# Patient Record
Sex: Female | Born: 1935 | Race: Black or African American | Hispanic: No | Smoking: Current some day smoker
Health system: Southern US, Community
[De-identification: ages and names within clinical notes are randomized; demographics above are authoritative.]

## PROBLEM LIST (undated history)

## (undated) DIAGNOSIS — F32A Depression, unspecified: Secondary | ICD-10-CM

## (undated) DIAGNOSIS — R600 Localized edema: Secondary | ICD-10-CM

## (undated) DIAGNOSIS — M1990 Unspecified osteoarthritis, unspecified site: Secondary | ICD-10-CM

## (undated) DIAGNOSIS — F329 Major depressive disorder, single episode, unspecified: Secondary | ICD-10-CM

## (undated) DIAGNOSIS — M51379 Other intervertebral disc degeneration, lumbosacral region without mention of lumbar back pain or lower extremity pain: Secondary | ICD-10-CM

## (undated) DIAGNOSIS — E039 Hypothyroidism, unspecified: Secondary | ICD-10-CM

## (undated) DIAGNOSIS — J449 Chronic obstructive pulmonary disease, unspecified: Secondary | ICD-10-CM

## (undated) DIAGNOSIS — I2510 Atherosclerotic heart disease of native coronary artery without angina pectoris: Secondary | ICD-10-CM

## (undated) DIAGNOSIS — F419 Anxiety disorder, unspecified: Secondary | ICD-10-CM

## (undated) DIAGNOSIS — F410 Panic disorder [episodic paroxysmal anxiety] without agoraphobia: Secondary | ICD-10-CM

## (undated) DIAGNOSIS — R911 Solitary pulmonary nodule: Secondary | ICD-10-CM

## (undated) DIAGNOSIS — I219 Acute myocardial infarction, unspecified: Secondary | ICD-10-CM

## (undated) DIAGNOSIS — I729 Aneurysm of unspecified site: Secondary | ICD-10-CM

## (undated) DIAGNOSIS — M5137 Other intervertebral disc degeneration, lumbosacral region: Secondary | ICD-10-CM

## (undated) DIAGNOSIS — E669 Obesity, unspecified: Secondary | ICD-10-CM

## (undated) DIAGNOSIS — L89109 Pressure ulcer of unspecified part of back, unspecified stage: Secondary | ICD-10-CM

## (undated) DIAGNOSIS — Z720 Tobacco use: Secondary | ICD-10-CM

## (undated) DIAGNOSIS — I10 Essential (primary) hypertension: Secondary | ICD-10-CM

## (undated) DIAGNOSIS — R569 Unspecified convulsions: Secondary | ICD-10-CM

## (undated) DIAGNOSIS — M6740 Ganglion, unspecified site: Secondary | ICD-10-CM

## (undated) DIAGNOSIS — G40909 Epilepsy, unspecified, not intractable, without status epilepticus: Secondary | ICD-10-CM

## (undated) HISTORY — DX: Major depressive disorder, single episode, unspecified: F32.9

## (undated) HISTORY — DX: Other intervertebral disc degeneration, lumbosacral region: M51.37

## (undated) HISTORY — DX: Obesity, unspecified: E66.9

## (undated) HISTORY — DX: Ganglion, unspecified site: M67.40

## (undated) HISTORY — DX: Localized edema: R60.0

## (undated) HISTORY — DX: Panic disorder (episodic paroxysmal anxiety): F41.0

## (undated) HISTORY — DX: Essential (primary) hypertension: I10

## (undated) HISTORY — DX: Depression, unspecified: F32.A

## (undated) HISTORY — PX: CHOLECYSTECTOMY: SHX55

## (undated) HISTORY — DX: Chronic obstructive pulmonary disease, unspecified: J44.9

## (undated) HISTORY — DX: Atherosclerotic heart disease of native coronary artery without angina pectoris: I25.10

## (undated) HISTORY — DX: Epilepsy, unspecified, not intractable, without status epilepticus: G40.909

## (undated) HISTORY — DX: Acute myocardial infarction, unspecified: I21.9

## (undated) HISTORY — DX: Other intervertebral disc degeneration, lumbosacral region without mention of lumbar back pain or lower extremity pain: M51.379

## (undated) HISTORY — DX: Unspecified osteoarthritis, unspecified site: M19.90

## (undated) HISTORY — PX: APPENDECTOMY: SHX54

## (undated) HISTORY — PX: ANGIOPLASTY: SHX39

## (undated) HISTORY — DX: Anxiety disorder, unspecified: F41.9

## (undated) HISTORY — DX: Aneurysm of unspecified site: I72.9

## (undated) HISTORY — DX: Solitary pulmonary nodule: R91.1

## (undated) HISTORY — DX: Pressure ulcer of unspecified part of back, unspecified stage: L89.109

## (undated) HISTORY — DX: Tobacco use: Z72.0

## (undated) HISTORY — DX: Hypothyroidism, unspecified: E03.9

---

## 2000-11-12 HISTORY — PX: REPLACEMENT TOTAL KNEE: SUR1224

## 2001-02-11 ENCOUNTER — Ambulatory Visit (HOSPITAL_COMMUNITY): Admission: RE | Admit: 2001-02-11 | Discharge: 2001-02-11 | Payer: Self-pay | Admitting: Family Medicine

## 2001-04-30 ENCOUNTER — Inpatient Hospital Stay (HOSPITAL_COMMUNITY): Admission: EM | Admit: 2001-04-30 | Discharge: 2001-05-06 | Payer: Self-pay | Admitting: Internal Medicine

## 2001-04-30 ENCOUNTER — Encounter: Payer: Self-pay | Admitting: Internal Medicine

## 2001-06-10 ENCOUNTER — Other Ambulatory Visit: Admission: RE | Admit: 2001-06-10 | Discharge: 2001-06-10 | Payer: Self-pay | Admitting: Internal Medicine

## 2001-10-28 ENCOUNTER — Emergency Department (HOSPITAL_COMMUNITY): Admission: EM | Admit: 2001-10-28 | Discharge: 2001-10-28 | Payer: Self-pay | Admitting: Emergency Medicine

## 2001-12-05 ENCOUNTER — Encounter: Payer: Self-pay | Admitting: Orthopedic Surgery

## 2001-12-10 ENCOUNTER — Inpatient Hospital Stay (HOSPITAL_COMMUNITY): Admission: RE | Admit: 2001-12-10 | Discharge: 2001-12-16 | Payer: Self-pay | Admitting: Orthopedic Surgery

## 2001-12-16 ENCOUNTER — Inpatient Hospital Stay (HOSPITAL_COMMUNITY)
Admission: AD | Admit: 2001-12-16 | Discharge: 2001-12-21 | Payer: Self-pay | Admitting: Physical Medicine & Rehabilitation

## 2001-12-31 ENCOUNTER — Encounter: Payer: Self-pay | Admitting: Emergency Medicine

## 2001-12-31 ENCOUNTER — Emergency Department (HOSPITAL_COMMUNITY): Admission: EM | Admit: 2001-12-31 | Discharge: 2001-12-31 | Payer: Self-pay | Admitting: Emergency Medicine

## 2002-10-20 ENCOUNTER — Ambulatory Visit (HOSPITAL_COMMUNITY): Admission: RE | Admit: 2002-10-20 | Discharge: 2002-10-20 | Payer: Self-pay | Admitting: Family Medicine

## 2002-12-19 ENCOUNTER — Ambulatory Visit (HOSPITAL_COMMUNITY): Admission: RE | Admit: 2002-12-19 | Discharge: 2002-12-19 | Payer: Self-pay | Admitting: General Surgery

## 2003-04-08 ENCOUNTER — Other Ambulatory Visit: Admission: RE | Admit: 2003-04-08 | Discharge: 2003-04-08 | Payer: Self-pay | Admitting: Gynecologic Oncology

## 2003-04-08 ENCOUNTER — Encounter (INDEPENDENT_AMBULATORY_CARE_PROVIDER_SITE_OTHER): Payer: Self-pay | Admitting: Specialist

## 2003-04-08 ENCOUNTER — Ambulatory Visit: Admission: RE | Admit: 2003-04-08 | Discharge: 2003-04-08 | Payer: Self-pay | Admitting: Gynecologic Oncology

## 2003-04-10 ENCOUNTER — Encounter: Payer: Self-pay | Admitting: Family Medicine

## 2003-04-10 ENCOUNTER — Ambulatory Visit (HOSPITAL_COMMUNITY): Admission: RE | Admit: 2003-04-10 | Discharge: 2003-04-10 | Payer: Self-pay | Admitting: Family Medicine

## 2003-04-13 ENCOUNTER — Encounter: Payer: Self-pay | Admitting: Gynecology

## 2003-04-13 ENCOUNTER — Ambulatory Visit (HOSPITAL_COMMUNITY): Admission: RE | Admit: 2003-04-13 | Discharge: 2003-04-13 | Payer: Self-pay | Admitting: Gynecology

## 2003-04-22 DIAGNOSIS — J984 Other disorders of lung: Secondary | ICD-10-CM | POA: Insufficient documentation

## 2003-05-04 ENCOUNTER — Encounter: Admission: RE | Admit: 2003-05-04 | Discharge: 2003-05-04 | Payer: Self-pay | Admitting: Gynecology

## 2003-05-04 ENCOUNTER — Encounter: Payer: Self-pay | Admitting: Gynecology

## 2003-05-05 ENCOUNTER — Ambulatory Visit (HOSPITAL_BASED_OUTPATIENT_CLINIC_OR_DEPARTMENT_OTHER): Admission: RE | Admit: 2003-05-05 | Discharge: 2003-05-05 | Payer: Self-pay | Admitting: Gynecology

## 2003-05-05 ENCOUNTER — Ambulatory Visit (HOSPITAL_COMMUNITY): Admission: RE | Admit: 2003-05-05 | Discharge: 2003-05-05 | Payer: Self-pay | Admitting: Gynecology

## 2003-05-05 ENCOUNTER — Encounter (INDEPENDENT_AMBULATORY_CARE_PROVIDER_SITE_OTHER): Payer: Self-pay

## 2003-06-01 ENCOUNTER — Inpatient Hospital Stay (HOSPITAL_COMMUNITY): Admission: EM | Admit: 2003-06-01 | Discharge: 2003-06-05 | Payer: Self-pay | Admitting: *Deleted

## 2003-06-01 ENCOUNTER — Encounter: Payer: Self-pay | Admitting: *Deleted

## 2003-06-02 ENCOUNTER — Encounter: Payer: Self-pay | Admitting: Internal Medicine

## 2003-06-03 ENCOUNTER — Encounter: Payer: Self-pay | Admitting: Internal Medicine

## 2003-06-10 ENCOUNTER — Ambulatory Visit (HOSPITAL_COMMUNITY): Admission: RE | Admit: 2003-06-10 | Discharge: 2003-06-10 | Payer: Self-pay | Admitting: Internal Medicine

## 2003-07-16 ENCOUNTER — Ambulatory Visit (HOSPITAL_COMMUNITY): Admission: RE | Admit: 2003-07-16 | Discharge: 2003-07-16 | Payer: Self-pay | Admitting: Unknown Physician Specialty

## 2003-08-25 ENCOUNTER — Inpatient Hospital Stay (HOSPITAL_COMMUNITY): Admission: EM | Admit: 2003-08-25 | Discharge: 2003-08-28 | Payer: Self-pay | Admitting: Emergency Medicine

## 2004-06-20 ENCOUNTER — Ambulatory Visit: Payer: Self-pay | Admitting: Family Medicine

## 2004-07-13 ENCOUNTER — Ambulatory Visit: Payer: Self-pay | Admitting: Family Medicine

## 2004-08-18 ENCOUNTER — Ambulatory Visit: Payer: Self-pay | Admitting: Family Medicine

## 2004-09-06 ENCOUNTER — Ambulatory Visit: Payer: Self-pay | Admitting: Family Medicine

## 2004-10-12 ENCOUNTER — Ambulatory Visit: Payer: Self-pay | Admitting: Family Medicine

## 2004-10-14 ENCOUNTER — Ambulatory Visit (HOSPITAL_COMMUNITY): Admission: RE | Admit: 2004-10-14 | Discharge: 2004-10-14 | Payer: Self-pay | Admitting: Family Medicine

## 2004-11-17 ENCOUNTER — Ambulatory Visit (HOSPITAL_COMMUNITY): Admission: RE | Admit: 2004-11-17 | Discharge: 2004-11-17 | Payer: Self-pay | Admitting: General Surgery

## 2004-11-23 ENCOUNTER — Ambulatory Visit: Payer: Self-pay | Admitting: Family Medicine

## 2004-12-12 ENCOUNTER — Emergency Department (HOSPITAL_COMMUNITY): Admission: EM | Admit: 2004-12-12 | Discharge: 2004-12-13 | Payer: Self-pay | Admitting: *Deleted

## 2005-01-19 ENCOUNTER — Ambulatory Visit: Payer: Self-pay | Admitting: Family Medicine

## 2005-02-17 ENCOUNTER — Ambulatory Visit (HOSPITAL_COMMUNITY): Admission: RE | Admit: 2005-02-17 | Discharge: 2005-02-17 | Payer: Self-pay | Admitting: General Surgery

## 2005-02-17 ENCOUNTER — Encounter (INDEPENDENT_AMBULATORY_CARE_PROVIDER_SITE_OTHER): Payer: Self-pay | Admitting: General Surgery

## 2005-04-18 ENCOUNTER — Ambulatory Visit: Payer: Self-pay | Admitting: Family Medicine

## 2005-04-26 ENCOUNTER — Ambulatory Visit (HOSPITAL_COMMUNITY): Admission: RE | Admit: 2005-04-26 | Discharge: 2005-04-26 | Payer: Self-pay | Admitting: Family Medicine

## 2005-05-23 ENCOUNTER — Ambulatory Visit: Payer: Self-pay | Admitting: Family Medicine

## 2005-06-12 ENCOUNTER — Ambulatory Visit: Payer: Self-pay | Admitting: Family Medicine

## 2005-07-03 ENCOUNTER — Ambulatory Visit: Payer: Self-pay | Admitting: Family Medicine

## 2005-07-25 ENCOUNTER — Ambulatory Visit: Payer: Self-pay | Admitting: Family Medicine

## 2005-08-22 ENCOUNTER — Ambulatory Visit: Payer: Self-pay | Admitting: Family Medicine

## 2005-08-29 ENCOUNTER — Ambulatory Visit (HOSPITAL_COMMUNITY): Admission: RE | Admit: 2005-08-29 | Discharge: 2005-08-29 | Payer: Self-pay | Admitting: Family Medicine

## 2005-09-06 ENCOUNTER — Ambulatory Visit: Payer: Self-pay | Admitting: Orthopedic Surgery

## 2005-10-18 ENCOUNTER — Ambulatory Visit: Payer: Self-pay | Admitting: Family Medicine

## 2005-11-15 ENCOUNTER — Ambulatory Visit: Admission: RE | Admit: 2005-11-15 | Discharge: 2005-11-15 | Payer: Self-pay | Admitting: Family Medicine

## 2006-01-01 ENCOUNTER — Ambulatory Visit: Payer: Self-pay | Admitting: Family Medicine

## 2006-02-26 ENCOUNTER — Ambulatory Visit: Payer: Self-pay | Admitting: Family Medicine

## 2006-03-20 ENCOUNTER — Ambulatory Visit: Payer: Self-pay | Admitting: Family Medicine

## 2006-04-12 ENCOUNTER — Encounter: Payer: Self-pay | Admitting: Emergency Medicine

## 2006-04-12 ENCOUNTER — Ambulatory Visit: Payer: Self-pay | Admitting: Cardiology

## 2006-04-13 ENCOUNTER — Inpatient Hospital Stay (HOSPITAL_COMMUNITY): Admission: AD | Admit: 2006-04-13 | Discharge: 2006-04-17 | Payer: Self-pay | Admitting: Cardiology

## 2006-04-13 ENCOUNTER — Ambulatory Visit: Payer: Self-pay | Admitting: *Deleted

## 2006-05-02 ENCOUNTER — Ambulatory Visit: Payer: Self-pay | Admitting: Cardiology

## 2006-05-16 ENCOUNTER — Ambulatory Visit: Payer: Self-pay | Admitting: Family Medicine

## 2006-06-26 ENCOUNTER — Ambulatory Visit (HOSPITAL_COMMUNITY): Admission: RE | Admit: 2006-06-26 | Discharge: 2006-06-26 | Payer: Self-pay | Admitting: Family Medicine

## 2006-07-25 ENCOUNTER — Ambulatory Visit: Payer: Self-pay | Admitting: Family Medicine

## 2006-09-05 ENCOUNTER — Encounter: Payer: Self-pay | Admitting: Family Medicine

## 2006-09-05 LAB — CONVERTED CEMR LAB
AST: 14 units/L (ref 0–37)
Albumin: 4.7 g/dL (ref 3.5–5.2)
HDL: 74 mg/dL (ref >39)
LDL Cholesterol: 83 mg/dL (ref 0–99)
TSH: 13.758 microintl units/mL — ABNORMAL HIGH (ref 0.350–5.50)
Total Bilirubin: 0.4 mg/dL (ref 0.3–1.2)
Total CHOL/HDL Ratio: 2.3
VLDL: 13 mg/dL (ref 0–40)

## 2006-09-06 ENCOUNTER — Ambulatory Visit: Payer: Self-pay | Admitting: Family Medicine

## 2006-10-23 ENCOUNTER — Ambulatory Visit: Payer: Self-pay | Admitting: Family Medicine

## 2006-10-23 ENCOUNTER — Encounter: Payer: Self-pay | Admitting: Family Medicine

## 2006-10-23 ENCOUNTER — Encounter (INDEPENDENT_AMBULATORY_CARE_PROVIDER_SITE_OTHER): Payer: Self-pay | Admitting: Specialist

## 2006-10-23 ENCOUNTER — Other Ambulatory Visit: Admission: RE | Admit: 2006-10-23 | Discharge: 2006-10-23 | Payer: Self-pay | Admitting: Family Medicine

## 2006-10-29 ENCOUNTER — Ambulatory Visit: Payer: Self-pay | Admitting: Family Medicine

## 2006-10-29 LAB — CONVERTED CEMR LAB
ALT: <8 units/L (ref 0–35)
AST: 12 units/L (ref 0–37)
Alkaline Phosphatase: 128 units/L — ABNORMAL HIGH (ref 39–117)
Basophils Absolute: 0.1 10*3/uL (ref 0.0–0.1)
CO2: 25 meq/L (ref 19–32)
Chloride: 101 meq/L (ref 96–112)
Creatinine, Ser: 0.72 mg/dL (ref 0.40–1.20)
Hemoglobin: 13 g/dL (ref 12.0–15.0)
Indirect Bilirubin: 0.2 mg/dL (ref 0.0–0.9)
Lymphocytes Relative: 17 % (ref 12–46)
Monocytes Absolute: 1.2 10*3/uL — ABNORMAL HIGH (ref 0.2–0.7)
Neutro Abs: 6.1 10*3/uL (ref 1.7–7.7)
RBC: 4.23 M/uL (ref 3.87–5.11)
RDW: 12.7 % (ref 11.5–14.0)
Total Protein: 7.6 g/dL (ref 6.0–8.3)

## 2006-10-30 ENCOUNTER — Encounter: Payer: Self-pay | Admitting: Family Medicine

## 2006-10-30 LAB — CONVERTED CEMR LAB
Cholesterol: 184 mg/dL (ref 0–200)
LDL Cholesterol: 90 mg/dL (ref 0–99)
VLDL: 22 mg/dL (ref 0–40)

## 2006-11-14 ENCOUNTER — Ambulatory Visit: Payer: Self-pay | Admitting: Family Medicine

## 2006-12-18 ENCOUNTER — Encounter: Payer: Self-pay | Admitting: Family Medicine

## 2006-12-18 LAB — CONVERTED CEMR LAB: TSH: 21.187 microintl units/mL — ABNORMAL HIGH (ref 0.350–5.50)

## 2007-01-03 ENCOUNTER — Ambulatory Visit (HOSPITAL_COMMUNITY): Admission: RE | Admit: 2007-01-03 | Discharge: 2007-01-03 | Payer: Self-pay | Admitting: Ophthalmology

## 2007-01-22 ENCOUNTER — Ambulatory Visit: Payer: Self-pay | Admitting: Family Medicine

## 2007-02-13 ENCOUNTER — Ambulatory Visit (HOSPITAL_COMMUNITY): Admission: RE | Admit: 2007-02-13 | Discharge: 2007-02-13 | Payer: Self-pay | Admitting: Ophthalmology

## 2007-02-26 ENCOUNTER — Ambulatory Visit: Payer: Self-pay | Admitting: Family Medicine

## 2007-02-26 LAB — CONVERTED CEMR LAB
Phenytoin Lvl: 7.2 ug/mL — ABNORMAL LOW (ref 10.0–20.0)
TSH: 1.173 microintl units/mL (ref 0.350–5.50)

## 2007-03-14 ENCOUNTER — Ambulatory Visit (HOSPITAL_COMMUNITY): Admission: RE | Admit: 2007-03-14 | Discharge: 2007-03-14 | Payer: Self-pay | Admitting: Ophthalmology

## 2007-04-29 ENCOUNTER — Ambulatory Visit: Payer: Self-pay | Admitting: Family Medicine

## 2007-05-15 ENCOUNTER — Emergency Department (HOSPITAL_COMMUNITY): Admission: EM | Admit: 2007-05-15 | Discharge: 2007-05-16 | Payer: Self-pay | Admitting: Emergency Medicine

## 2007-06-14 ENCOUNTER — Encounter: Payer: Self-pay | Admitting: Family Medicine

## 2007-06-14 LAB — CONVERTED CEMR LAB
AST: 14 units/L
CO2: 24 meq/L
Chloride: 105 meq/L
Cholesterol: 181 mg/dL
Creatinine, Ser: 0.67 mg/dL
Glucose, Bld: 70 mg/dL
LDL Cholesterol: 90 mg/dL
Phenytoin Lvl: 6 ug/mL
Sodium: 146 meq/L
Total Bilirubin: 0.3 mg/dL
Triglycerides: 109 mg/dL
VLDL: 22 mg/dL

## 2007-06-17 ENCOUNTER — Ambulatory Visit: Payer: Self-pay | Admitting: Family Medicine

## 2007-06-17 LAB — CONVERTED CEMR LAB
ALT: 9 units/L (ref 0–35)
AST: 14 units/L (ref 0–37)
BUN: 10 mg/dL (ref 6–23)
Bilirubin, Direct: 0.1 mg/dL (ref 0.0–0.3)
Calcium: 9.5 mg/dL (ref 8.4–10.5)
Cholesterol: 181 mg/dL (ref 0–200)
Creatinine, Ser: 0.67 mg/dL (ref 0.40–1.20)
Glucose, Bld: 70 mg/dL (ref 70–99)
Indirect Bilirubin: 0.2 mg/dL (ref 0.0–0.9)
Total Bilirubin: 0.3 mg/dL (ref 0.3–1.2)

## 2007-08-15 ENCOUNTER — Encounter: Payer: Self-pay | Admitting: Family Medicine

## 2007-08-23 ENCOUNTER — Encounter: Payer: Self-pay | Admitting: Family Medicine

## 2007-08-23 DIAGNOSIS — R042 Hemoptysis: Secondary | ICD-10-CM | POA: Insufficient documentation

## 2007-08-23 DIAGNOSIS — J4489 Other specified chronic obstructive pulmonary disease: Secondary | ICD-10-CM | POA: Insufficient documentation

## 2007-08-23 DIAGNOSIS — F17200 Nicotine dependence, unspecified, uncomplicated: Secondary | ICD-10-CM

## 2007-08-23 DIAGNOSIS — R32 Unspecified urinary incontinence: Secondary | ICD-10-CM

## 2007-08-23 DIAGNOSIS — E669 Obesity, unspecified: Secondary | ICD-10-CM

## 2007-08-23 DIAGNOSIS — M1710 Unilateral primary osteoarthritis, unspecified knee: Secondary | ICD-10-CM

## 2007-08-23 DIAGNOSIS — F410 Panic disorder [episodic paroxysmal anxiety] without agoraphobia: Secondary | ICD-10-CM | POA: Insufficient documentation

## 2007-08-23 DIAGNOSIS — F411 Generalized anxiety disorder: Secondary | ICD-10-CM | POA: Insufficient documentation

## 2007-08-23 DIAGNOSIS — I10 Essential (primary) hypertension: Secondary | ICD-10-CM | POA: Insufficient documentation

## 2007-08-23 DIAGNOSIS — R569 Unspecified convulsions: Secondary | ICD-10-CM | POA: Insufficient documentation

## 2007-08-23 DIAGNOSIS — IMO0002 Reserved for concepts with insufficient information to code with codable children: Secondary | ICD-10-CM | POA: Insufficient documentation

## 2007-08-23 DIAGNOSIS — E039 Hypothyroidism, unspecified: Secondary | ICD-10-CM | POA: Insufficient documentation

## 2007-08-23 DIAGNOSIS — J449 Chronic obstructive pulmonary disease, unspecified: Secondary | ICD-10-CM

## 2007-08-23 DIAGNOSIS — I2510 Atherosclerotic heart disease of native coronary artery without angina pectoris: Secondary | ICD-10-CM | POA: Insufficient documentation

## 2007-11-06 ENCOUNTER — Ambulatory Visit: Payer: Self-pay | Admitting: Cardiology

## 2007-11-18 ENCOUNTER — Ambulatory Visit: Payer: Self-pay

## 2007-11-18 ENCOUNTER — Ambulatory Visit: Payer: Self-pay | Admitting: Cardiology

## 2007-12-18 ENCOUNTER — Inpatient Hospital Stay (HOSPITAL_COMMUNITY): Admission: RE | Admit: 2007-12-18 | Discharge: 2007-12-21 | Payer: Self-pay | Admitting: Orthopedic Surgery

## 2007-12-21 ENCOUNTER — Inpatient Hospital Stay: Admission: AD | Admit: 2007-12-21 | Discharge: 2008-01-20 | Payer: Self-pay | Admitting: Internal Medicine

## 2008-01-03 ENCOUNTER — Ambulatory Visit: Payer: Self-pay

## 2008-02-28 ENCOUNTER — Emergency Department (HOSPITAL_COMMUNITY): Admission: EM | Admit: 2008-02-28 | Discharge: 2008-02-28 | Payer: Self-pay | Admitting: Emergency Medicine

## 2008-02-29 ENCOUNTER — Ambulatory Visit (HOSPITAL_COMMUNITY): Admission: RE | Admit: 2008-02-29 | Discharge: 2008-02-29 | Payer: Self-pay | Admitting: Emergency Medicine

## 2008-04-17 ENCOUNTER — Encounter: Payer: Self-pay | Admitting: Family Medicine

## 2008-07-16 ENCOUNTER — Encounter: Payer: Self-pay | Admitting: Family Medicine

## 2009-01-20 ENCOUNTER — Ambulatory Visit: Payer: Self-pay | Admitting: Cardiology

## 2009-01-21 ENCOUNTER — Telehealth: Payer: Self-pay | Admitting: Cardiology

## 2009-01-27 ENCOUNTER — Encounter: Payer: Self-pay | Admitting: Cardiology

## 2009-01-28 LAB — CONVERTED CEMR LAB
CO2: 25 meq/L (ref 19–32)
Calcium: 10 mg/dL (ref 8.4–10.5)
Creatinine, Ser: 0.74 mg/dL (ref 0.40–1.20)
Glucose, Bld: 93 mg/dL (ref 70–99)

## 2009-05-12 ENCOUNTER — Emergency Department (HOSPITAL_COMMUNITY): Admission: EM | Admit: 2009-05-12 | Discharge: 2009-05-12 | Payer: Self-pay | Admitting: Emergency Medicine

## 2009-05-23 ENCOUNTER — Emergency Department (HOSPITAL_COMMUNITY): Admission: EM | Admit: 2009-05-23 | Discharge: 2009-05-23 | Payer: Self-pay | Admitting: Emergency Medicine

## 2009-08-11 ENCOUNTER — Inpatient Hospital Stay (HOSPITAL_COMMUNITY): Admission: EM | Admit: 2009-08-11 | Discharge: 2009-08-13 | Payer: Self-pay | Admitting: Emergency Medicine

## 2010-02-12 ENCOUNTER — Emergency Department (HOSPITAL_COMMUNITY): Admission: EM | Admit: 2010-02-12 | Discharge: 2010-02-12 | Payer: Self-pay | Admitting: Emergency Medicine

## 2010-02-16 ENCOUNTER — Ambulatory Visit: Payer: Self-pay | Admitting: Cardiology

## 2010-02-16 ENCOUNTER — Inpatient Hospital Stay (HOSPITAL_COMMUNITY): Admission: EM | Admit: 2010-02-16 | Discharge: 2010-02-20 | Payer: Self-pay | Admitting: Emergency Medicine

## 2010-03-22 ENCOUNTER — Encounter: Payer: Self-pay | Admitting: Family Medicine

## 2010-05-18 ENCOUNTER — Encounter (INDEPENDENT_AMBULATORY_CARE_PROVIDER_SITE_OTHER): Payer: Self-pay | Admitting: *Deleted

## 2010-06-17 ENCOUNTER — Inpatient Hospital Stay (HOSPITAL_COMMUNITY): Admission: EM | Admit: 2010-06-17 | Discharge: 2010-06-21 | Payer: Self-pay | Admitting: Emergency Medicine

## 2010-06-17 ENCOUNTER — Ambulatory Visit: Payer: Self-pay | Admitting: Cardiology

## 2010-06-20 ENCOUNTER — Encounter (INDEPENDENT_AMBULATORY_CARE_PROVIDER_SITE_OTHER): Payer: Self-pay | Admitting: Internal Medicine

## 2010-07-11 ENCOUNTER — Encounter (INDEPENDENT_AMBULATORY_CARE_PROVIDER_SITE_OTHER): Payer: Self-pay | Admitting: *Deleted

## 2010-08-25 ENCOUNTER — Inpatient Hospital Stay (HOSPITAL_COMMUNITY): Admission: EM | Admit: 2010-08-25 | Discharge: 2010-08-28 | Payer: Self-pay | Source: Home / Self Care

## 2010-08-29 LAB — BASIC METABOLIC PANEL
BUN: 9 mg/dL (ref 6–23)
BUN: 9 mg/dL (ref 6–23)
CO2: 26 mEq/L (ref 19–32)
CO2: 27 mEq/L (ref 19–32)
Calcium: 9.2 mg/dL (ref 8.4–10.5)
Calcium: 9 mg/dL (ref 8.4–10.5)
Chloride: 104 mEq/L (ref 96–112)
Chloride: 103 mEq/L (ref 96–112)
Creatinine, Ser: 0.67 mg/dL (ref 0.4–1.2)
Creatinine, Ser: 0.73 mg/dL (ref 0.4–1.2)
GFR calc Af Amer: >60 mL/min (ref >60)
GFR calc Af Amer: >60 mL/min (ref >60)
GFR calc non Af Amer: >60 mL/min (ref >60)
GFR calc non Af Amer: >60 mL/min (ref >60)
Glucose, Bld: 86 mg/dL (ref 70–99)
Glucose, Bld: 93 mg/dL (ref 70–99)
Potassium: 3.9 mEq/L (ref 3.5–5.1)
Potassium: 3.9 mEq/L (ref 3.5–5.1)
Sodium: 137 mEq/L (ref 135–145)
Sodium: 138 mEq/L (ref 135–145)

## 2010-08-29 LAB — CBC
HCT: 34.6 % — ABNORMAL LOW (ref 36.0–46.0)
HCT: 33.1 % — ABNORMAL LOW (ref 36.0–46.0)
HCT: 33.2 % — ABNORMAL LOW (ref 36.0–46.0)
HCT: 31.5 % — ABNORMAL LOW (ref 36.0–46.0)
Hemoglobin: 11.7 g/dL — ABNORMAL LOW (ref 12.0–15.0)
Hemoglobin: 11.2 g/dL — ABNORMAL LOW (ref 12.0–15.0)
Hemoglobin: 11.2 g/dL — ABNORMAL LOW (ref 12.0–15.0)
Hemoglobin: 10.7 g/dL — ABNORMAL LOW (ref 12.0–15.0)
MCH: 30.5 pg (ref 26.0–34.0)
MCH: 30.9 pg (ref 26.0–34.0)
MCH: 30.7 pg (ref 26.0–34.0)
MCH: 30.9 pg (ref 26.0–34.0)
MCHC: 33.8 g/dL (ref 30.0–36.0)
MCHC: 33.8 g/dL (ref 30.0–36.0)
MCHC: 33.7 g/dL (ref 30.0–36.0)
MCHC: 34 g/dL (ref 30.0–36.0)
MCV: 90.3 fL (ref 78.0–100.0)
MCV: 91.4 fL (ref 78.0–100.0)
MCV: 91 fL (ref 78.0–100.0)
MCV: 91 fL (ref 78.0–100.0)
Platelets: 245 10*3/uL (ref 150–400)
Platelets: 235 10*3/uL (ref 150–400)
Platelets: 287 10*3/uL (ref 150–400)
Platelets: 278 10*3/uL (ref 150–400)
RBC: 3.83 MIL/uL — ABNORMAL LOW (ref 3.87–5.11)
RBC: 3.62 MIL/uL — ABNORMAL LOW (ref 3.87–5.11)
RBC: 3.65 MIL/uL — ABNORMAL LOW (ref 3.87–5.11)
RBC: 3.46 MIL/uL — ABNORMAL LOW (ref 3.87–5.11)
RDW: 13.2 % (ref 11.5–15.5)
RDW: 13.1 % (ref 11.5–15.5)
RDW: 13.1 % (ref 11.5–15.5)
WBC: 12.9 10*3/uL — ABNORMAL HIGH (ref 4.0–10.5)
WBC: 8.6 10*3/uL (ref 4.0–10.5)
WBC: 6.2 10*3/uL (ref 4.0–10.5)
WBC: 5.4 10*3/uL (ref 4.0–10.5)

## 2010-08-29 LAB — URINE CULTURE
Colony Count: NO GROWTH
Culture  Setup Time: 201201130112
Culture: NO GROWTH

## 2010-08-29 LAB — COMPREHENSIVE METABOLIC PANEL
ALT: 18 U/L (ref 0–35)
ALT: 14 U/L (ref 0–35)
AST: 29 U/L (ref 0–37)
AST: 17 U/L (ref 0–37)
Albumin: 3.8 g/dL (ref 3.5–5.2)
Albumin: 3.4 g/dL — ABNORMAL LOW (ref 3.5–5.2)
Alkaline Phosphatase: 132 U/L — ABNORMAL HIGH (ref 39–117)
Alkaline Phosphatase: 111 U/L (ref 39–117)
BUN: 11 mg/dL (ref 6–23)
BUN: 9 mg/dL (ref 6–23)
CO2: 27 mEq/L (ref 19–32)
CO2: 26 mEq/L (ref 19–32)
Calcium: 9.1 mg/dL (ref 8.4–10.5)
Calcium: 9 mg/dL (ref 8.4–10.5)
Chloride: 102 mEq/L (ref 96–112)
Chloride: 105 mEq/L (ref 96–112)
Creatinine, Ser: 0.7 mg/dL (ref 0.4–1.2)
Creatinine, Ser: 0.66 mg/dL (ref 0.4–1.2)
GFR calc Af Amer: >60 mL/min (ref >60)
GFR calc Af Amer: >60 mL/min (ref >60)
GFR calc non Af Amer: >60 mL/min (ref >60)
GFR calc non Af Amer: >60 mL/min (ref >60)
Glucose, Bld: 96 mg/dL (ref 70–99)
Glucose, Bld: 76 mg/dL (ref 70–99)
Potassium: 4.1 mEq/L (ref 3.5–5.1)
Potassium: 3.8 mEq/L (ref 3.5–5.1)
Sodium: 136 mEq/L (ref 135–145)
Sodium: 139 mEq/L (ref 135–145)
Total Bilirubin: 0.9 mg/dL (ref 0.3–1.2)
Total Bilirubin: 0.8 mg/dL (ref 0.3–1.2)
Total Protein: 7.2 g/dL (ref 6.0–8.3)
Total Protein: 6.4 g/dL (ref 6.0–8.3)

## 2010-08-29 LAB — DIFFERENTIAL
Basophils Absolute: 0 10*3/uL (ref 0.0–0.1)
Basophils Absolute: 0 10*3/uL (ref 0.0–0.1)
Basophils Absolute: 0 10*3/uL (ref 0.0–0.1)
Basophils Absolute: 0 10*3/uL (ref 0.0–0.1)
Basophils Relative: 0 % (ref 0–1)
Basophils Relative: 0 % (ref 0–1)
Basophils Relative: 0 % (ref 0–1)
Basophils Relative: 0 % (ref 0–1)
Eosinophils Absolute: 0.3 10*3/uL (ref 0.0–0.7)
Eosinophils Absolute: 0.5 10*3/uL (ref 0.0–0.7)
Eosinophils Absolute: 0.5 10*3/uL (ref 0.0–0.7)
Eosinophils Absolute: 0.6 10*3/uL (ref 0.0–0.7)
Eosinophils Relative: 3 % (ref 0–5)
Eosinophils Relative: 6 % — ABNORMAL HIGH (ref 0–5)
Eosinophils Relative: 9 % — ABNORMAL HIGH (ref 0–5)
Eosinophils Relative: 10 % — ABNORMAL HIGH (ref 0–5)
Lymphocytes Relative: 24 % (ref 12–46)
Lymphocytes Relative: 24 % (ref 12–46)
Lymphocytes Relative: 40 % (ref 12–46)
Lymphocytes Relative: 45 % (ref 12–46)
Lymphs Abs: 3.1 10*3/uL (ref 0.7–4.0)
Lymphs Abs: 2.1 10*3/uL (ref 0.7–4.0)
Lymphs Abs: 2.5 10*3/uL (ref 0.7–4.0)
Lymphs Abs: 2.4 10*3/uL (ref 0.7–4.0)
Monocytes Absolute: 2.2 10*3/uL — ABNORMAL HIGH (ref 0.1–1.0)
Monocytes Absolute: 1.5 10*3/uL — ABNORMAL HIGH (ref 0.1–1.0)
Monocytes Absolute: 1 10*3/uL (ref 0.1–1.0)
Monocytes Absolute: 0.9 10*3/uL (ref 0.1–1.0)
Monocytes Relative: 17 % — ABNORMAL HIGH (ref 3–12)
Monocytes Relative: 17 % — ABNORMAL HIGH (ref 3–12)
Monocytes Relative: 16 % — ABNORMAL HIGH (ref 3–12)
Monocytes Relative: 16 % — ABNORMAL HIGH (ref 3–12)
Neutro Abs: 7.2 10*3/uL (ref 1.7–7.7)
Neutro Abs: 4.5 10*3/uL (ref 1.7–7.7)
Neutro Abs: 2.2 10*3/uL (ref 1.7–7.7)
Neutro Abs: 1.6 10*3/uL — ABNORMAL LOW (ref 1.7–7.7)
Neutrophils Relative %: 56 % (ref 43–77)
Neutrophils Relative %: 52 % (ref 43–77)
Neutrophils Relative %: 35 % — ABNORMAL LOW (ref 43–77)
Neutrophils Relative %: 29 % — ABNORMAL LOW (ref 43–77)

## 2010-08-29 LAB — PROTIME-INR
INR: 1.57 — ABNORMAL HIGH (ref 0.00–1.49)
INR: 1.78 — ABNORMAL HIGH (ref 0.00–1.49)
INR: 1.97 — ABNORMAL HIGH (ref 0.00–1.49)
INR: 2.03 — ABNORMAL HIGH (ref 0.00–1.49)
Prothrombin Time: 19 seconds — ABNORMAL HIGH (ref 11.6–15.2)
Prothrombin Time: 20.9 seconds — ABNORMAL HIGH (ref 11.6–15.2)
Prothrombin Time: 22.6 seconds — ABNORMAL HIGH (ref 11.6–15.2)
Prothrombin Time: 23.1 seconds — ABNORMAL HIGH (ref 11.6–15.2)

## 2010-08-29 LAB — VITAMIN B12: Vitamin B-12: 299 pg/mL (ref 211–911)

## 2010-08-29 LAB — CARDIAC PANEL(CRET KIN+CKTOT+MB+TROPI)
CK, MB: 1.4 ng/mL (ref 0.3–4.0)
CK, MB: 1.8 ng/mL (ref 0.3–4.0)
CK, MB: 1.9 ng/mL (ref 0.3–4.0)
CK, MB: 1.8 ng/mL (ref 0.3–4.0)
Relative Index: INVALID (ref 0.0–2.5)
Relative Index: INVALID (ref 0.0–2.5)
Relative Index: INVALID (ref 0.0–2.5)
Relative Index: INVALID (ref 0.0–2.5)
Total CK: 40 U/L (ref 7–177)
Total CK: 40 U/L (ref 7–177)
Total CK: 43 U/L (ref 7–177)
Total CK: 50 U/L (ref 7–177)
Troponin I: 0.02 ng/mL (ref 0.00–0.06)
Troponin I: 0.03 ng/mL (ref 0.00–0.06)
Troponin I: 0.07 ng/mL — ABNORMAL HIGH (ref 0.00–0.06)
Troponin I: 0.05 ng/mL (ref 0.00–0.06)

## 2010-08-29 LAB — BRAIN NATRIURETIC PEPTIDE
Pro B Natriuretic peptide (BNP): 151 pg/mL — ABNORMAL HIGH (ref 0.0–100.0)
Pro B Natriuretic peptide (BNP): 136 pg/mL — ABNORMAL HIGH (ref 0.0–100.0)

## 2010-08-29 LAB — URINALYSIS, ROUTINE W REFLEX MICROSCOPIC
Bilirubin Urine: NEGATIVE
Ketones, ur: NEGATIVE mg/dL
Nitrite: NEGATIVE
Protein, ur: 30 mg/dL — AB
Specific Gravity, Urine: 1.01 (ref 1.005–1.030)
Urine Glucose, Fasting: NEGATIVE mg/dL
Urobilinogen, UA: 0.2 mg/dL (ref 0.0–1.0)
pH: 6 (ref 5.0–8.0)

## 2010-08-29 LAB — URINE MICROSCOPIC-ADD ON

## 2010-08-29 LAB — PHENYTOIN LEVEL, TOTAL: Phenytoin Lvl: 10.6 ug/mL (ref 10.0–20.0)

## 2010-08-29 LAB — POCT CARDIAC MARKERS
CKMB, poc: <1 ng/mL — ABNORMAL LOW (ref 1.0–8.0)
Myoglobin, poc: 61.9 ng/mL (ref 12–200)
Troponin i, poc: <0.05 ng/mL (ref 0.00–0.09)

## 2010-08-29 LAB — IRON AND TIBC
Iron: 43 ug/dL (ref 42–135)
Saturation Ratios: 18 % — ABNORMAL LOW (ref 20–55)
TIBC: 244 ug/dL — ABNORMAL LOW (ref 250–470)
UIBC: 201 ug/dL

## 2010-08-29 LAB — TSH: TSH: 3.368 u[IU]/mL (ref 0.350–4.500)

## 2010-08-29 LAB — FOLATE: Folate: 16 ng/mL

## 2010-08-29 LAB — HEPARIN LEVEL (UNFRACTIONATED)
Heparin Unfractionated: 0.19 IU/mL — ABNORMAL LOW (ref 0.30–0.70)
Heparin Unfractionated: 0.5 IU/mL (ref 0.30–0.70)

## 2010-08-29 LAB — FERRITIN: Ferritin: 96 ng/mL (ref 10–291)

## 2010-08-29 LAB — GLUCOSE, CAPILLARY: Glucose-Capillary: 76 mg/dL (ref 70–99)

## 2010-08-29 LAB — APTT: aPTT: 46 seconds — ABNORMAL HIGH (ref 24–37)

## 2010-09-03 ENCOUNTER — Encounter: Payer: Self-pay | Admitting: Family Medicine

## 2010-09-04 ENCOUNTER — Encounter: Payer: Self-pay | Admitting: Family Medicine

## 2010-09-15 NOTE — Letter (Signed)
Summary: Appointment - Reminder 2  [facility]. [address] [place] [address]   Phone: [phone]  Fax: [phone]     July 11, 2010 MRN: [id number]   [name] [address] [place], [place]  [address]   Dear Ms. Szczesniak,  Our records indicate that it is time to schedule a follow-up appointment.  Dr. Daleen Squibb         recommended that you follow up with [place] in   01/2010         . It is very important that we reach you to schedule this appointment. We look forward to participating in your health care needs. Please contact us at the number listed above at your earliest convenience to schedule your appointment.  If you are unable to make an appointment at this time, give [place] a call so we can update our records.     Sincerely,   Glass blower/designer

## 2010-09-15 NOTE — Letter (Signed)
Summary: [facility]   Imported By: Lind Guest 03/23/2010 10:50:37  _____________________________________________________________________  External Attachment:    Type:   Image     Comment:   External Document

## 2010-09-15 NOTE — Letter (Signed)
Summary: rpc chart  rpc chart   Imported By: Curtis Sites 05/04/2010 15:57:14  _____________________________________________________________________  External Attachment:    Type:   Image     Comment:   External Document

## 2010-09-15 NOTE — Letter (Signed)
Summary: Appointment - Reminder 2  [facility]. [address] [place] [address]   Phone: [phone]  Fax: [phone]     May 18, 2010 MRN: [id number]   [name] [address] [place], [place]  [address]   Dear [name],  Our records indicate that it is time to schedule a follow-up appointment.  Dr.   Daleen Squibb       recommended that you follow up with [place] in      01/2010      . It is very important that we reach you to schedule this appointment. We look forward to participating in your health care needs. Please contact us at the number listed above at your earliest convenience to schedule your appointment.  If you are unable to make an appointment at this time, give [place] a call so we can update our records.     Sincerely,   Glass blower/designer

## 2010-10-25 LAB — URINE CULTURE: Colony Count: 75000

## 2010-10-25 LAB — TSH: TSH: 4.18 u[IU]/mL (ref 0.350–4.500)

## 2010-10-25 LAB — CBC
HCT: 33 % — ABNORMAL LOW (ref 36.0–46.0)
HCT: 35.3 % — ABNORMAL LOW (ref 36.0–46.0)
Hemoglobin: 10.9 g/dL — ABNORMAL LOW (ref 12.0–15.0)
Hemoglobin: 11 g/dL — ABNORMAL LOW (ref 12.0–15.0)
MCHC: 33.3 g/dL (ref 30.0–36.0)
MCHC: 33.7 g/dL (ref 30.0–36.0)
MCV: 94.5 fL (ref 78.0–100.0)
Platelets: 259 10*3/uL (ref 150–400)
Platelets: 279 10*3/uL (ref 150–400)
RBC: 3.47 MIL/uL — ABNORMAL LOW (ref 3.87–5.11)
RDW: 13.9 % (ref 11.5–15.5)
RDW: 14 % (ref 11.5–15.5)
WBC: 11.5 10*3/uL — ABNORMAL HIGH (ref 4.0–10.5)
WBC: 12.5 10*3/uL — ABNORMAL HIGH (ref 4.0–10.5)
WBC: 6.3 10*3/uL (ref 4.0–10.5)
WBC: 7.3 10*3/uL (ref 4.0–10.5)

## 2010-10-25 LAB — DIFFERENTIAL
Basophils Absolute: 0.1 10*3/uL (ref 0.0–0.1)
Basophils Absolute: 0.1 10*3/uL (ref 0.0–0.1)
Basophils Relative: 1 % (ref 0–1)
Basophils Relative: 1 % (ref 0–1)
Eosinophils Absolute: 0.1 10*3/uL (ref 0.0–0.7)
Eosinophils Relative: 4 % (ref 0–5)
Lymphocytes Relative: 25 % (ref 12–46)
Lymphocytes Relative: 32 % (ref 12–46)
Lymphocytes Relative: 34 % (ref 12–46)
Lymphocytes Relative: 43 % (ref 12–46)
Lymphs Abs: 2.7 10*3/uL (ref 0.7–4.0)
Lymphs Abs: 3.1 10*3/uL (ref 0.7–4.0)
Monocytes Relative: 16 % — ABNORMAL HIGH (ref 3–12)
Monocytes Relative: 21 % — ABNORMAL HIGH (ref 3–12)
Neutro Abs: 2.2 10*3/uL (ref 1.7–7.7)
Neutro Abs: 3.1 10*3/uL (ref 1.7–7.7)
Neutro Abs: 6.4 10*3/uL (ref 1.7–7.7)
Neutro Abs: 6.6 10*3/uL (ref 1.7–7.7)
Neutrophils Relative %: 35 % — ABNORMAL LOW (ref 43–77)

## 2010-10-25 LAB — URINALYSIS, ROUTINE W REFLEX MICROSCOPIC
Bilirubin Urine: NEGATIVE
Glucose, UA: NEGATIVE mg/dL
Ketones, ur: NEGATIVE mg/dL
Protein, ur: 30 mg/dL — AB
Urobilinogen, UA: 0.2 mg/dL (ref 0.0–1.0)

## 2010-10-25 LAB — D-DIMER, QUANTITATIVE: D-Dimer, Quant: 1.29 ug/mL-FEU — ABNORMAL HIGH (ref 0.00–0.48)

## 2010-10-25 LAB — PROTIME-INR
INR: 1.12 (ref 0.00–1.49)
INR: 1.3 (ref 0.00–1.49)
INR: 1.78 — ABNORMAL HIGH (ref 0.00–1.49)
INR: 1.96 — ABNORMAL HIGH (ref 0.00–1.49)
Prothrombin Time: 20.9 seconds — ABNORMAL HIGH (ref 11.6–15.2)

## 2010-10-25 LAB — PROTEIN S ACTIVITY: Protein S Activity: 85 % (ref 69–129)

## 2010-10-25 LAB — LUPUS ANTICOAGULANT PANEL
DRVVT: 46.5 secs — ABNORMAL HIGH (ref 36.2–44.3)
Lupus Anticoagulant: NOT DETECTED
PTTLA 4:1 Mix: 43.4 secs (ref 30.0–45.6)
dRVVT Incubated 1:1 Mix: 43 secs (ref 36.2–44.3)

## 2010-10-25 LAB — CARDIOLIPIN ANTIBODIES, IGG, IGM, IGA
Anticardiolipin IgA: 1 APL U/mL — ABNORMAL LOW (ref <22)
Anticardiolipin IgG: 4 GPL U/mL — ABNORMAL LOW (ref <23)
Anticardiolipin IgM: 0 MPL U/mL — ABNORMAL LOW (ref <11)

## 2010-10-25 LAB — BASIC METABOLIC PANEL
BUN: 9 mg/dL (ref 6–23)
Creatinine, Ser: 0.87 mg/dL (ref 0.4–1.2)
GFR calc non Af Amer: >60 mL/min (ref >60)

## 2010-10-25 LAB — URINE MICROSCOPIC-ADD ON

## 2010-10-25 LAB — POCT CARDIAC MARKERS: Myoglobin, poc: 54.6 ng/mL (ref 12–200)

## 2010-10-25 LAB — CARDIAC PANEL(CRET KIN+CKTOT+MB+TROPI)
CK, MB: 1.2 ng/mL (ref 0.3–4.0)
Relative Index: INVALID (ref 0.0–2.5)
Troponin I: 0.05 ng/mL (ref 0.00–0.06)

## 2010-10-25 LAB — FACTOR 5 LEIDEN

## 2010-10-25 LAB — BETA-2-GLYCOPROTEIN I ABS, IGG/M/A: Beta-2 Glyco I IgG: 4 G Units (ref <20)

## 2010-10-25 LAB — PHOSPHORUS: Phosphorus: 4.3 mg/dL (ref 2.3–4.6)

## 2010-10-25 LAB — RPR: RPR Ser Ql: NONREACTIVE

## 2010-10-25 LAB — ANTITHROMBIN III: AntiThromb III Func: 129 % — ABNORMAL HIGH (ref 76–126)

## 2010-10-30 LAB — CBC
HCT: 35.6 % — ABNORMAL LOW (ref 36.0–46.0)
HCT: 35.8 % — ABNORMAL LOW (ref 36.0–46.0)
Hemoglobin: 12 g/dL (ref 12.0–15.0)
MCH: 31.2 pg (ref 26.0–34.0)
MCHC: 33.6 g/dL (ref 30.0–36.0)
MCHC: 33.6 g/dL (ref 30.0–36.0)
MCV: 92.9 fL (ref 78.0–100.0)
Platelets: 221 10*3/uL (ref 150–400)
RBC: 3.82 MIL/uL — ABNORMAL LOW (ref 3.87–5.11)
RDW: 13.3 % (ref 11.5–15.5)
WBC: 6.8 10*3/uL (ref 4.0–10.5)

## 2010-10-30 LAB — URINALYSIS, ROUTINE W REFLEX MICROSCOPIC
Glucose, UA: NEGATIVE mg/dL
Hgb urine dipstick: NEGATIVE
Leukocytes, UA: NEGATIVE
Nitrite: NEGATIVE
Protein, ur: NEGATIVE mg/dL
Specific Gravity, Urine: 1.02 (ref 1.005–1.030)
Specific Gravity, Urine: 1.025 (ref 1.005–1.030)
pH: 5 (ref 5.0–8.0)
pH: 6.5 (ref 5.0–8.0)

## 2010-10-30 LAB — LIPID PANEL
Cholesterol: 170 mg/dL (ref 0–200)
HDL: 79 mg/dL (ref >39)
LDL Cholesterol: 83 mg/dL (ref 0–99)
Triglycerides: 40 mg/dL (ref <150)

## 2010-10-30 LAB — CK TOTAL AND CKMB (NOT AT ARMC)
CK, MB: 2.2 ng/mL (ref 0.3–4.0)
Total CK: 51 U/L (ref 7–177)

## 2010-10-30 LAB — DIFFERENTIAL
Basophils Relative: 1 % (ref 0–1)
Basophils Relative: 1 % (ref 0–1)
Eosinophils Absolute: 0.1 10*3/uL (ref 0.0–0.7)
Eosinophils Relative: 1 % (ref 0–5)
Lymphocytes Relative: 38 % (ref 12–46)
Lymphs Abs: 1.9 10*3/uL (ref 0.7–4.0)
Lymphs Abs: 2 10*3/uL (ref 0.7–4.0)
Monocytes Absolute: 0.7 10*3/uL (ref 0.1–1.0)
Monocytes Absolute: 0.9 10*3/uL (ref 0.1–1.0)
Monocytes Relative: 10 % (ref 3–12)
Monocytes Relative: 13 % — ABNORMAL HIGH (ref 3–12)
Neutro Abs: 3 10*3/uL (ref 1.7–7.7)
Neutro Abs: 4.4 10*3/uL (ref 1.7–7.7)
Neutrophils Relative %: 54 % (ref 43–77)

## 2010-10-30 LAB — BASIC METABOLIC PANEL
BUN: 10 mg/dL (ref 6–23)
BUN: 9 mg/dL (ref 6–23)
CO2: 27 mEq/L (ref 19–32)
CO2: 28 mEq/L (ref 19–32)
Calcium: 8.8 mg/dL (ref 8.4–10.5)
Calcium: 9 mg/dL (ref 8.4–10.5)
Calcium: 9 mg/dL (ref 8.4–10.5)
Chloride: 104 mEq/L (ref 96–112)
Creatinine, Ser: 0.56 mg/dL (ref 0.4–1.2)
Creatinine, Ser: 0.61 mg/dL (ref 0.4–1.2)
Creatinine, Ser: 0.64 mg/dL (ref 0.4–1.2)
GFR calc Af Amer: >60 mL/min (ref >60)
GFR calc non Af Amer: >60 mL/min (ref >60)
GFR calc non Af Amer: >60 mL/min (ref >60)
Glucose, Bld: 159 mg/dL — ABNORMAL HIGH (ref 70–99)
Glucose, Bld: 86 mg/dL (ref 70–99)
Potassium: 3.2 mEq/L — ABNORMAL LOW (ref 3.5–5.1)
Potassium: 3.8 mEq/L (ref 3.5–5.1)
Sodium: 136 mEq/L (ref 135–145)

## 2010-10-30 LAB — COMPREHENSIVE METABOLIC PANEL
Albumin: 4.2 g/dL (ref 3.5–5.2)
Alkaline Phosphatase: 162 U/L — ABNORMAL HIGH (ref 39–117)
BUN: 13 mg/dL (ref 6–23)
Creatinine, Ser: 0.8 mg/dL (ref 0.4–1.2)
Potassium: 3.8 mEq/L (ref 3.5–5.1)
Total Protein: 7.3 g/dL (ref 6.0–8.3)

## 2010-10-30 LAB — HEMOGLOBIN A1C
Hgb A1c MFr Bld: 5.7 % — ABNORMAL HIGH (ref <5.7)
Mean Plasma Glucose: 117 mg/dL — ABNORMAL HIGH (ref <117)

## 2010-10-30 LAB — TSH
TSH: 2.074 u[IU]/mL (ref 0.350–4.500)
TSH: 2.098 u[IU]/mL (ref 0.350–4.500)

## 2010-10-30 LAB — T4, FREE: Free T4: 1.03 ng/dL (ref 0.80–1.80)

## 2010-10-30 LAB — URINE MICROSCOPIC-ADD ON

## 2010-10-30 LAB — MAGNESIUM: Magnesium: 1.5 mg/dL (ref 1.5–2.5)

## 2010-10-30 LAB — URINE CULTURE: Culture: NO GROWTH

## 2010-11-11 ENCOUNTER — Ambulatory Visit (INDEPENDENT_AMBULATORY_CARE_PROVIDER_SITE_OTHER): Payer: Medicare Other | Admitting: Urology

## 2010-11-11 DIAGNOSIS — R3129 Other microscopic hematuria: Secondary | ICD-10-CM

## 2010-11-11 DIAGNOSIS — R82998 Other abnormal findings in urine: Secondary | ICD-10-CM

## 2010-11-11 DIAGNOSIS — N952 Postmenopausal atrophic vaginitis: Secondary | ICD-10-CM

## 2010-11-11 DIAGNOSIS — R109 Unspecified abdominal pain: Secondary | ICD-10-CM

## 2010-11-14 LAB — BASIC METABOLIC PANEL
BUN: 8 mg/dL (ref 6–23)
BUN: 9 mg/dL (ref 6–23)
CO2: 26 mEq/L (ref 19–32)
Calcium: 8.9 mg/dL (ref 8.4–10.5)
Chloride: 105 mEq/L (ref 96–112)
Creatinine, Ser: 0.55 mg/dL (ref 0.4–1.2)
Creatinine, Ser: 0.61 mg/dL (ref 0.4–1.2)
GFR calc Af Amer: >60 mL/min (ref >60)
GFR calc non Af Amer: >60 mL/min (ref >60)
GFR calc non Af Amer: >60 mL/min (ref >60)
GFR calc non Af Amer: >60 mL/min (ref >60)
Glucose, Bld: 75 mg/dL (ref 70–99)
Potassium: 4 mEq/L (ref 3.5–5.1)
Sodium: 137 mEq/L (ref 135–145)
Sodium: 140 mEq/L (ref 135–145)

## 2010-11-14 LAB — HEPATIC FUNCTION PANEL
ALT: 15 U/L (ref 0–35)
Albumin: 3.3 g/dL — ABNORMAL LOW (ref 3.5–5.2)
Alkaline Phosphatase: 123 U/L — ABNORMAL HIGH (ref 39–117)
Total Bilirubin: 0.5 mg/dL (ref 0.3–1.2)
Total Protein: 6 g/dL (ref 6.0–8.3)

## 2010-11-14 LAB — CULTURE, BLOOD (ROUTINE X 2)
Culture: NO GROWTH
Culture: NO GROWTH
Report Status: 1032011

## 2010-11-14 LAB — CBC
HCT: 33.7 % — ABNORMAL LOW (ref 36.0–46.0)
Hemoglobin: 11.1 g/dL — ABNORMAL LOW (ref 12.0–15.0)
Hemoglobin: 11.3 g/dL — ABNORMAL LOW (ref 12.0–15.0)
MCV: 93.2 fL (ref 78.0–100.0)
MCV: 93.9 fL (ref 78.0–100.0)
Platelets: 205 10*3/uL (ref 150–400)
Platelets: 241 10*3/uL (ref 150–400)
RBC: 3.55 MIL/uL — ABNORMAL LOW (ref 3.87–5.11)
RDW: 13.4 % (ref 11.5–15.5)
RDW: 13.6 % (ref 11.5–15.5)
WBC: 5.7 10*3/uL (ref 4.0–10.5)
WBC: 5.9 10*3/uL (ref 4.0–10.5)
WBC: 8 10*3/uL (ref 4.0–10.5)

## 2010-11-14 LAB — DIFFERENTIAL
Basophils Absolute: 0 10*3/uL (ref 0.0–0.1)
Basophils Relative: 0 % (ref 0–1)
Eosinophils Absolute: 0.1 10*3/uL (ref 0.0–0.7)
Eosinophils Absolute: 0.2 10*3/uL (ref 0.0–0.7)
Eosinophils Relative: 3 % (ref 0–5)
Lymphocytes Relative: 12 % (ref 12–46)
Lymphocytes Relative: 20 % (ref 12–46)
Lymphocytes Relative: 25 % (ref 12–46)
Lymphs Abs: 0.9 10*3/uL (ref 0.7–4.0)
Lymphs Abs: 1.2 10*3/uL (ref 0.7–4.0)
Lymphs Abs: 1.4 10*3/uL (ref 0.7–4.0)
Monocytes Absolute: 1.2 10*3/uL — ABNORMAL HIGH (ref 0.1–1.0)
Monocytes Absolute: 1.3 10*3/uL — ABNORMAL HIGH (ref 0.1–1.0)
Monocytes Relative: 20 % — ABNORMAL HIGH (ref 3–12)
Neutro Abs: 3.4 10*3/uL (ref 1.7–7.7)
Neutrophils Relative %: 58 % (ref 43–77)
Neutrophils Relative %: 85 % — ABNORMAL HIGH (ref 43–77)

## 2010-11-14 LAB — URINALYSIS, ROUTINE W REFLEX MICROSCOPIC
Ketones, ur: NEGATIVE mg/dL
Nitrite: NEGATIVE
Urobilinogen, UA: 0.2 mg/dL (ref 0.0–1.0)
pH: 5.5 (ref 5.0–8.0)

## 2010-11-14 LAB — PHENYTOIN LEVEL, TOTAL
Phenytoin Lvl: 13.5 ug/mL (ref 10.0–20.0)
Phenytoin Lvl: 14.1 ug/mL (ref 10.0–20.0)

## 2010-11-14 LAB — BRAIN NATRIURETIC PEPTIDE: Pro B Natriuretic peptide (BNP): 243 pg/mL — ABNORMAL HIGH (ref 0.0–100.0)

## 2010-11-17 LAB — BASIC METABOLIC PANEL
BUN: 7 mg/dL (ref 6–23)
CO2: 31 mEq/L (ref 19–32)
Chloride: 105 mEq/L (ref 96–112)
GFR calc non Af Amer: >60 mL/min (ref >60)
Glucose, Bld: 102 mg/dL — ABNORMAL HIGH (ref 70–99)
Potassium: 3.9 mEq/L (ref 3.5–5.1)
Sodium: 139 mEq/L (ref 135–145)

## 2010-11-17 LAB — URINALYSIS, ROUTINE W REFLEX MICROSCOPIC
Bilirubin Urine: NEGATIVE
Glucose, UA: NEGATIVE mg/dL
Hgb urine dipstick: NEGATIVE
Ketones, ur: NEGATIVE mg/dL
Specific Gravity, Urine: 1.01 (ref 1.005–1.030)
pH: 6.5 (ref 5.0–8.0)

## 2010-11-17 LAB — CBC
HCT: 38.2 % (ref 36.0–46.0)
Hemoglobin: 12.9 g/dL (ref 12.0–15.0)
MCHC: 33.8 g/dL (ref 30.0–36.0)
MCV: 94.9 fL (ref 78.0–100.0)
RDW: 13.6 % (ref 11.5–15.5)

## 2010-11-17 LAB — DIFFERENTIAL
Basophils Absolute: 0 10*3/uL (ref 0.0–0.1)
Eosinophils Absolute: 0.2 10*3/uL (ref 0.0–0.7)
Eosinophils Relative: 2 % (ref 0–5)
Lymphocytes Relative: 30 % (ref 12–46)
Monocytes Absolute: 1 10*3/uL (ref 0.1–1.0)

## 2010-11-20 ENCOUNTER — Inpatient Hospital Stay (HOSPITAL_COMMUNITY)
Admission: AD | Admit: 2010-11-20 | Discharge: 2010-11-22 | DRG: 101 | Disposition: A | Discharge status: Home Health | Payer: Medicare Other | Source: Ambulatory Visit | Attending: Internal Medicine | Admitting: Internal Medicine

## 2010-11-20 ENCOUNTER — Emergency Department (HOSPITAL_COMMUNITY): Payer: Medicare Other

## 2010-11-20 DIAGNOSIS — I2510 Atherosclerotic heart disease of native coronary artery without angina pectoris: Secondary | ICD-10-CM | POA: Diagnosis present

## 2010-11-20 DIAGNOSIS — F17200 Nicotine dependence, unspecified, uncomplicated: Secondary | ICD-10-CM | POA: Diagnosis present

## 2010-11-20 DIAGNOSIS — N390 Urinary tract infection, site not specified: Secondary | ICD-10-CM | POA: Diagnosis present

## 2010-11-20 DIAGNOSIS — R031 Nonspecific low blood-pressure reading: Secondary | ICD-10-CM | POA: Diagnosis present

## 2010-11-20 DIAGNOSIS — Z86718 Personal history of other venous thrombosis and embolism: Secondary | ICD-10-CM

## 2010-11-20 DIAGNOSIS — J449 Chronic obstructive pulmonary disease, unspecified: Secondary | ICD-10-CM | POA: Diagnosis present

## 2010-11-20 DIAGNOSIS — J4489 Other specified chronic obstructive pulmonary disease: Secondary | ICD-10-CM | POA: Diagnosis present

## 2010-11-20 DIAGNOSIS — G40909 Epilepsy, unspecified, not intractable, without status epilepticus: Principal | ICD-10-CM | POA: Diagnosis present

## 2010-11-20 DIAGNOSIS — E785 Hyperlipidemia, unspecified: Secondary | ICD-10-CM | POA: Diagnosis present

## 2010-11-20 DIAGNOSIS — Z9181 History of falling: Secondary | ICD-10-CM

## 2010-11-20 DIAGNOSIS — E039 Hypothyroidism, unspecified: Secondary | ICD-10-CM | POA: Diagnosis present

## 2010-11-20 DIAGNOSIS — I10 Essential (primary) hypertension: Secondary | ICD-10-CM | POA: Diagnosis present

## 2010-11-20 LAB — URINE MICROSCOPIC-ADD ON

## 2010-11-20 LAB — DIFFERENTIAL
Eosinophils Relative: 5 % (ref 0–5)
Lymphocytes Relative: 40 % (ref 12–46)
Lymphs Abs: 2.8 10*3/uL (ref 0.7–4.0)
Monocytes Absolute: 1.2 10*3/uL — ABNORMAL HIGH (ref 0.1–1.0)
Monocytes Relative: 17 % — ABNORMAL HIGH (ref 3–12)

## 2010-11-20 LAB — URINALYSIS, ROUTINE W REFLEX MICROSCOPIC
Bilirubin Urine: NEGATIVE
Glucose, UA: 100 mg/dL — AB
Protein, ur: 30 mg/dL — AB
Urobilinogen, UA: 1 mg/dL (ref 0.0–1.0)

## 2010-11-20 LAB — BASIC METABOLIC PANEL
BUN: 23 mg/dL (ref 6–23)
Calcium: 8.3 mg/dL — ABNORMAL LOW (ref 8.4–10.5)
GFR calc non Af Amer: 52 mL/min — ABNORMAL LOW (ref >60)
Glucose, Bld: 89 mg/dL (ref 70–99)

## 2010-11-20 LAB — CBC
HCT: 35.4 % — ABNORMAL LOW (ref 36.0–46.0)
MCH: 29.9 pg (ref 26.0–34.0)
MCHC: 32.5 g/dL (ref 30.0–36.0)
MCV: 91.9 fL (ref 78.0–100.0)
RDW: 13.6 % (ref 11.5–15.5)

## 2010-11-21 LAB — PROTIME-INR
INR: 4.66 — ABNORMAL HIGH (ref 0.00–1.49)
Prothrombin Time: 43.8 seconds — ABNORMAL HIGH (ref 11.6–15.2)

## 2010-11-22 LAB — URINE CULTURE: Culture: NO GROWTH

## 2010-11-22 LAB — TSH: TSH: 0.916 u[IU]/mL (ref 0.350–4.500)

## 2010-12-02 ENCOUNTER — Ambulatory Visit (INDEPENDENT_AMBULATORY_CARE_PROVIDER_SITE_OTHER): Payer: Medicare Other | Admitting: Urology

## 2010-12-02 DIAGNOSIS — N3020 Other chronic cystitis without hematuria: Secondary | ICD-10-CM

## 2010-12-02 DIAGNOSIS — R109 Unspecified abdominal pain: Secondary | ICD-10-CM

## 2010-12-02 DIAGNOSIS — R3129 Other microscopic hematuria: Secondary | ICD-10-CM

## 2010-12-27 NOTE — Op Note (Signed)
NAMEAYRIS, CARANO               ACCOUNT NO.:  [id number]   MEDICAL RECORD NO.:  [id number]          PATIENT TYPE:  INP   LOCATION:  5022                         FACILITY:  [facility]   PHYSICIAN:  Loreta Ave, M.D. DATE OF BIRTH:  1918/08/27   DATE OF PROCEDURE:  12/19/2007  DATE OF DISCHARGE:                               OPERATIVE REPORT   PREOPERATIVE DIAGNOSES:  End-stage degenerative arthritis left knee  underlying osteoporosis.  Flexion contracture, varus alignment.   POSTOPERATIVE DIAGNOSES:  End-stage degenerative arthritis left knee  underlying osteoporosis.  Flexion contracture, varus alignment.   PROCEDURE:  Left total knee replacement Stryker triathlon prosthesis.  Soft tissue balancing medial capsule release.  Cemented pegged posterior  stabilized #5 femoral component.  A cemented #5 tibial component with a  12 mm x 100 mm centralizing stem because of her osteoporosis.  A 9-mm  polyethylene insert.  Resurfacing of the patella with a 32-mm cemented  pegged medial offset patellar component.   SURGEON:  Loreta Ave, MD   ASSISTANT:  Lestine Mount, PA present throughout the entire case necessary  for timely completion of procedure.   ANESTHESIA:  General.   BLOOD LOSS:  Minimal.   TOURNIQUET TIME:  1 hour 30 minutes.   SPECIMENS:  None.   CULTURES:  None.   COMPLICATIONS:  None.   DRESSINGS:  Soft compressor knee immobilizer.   DRAIN:  Hemovac x1.   PROCEDURE:  The patient brought to the operating room and after adequate  anesthesia had been obtained, left knee examined.  Almost 10 degrees of  flexion contracture locked in about 5-7 of Veress, flexion barely 80  degrees.  Stable ligaments.  Tourniquet applied, prepped and draped in  usual sterile fashion.  Exsanguinated with elevation and Esmarch,  tourniquet inflated to 350 mmHg.  Straight incision above the patella  down to tibial tubercle.  Medial arthrotomy going up into a modified  minimally  invasive vastus-splitting incision preserving quad tendon.  Knee exposed.  Remnants of menisci, loose body, periarticular spurs,  cruciate ligaments removed.  Distal femur exposed.  Very osteopenic  throughout.  Intramedullary guide.  Distal cut set at 6 degrees of  valgus measuring anatomy.  A 12-mm resection because of her degree of  flex contracture.  Using epicondylar access, sized, cut, and fitted for  a posterior stabilize, #5 component was found to have good coverage and  fitting.  Proximal tibia exposed.  Extramedullary guide.  Sufficient  resection for 9-mm insert with 0-degree cut.  Size #5 component.  Patella was exposed, posterior 10 mm removed.  Drilled, sized, and  fitted for 32-mm component.  Trials put in place throughout.  I elected  to stay on the tibia to try to offset low proximally because of her  osteoporosis.  All trials assembled after cuts were made.  With the 9-mm  insert, soft tissue balancing of the medial capsular release and  appropriate cuts had full extension, full flexion, good alignment, good  stability with the midportion of the knee and excellent patellofemoral  tracking.  Tibia was marked for appropriate rotation using trials.  All  trials removed.  I then reamed the tibia for the proximal tibial  component as well as for the 12-mm distal stem.  All trials removed.  Copious irrigation with pulse irrigating device.  Cement prepared,  placed on all components, which were firmly seated.  Polyethylene  attached to tibia, the knee reduced.  Once cement hardened, was  reexamined.  I was very pleased with alignment, stability, and  patellofemoral tracking.  Wound irrigated again.  Hemovac placed,  brought out through a separate stab wound.  Arthrotomy closed #1 Vicryl.  Skin and subcutaneous tissue with Vicryl and staples.  Knee injected  with Marcaine.  Hemovac clamped.  Sterile compressive dressing applied.  Tourniquet fully removed.  Knee immobilizer  applied.  Anesthesia  reversed.  Brought to recovery room.  Tolerated surgery well.  No  complications..  Anesthesia reversed.  Brought to recovery room.  Tolerated surgery well.  No complications.      Loreta Ave, M.D.  Electronically Signed     DFM/MEDQ  D:  12/19/2007  T:  12/20/2007  Job:  [id number]

## 2010-12-27 NOTE — Assessment & Plan Note (Signed)
[facility]                            CARDIOLOGY OFFICE NOTE   IOLA, TURRI                      MRN:          [id number]  DATE:11/18/2007                            DOB:          1918/11/17    HISTORY OF PRESENT ILLNESS:  I asked Mrs. Cline to come back today to  discuss the findings of her stress nuclear study.  She had an adenosine  rest stress Myoview today which shows no ischemia.  Her EF is calculated  at 65%.   She has a history of nonobstructive, coronary disease.   On her last visit, she said that she had a cerebral aneurysm.  However,  we have done an extensive research both at [facility]  and also [facility], which is where she says she had this evaluated.  We  can find no records documenting such.  We can not even find a CT of the  head or MRI.   At this point, I think she is low risk for orthopedic surgery.  Of note,  she has had two ophthalmological procedures that require her to be put  to sleep in the last year or so.  She had no problems with these.   DISCHARGE VITAL SIGNS:  Her blood pressure is 120/71, pulse 69 and  regular.   PLAN:  1. Clear for surgery with Dr. Eulah Pont  2. Continue current medications.  3. We will be happy see in the perioperative period if necessary.     Thomas C. Daleen Squibb, MD, [facility]  Electronically Signed    TCW/MedQ  DD: 11/18/2007  DT: 11/19/2007  Job #: [id number]   cc:   Loreta Ave, M.D.  Catalina Pizza, M.D.

## 2010-12-27 NOTE — H&P (Signed)
[name], [name]               ACCOUNT NO.:  [id number]   MEDICAL RECORD NO.:  [id number]          PATIENT TYPE:  ORB   LOCATION:  S115                          FACILITY:  [facility]   PHYSICIAN:  Catalina Pizza, M.D.        DATE OF BIRTH:  Oct 21, 1918   DATE OF ADMISSION:  12/26/2007  DATE OF DISCHARGE:  LH                              HISTORY & PHYSICAL   REASON FOR ADMISSION:  Nursing facility.  The patient just recently had  left knee replacement surgery by Dr. Eulah Pont in [place], and was  admitted to [facility] for further rehab and pain management of  her left knee.  I only briefly saw Ms. [name] 2 times prior to having  the surgery and still collecting all of her information, some of them  will be outlined below.   PAST MEDICAL HISTORY:  1. Coronary artery disease, status post heart attack approximately      1997.  2. Hypertension.  3. Hypothyroidism.  4. Seizure disorder.  5. Anxiety disorder.  6. History of cerebral aneurysm.  7. History of seizure disorder.  8. Mild COPD.  9. History of tobacco abuse.   CURRENT MEDICATIONS:  1. Dilantin 300 mg p.o. nightly.  2. Aspirin 81 mg p.o. daily.  3. Xanax 0.25 mg p.o. nightly p.r.n. for anxiety.  4. Effexor 37.5 mg p.o. q.a.m.  5. Vytorin 10/80 1 tablet q.a.m.  6. Amlodipine 10 mg p.o. daily.  7. Colace 100 mg p.o. b.i.d.  8. Synthroid 175 mcg p.o. daily.  9. Iron sulfate 325 mg p.o. b.i.d.  10.Robaxin 500 mg p.o. q.6 h. p.r.n. for muscle spasms.  11.Norco 7.5/325 1-2 tablets p.o. q.4-6 hours p.r.n. for pain.   REVIEW OF SYSTEMS:  The patient still states she is having pain in her  left knee, but improving.  Denies any chest pain.  No problems with her  breathing; eating and drinking well.  No problems with constipation and  diarrhea.  No significant swelling.  Did have some bruising following  surgery in left leg.   PHYSICAL EXAMINATION:  VITAL SIGNS:  Temperature is 98.2, blood pressure  is 128/74, pulse is  69, respirations 18, no O2 saturation obtained.  GENERAL:  This is an elderly African American female sitting in chair in  no acute distress.  HEENT:  Unremarkable.  No JVD.  No thyromegaly.  LUNGS:  Clear to auscultation bilaterally.  No murmurs appreciated.  ABDOMEN:  Soft, nontender, and nondistended.  Positive bowel sounds.  EXTREMITIES:  Trace lower extremity edema, left greater than right.  Left knee is still bandaged and has staples still in place.  SKIN:  Ecchymoses below left knee area following surgery.  NEUROLOGIC:  The patient is alert and oriented x3.  No focal deficits  noted.   IMPRESSION:  This is an 77 year old African American female who was  admitted to [facility] for further rehab and pain management of left  knee replacement.   ASSESSMENT/PLAN:  1. Left knee replacement.  She is improving slowly with this and      getting better rehab  at this time.  She is proving every day pain      is improving as well.  Goal is to continue with this and pump      therapy if not sooner.  She is to follow up and see Dr. Eulah Pont on      Jan 03, 2008, for further followup from postoperative exam.  2. Hypertension.  We will continue on her current medications.  3. Anxiety and depression, appears to be doing well.  Continue all      meds as well.  4. Hypothyroidism.  Continue on her current dose of levothyroxine.      This was checked in the office and we will review those notes.  5. Coronary artery disease, was seen recently by Dr. Daleen Squibb and cleared      for surgery and no acute change in image.  6. History of seizure.  We will continue on the phenytoin as      previously prescribed, has not had any seizure activity in a long      time.   DISPOSITION:  As mentioned above, we will continue to monitor once the  patient felt to reach maximal rehab potential inpatient, will be okay to  go home and likely need to continue some rehab in home if not as an  outpatient for this knee  replacement, but the patient is making slow  improvement and is only approximately 8 days out from surgery given her  age, it adds to slow process, but appears to be doing well.      Catalina Pizza, M.D.  Electronically Signed     ZH/MEDQ  D:  12/26/2007  T:  12/27/2007  Job:  [id number]

## 2010-12-27 NOTE — Discharge Summary (Signed)
[name], [name]               ACCOUNT NO.:  [id number]   MEDICAL RECORD NO.:  [id number]          PATIENT TYPE:  INP   LOCATION:  5022                         FACILITY:  [facility]   PHYSICIAN:  Loreta Ave, M.D. DATE OF BIRTH:  [birth year]   DATE OF ADMISSION:  12/18/2007  DATE OF DISCHARGE:  12/21/2007                               DISCHARGE SUMMARY   FINAL DIAGNOSES:  1. Status post left total knee replacement for end-stage degenerative      joint disease.  2. Postoperative blood loss anemia.  3. History of cerebral aneurysm.  4. Seizure disorder.  5. Hypertension.  6. Hypothyroidism.  7. Hyperlipidemia.  8. Coronary artery disease with heart catheterization 2007.  9. Chronic obstructive pulmonary disease.  10.Nicotine abuse.   HISTORY OF PRESENT ILLNESS:  An 77 year old black female with history of  end-stage DJD left knee and chronic pain presented to our office for a  preoperative evaluation for total knee replacement.  She had  progressively worsening pain with failed response with conservative  treatment.  Significant decrease in her daily activities due to the  ongoing complaint.  The patient had history of coronary artery disease  and was cleared by Dr. Valera Castle, cardiologist.  She was also given  preoperative clearance by Dr. Barnett Abu for cerebral aneurysm.  Primary care physician is Dr. Catalina Pizza in [place].   PREOPERATIVE LABORATORY:  November 15, 2007:  WBC 6.0, hemoglobin 13.6,  hematocrit 39.9, platelets 272.  PT 13.8, INR 1.0, PTT 28.  Sodium 139,  potassium 3.7, chloride 103, CO2 29, glucose 81, BUN 8, creatinine 0.62,  alk phos 135, AST 17, ALT 13, total protein 6.9, calcium 9.6.  UA  normal.   HOSPITAL COURSE:  On Dec 18, 2007, the patient was taken to the [facility] OR and a left total knee replacement procedure performed.  Surgeon  Loreta Ave, M.D. and assistant Genene Churn. Barry Dienes, P.A.-C.  Anesthesia  general with femoral nerve block.  No  specimens.  EBL minimal.  Tourniquet time 1 hour 38 minutes.  One Hemovac drain placed.  There  were no surgical or anesthesia complications and the patient was  transferred to recovery in stable condition.  Dec 19, 2007, the patient  doing well with good pain control.  WBC 8.9, hematocrit 30.5, hemoglobin  10.4, platelets 200.  Sodium 141, potassium 3.7, chloride 102, CO2 30,  BUN 9, creatinine 0.71, glucose 136.  There was some bleeding through  the dressing.  Calf nontender, neurovascular intact.  Care management  arranging skilled facility placement.  Discontinued Dilaudid PCA.  The  patient was started on Lovenox 40 mg one subcu injection daily x3 days  for postoperative DVT prophylaxis and then will change over to aspirin  81 mg p.o. daily.  Dr. Barnett Abu, neurosurgeon, recommended using low-  dose anticoagulant for postoperative DVT prophylaxis due to her history  of cerebral aneurysm.  Dec 20, 2007, the patient doing well with pain  controlled.  No complaints.  Bed available at [facility] skilled center.  Temperature 98, pulse 100, respirations 18, blood pressure  120/63.  Hematocrit 26.0, hemoglobin 8.9.  Sodium 134, potassium 4.1, chloride  98, CO2 29, BUN 8, creatinine 0.67, glucose 120.  Wound looks good,  staples intact.  No drainage or signs of infection.  Hemovac drain  discontinued.  Calf nontender, neurovascular intact.  Saline-locked IV.  Discontinued Foley.  Spoke with both of the patient's daughters and  advised that she is doing well and ready for transfer Saturday morning  since bed is available.  Started iron sulfate 325 mg p.o. b.i.d. with  meals.  Dec 21, 2007, the patient doing well and pain controlled.  She is  up out of bed with therapy.  Looks much better.  States that she is  ready for transfer.  Temperature 99.1, pulse 98, respirations 18, blood  pressure 130/69.  WBC 10.1, hematocrit 25.4, hemoglobin 8.9, platelets  183.  Sodium 135, potassium 3.8, chloride  98, CO2 28, BUN 8, creatinine  0.68, glucose 100.  Wound looks good, staples intact.  No drainage or  signs of infection.  Calf nontender, neurovascular intact.   DISCHARGE MEDICATIONS:  1. Dilantin 300 mg p.o. q.h.s.  2. Norco 7.5/325 one to two tablets p.o. q.4-6h. p.r.n. for pain.  3. Robaxin 500 mg one tablet p.o. q.6h. p.r.n. for spasms.  4. Aspirin 81 mg p.o. daily x4 weeks postoperatively for DVT      prophylaxis.  5. Xanax 0.25 mg p.o. q.h.s. p.r.n. for anxiety.  6. Effexor 37.5 mg one tablet p.o. q.a.m.  7. Vytorin 10/80 one tablet p.o. q.a.m.  8. Amlodipine 10 mg p.o. daily.  9. Colace 100 mg p.o. b.i.d.  10.Synthroid 175 mcg p.o. daily.  11.Iron sulfate 325 mg p.o. b.i.d. with meals.   CONDITION:  Good and stable.   DISPOSITION:  Transfer to [facility] skilled facility today.   INSTRUCTIONS:  The patient will continue to work with physical therapy  to improve ambulation and knee range of motion and strengthening.  She  is weightbearing as tolerated with walker.  Must use knee immobilizer  when up and ambulating.  She will use aspirin 81 mg p.o. daily x4 weeks  postoperatively for DVT prophylaxis.  She is okay to shower but no tub  soaking.  Daily dressing changes with 4x4, gauze and tape.  Do not apply  any creams or ointments to the wound.  Her primary care physician is Dr.  Catalina Pizza in [place].  If there are any medical issues, please notify  him immediately.  Any questions or concerns regarding her knee, notify  Dr. Eulah Pont at [phone].  Schedule a return office visit in our office 2  weeks postoperatively.  Call for appointment.      Genene Churn. Denton Meek.      Loreta Ave, M.D.  Electronically Signed    JMO/MEDQ  D:  12/21/2007  T:  12/21/2007  Job:  [id number]

## 2010-12-27 NOTE — Assessment & Plan Note (Signed)
[name]                            CARDIOLOGY OFFICE NOTE   [name]                      MRN:          [id number]  DATE:11/06/2007                            DOB:          1919-08-07    I was asked by Dr. Richardson [name] to provide surgical clearance for a total  left knee replacement on [name].   HISTORY OF PRESENT ILLNESS:  [name] is an 77 year old, widowed,  African American female, mother of 59 who comes today with her daughter.  The last time I saw her was in 2007, after a hospitalization for chest  discomfort.  Cardiac catheterization at that time showed nonobstructive  disease with normal left ventricular function.   She is still having some chest tightness and says she gets around very  little.  She also has dyspnea on exertion.   She was in the emergency room at [name] last fall for discomfort.  Her blood work was unremarkable including cardiac enzymes.  Chest x-ray  showed some cardiomegaly.  Chest CT she was negative for pulmonary  embolus.  She has also had carotid Dopplers within the past year that  also showed 60% stenosis.   She reports to me that she has had an aneurysm diagnosed in her head.  She thought this was at [name], but we cannot find a scan.  Apparently, it must be from [name].  We will try to track this down.   She denies any headaches or any neurological symptoms.  She has had no  orthopnea or PND, but does have peripheral edema.  She is very limited  in mobility with her knee.   PAST MEDICAL HISTORY:  1. Seizure disorder which is poorly characterized.  2. Anxiety and depression.  3. Hypothyroidism.  4. Hyperlipidemia.  5. Hypertension.   CURRENT MEDICATIONS:  1. Phenytoin 1 in the morning and 3 at night.  2. Effexor XR 37.5 mg daily.  3. Levothyroxine 75 mcg a day.  4. Alprazolam 0.25 nightly.  5. Vytorin 10/40 daily.  6. Amlodipine 10 mg a day.   ALLERGIES:  She has no known  drug allergies.   FAMILY HISTORY:  Noncontributory.   PAST SURGICAL HISTORY:  1. Appendectomy in the remote past.  2. Tubes were tied in 1955.   SOCIAL HISTORY:  She is widowed.  She lives in [name].  She has 9  children.  Her daughter is with her today.  Unfortunately, she continues  to smoke about a pack of cigarettes a day.  She has COPD.  She carries  seasonal allergies.   REVIEW OF SYSTEMS:  Negative other than HPI.  All systems reviewed.   PHYSICAL EXAMINATION:  VITAL SIGNS:  Blood pressure 132/80.  Her pulse  69 and regular.  Weight 212.  HEENT:  Normocephalic, atraumatic.  Sclerae slightly muddy.  PERLA.  Facial symmetry is normal.  GENERAL:  Alert and oriented.  NECK:  Carotids were equal bilaterally without obvious bruits.  Thyroid  is not enlarged.  Trachea is midline.  LUNGS:  Clear with decreased breath sounds throughout.  There  is no  rhonchi or wheezes.  CARDIAC:  PMI could not be appreciated.  She has large breasts.  There  was no obvious right-sided lift.  Normal S1 and S2.  No gallop, rub or  murmur.  ABDOMEN:  She has a tight corset on and this was not removed.  She has  good bowel sounds.  No tenderness.  No obvious midline bruit.  EXTREMITIES:  2+ edema at the ankles.  Pulses were 1+/4+ to both  dorsalis pedis and posterior tibial.  There was no sign of DVT.  Her  knee has some significant chronic arthritic changes, particularly the  left greater than the right.  SKIN:  Unremarkable.   EKG shows normal sinus rhythm with RSR prime in V1 and V2.  She does  meet criteria for right bundle branch block which appears to be new.   ASSESSMENT:  [name] history is concerning for chest tightness  and dyspnea on exertion, although she is fairly immobile.  She has a  history of nonobstructive disease that has not been objectively assess  in the last couple of years.  She unforeseen continues to smoke quite  heavily and much of this might be just chronic  obstructive pulmonary  disease, but we worry about the risk of progressive coronary disease as  well.   She is also heavily overweight and has hyperlipidemia and has  hypertension.   I am also concerned about this questionable aneurysm found on the head  scan.  We will have to track this down.   PLAN:  1. Adenosine rest stress Myoview to rule out obstructive coronary      disease.  2. Decrease smoking as much as possible and certainly quit before      surgery.  3. Reconsider starting aspirin after her surgery at 81 mg a day.  She      says she just stopped this on her own.  4. Retrieved MRI or CT of the head from [name] before we clear her for      surgery.  I need to review this.     Sara C. Daleen Squibb, MD, [name]  Electronically Signed    TCW/MedQ  DD: 11/06/2007  DT: 11/07/2007  Job #: [id number]   cc:   [name], M.D.

## 2010-12-28 ENCOUNTER — Ambulatory Visit: Payer: Self-pay | Admitting: Cardiology

## 2010-12-28 ENCOUNTER — Encounter: Payer: Self-pay | Admitting: Cardiology

## 2010-12-30 NOTE — Discharge Summary (Signed)
[place]  Patient:    [name], [name] Visit Number: [id number] MRN: [id number]          Service Type: EMS Location: ED Attending Physician:  Annamarie Dawley Dictated by:   Kennieth Rad, M.D. Admit Date:  12/31/2001 Discharge Date: 12/31/2001                             Discharge Summary  ADMITTING DIAGNOSES: 1. Degenerative joint disease, right knee. 2. Chronic obstructive pulmonary disease. 3. High blood pressure. 4. Hypercholesterolemia.  DISCHARGE DIAGNOSES: 1. Degenerative joint disease, right knee. 2. Chronic obstructive pulmonary disease. 3. High blood pressure. 4. Hypercholesterolemia.  COMPLICATIONS:  None.  INFECTIONS:  None.  OPERATION:  Right total knee arthroplasty.  PERTINENT HISTORY:  This is an 77 year old female who has been followed in the office for severe degenerative joint disease involving the right knee with progressive worsening of pain and immobility and giving-way of the right knee.  PHYSICAL EXAMINATION:  Pertinent physical was that of the right knee with valgus deformity, +2 effusion, crepitus medially and laterally, range of motion -- lacks full flexion as well as extension, negative Homans test.  The patient had undergone preop medical evaluation and cardiac evaluation prior to surgery and was approved to undergo surgery.  LABORATORY AND ACCESSORY DATA:  The patient had preop laboratory done and these were also stable.  HOSPITAL COURSE:  The patient underwent right total knee arthroplasty and tolerated procedure quite well.  Postop course:  Moved a tad bit slow due to lack of pain control.  The patient underwent CPM, physical therapy and occupational therapy, Coumadin and IV antibiotics.  The patient progressed well enough with pain control to be transferred to rehab unit and was transferred in stable and satisfactory condition.  The patient is partial weightbearing on the right side and is  continuing with her Coumadin.  FOLLOWUP:  The patient will be seen back in the office on week after discharge from rehab unit. Dictated by:   Kennieth Rad, M.D. Attending Physician:  Annamarie Dawley DD:  01/01/02 TD:  01/03/02 Job: [phone] [id number]

## 2010-12-30 NOTE — Consult Note (Signed)
[name], [name]              ACCOUNT NO.:  [id number]   MEDICAL RECORD NO.:  [id number]          PATIENT TYPE:  OBV   LOCATION:  A217                          FACILITY:  [facility]   PHYSICIAN:  Farris Has. Dorethea Clan, MD  DATE OF BIRTH:  [birth year]   DATE OF CONSULTATION:  04/13/2006  DATE OF DISCHARGE:  04/13/2006                                   CONSULTATION   PRIMARY:  Milus Mallick. Lodema Hong, M.D.   HISTORY OF PRESENT ILLNESS:  Mrs. Gotcher is a woman who was previously  followed by my colleague Dr. Juanito Doom many years ago who has not had routine  cardiovascular followup.  She does have a history of coronary artery  disease, although it is difficult to assess.  We saw her last in the  hospital back in 2005.  Dr. Dietrich Pates evaluated her at that time.  She  presents today to the emergency department at [facility] [place] after an episode of  discomfort in her chest that started about 2 o'clock in the afternoon on  April 12, 2006.  At that time she was sitting watching TV, had the sudden  onset of chest heaviness, profound shortness of breath, diaphoresis with  discomfort radiating into her neck.  She states that she was quite  frightened by it.  She activated her emergency network which is her  daughter, who came to see her and gave her an aspirin.  She is not receive  any nitroglycerin.  They activated 9-1-1 and by the paramedics came after  about 15 minutes, her discomfort had resolved spontaneously.  She says that  she gets chest discomfort on a relatively regular basis and but that the  discomfort had never been this severe.  She does not really think too much  about her discomfort when she has it because she was evaluated a number of  years ago in the hospital and she was told that she did not have coronary  artery disease.  She does see her primary care Nanami Whitelaw on a regular basis  and has not had a cardiovascular evaluation in some time.  She is currently  without chest discomfort.   Denies any PND and orthopnea.  She is relatively  sedentary.  She does not have exertional component to her discomfort.  She  does however have multiple cardiac risk factors including hypertension,  hyperlipidemia, ongoing active tobacco abuse.  Her past medical history also  includes history of a syncopal episode.  She was evaluated at [facility]  back in 2004.  This was felt to be secondary to a seizure disorder.  She has  history of previous evaluation for coronary artery disease with heart  catheterizations in the late 80s.  I do not have the results of those, but  it is documented in previous consults that she had nonobstructive coronary  disease.  She had an echocardiogram done in 2005 which showed normal LV  systolic function.  She has hypothyroidism, hypertension, seizure disorder  and anxiety disorder.  Also has hypercholesterolemia.   MEDICATIONS AT HOME:  1. Xanax 0.5 mg twice a day.  2.  Norvasc 10 mg once a day.  3. Phenytoin; she takes 100 in the morning and 300 at bedtime.  4. Levothyroxine 175 mcg a day.  5. Claritin 180 mg once a day.  6. Tarka 4/240 once a day.  7. Vytorin 10/40 once a day.   SHE HAS NO KNOWN DRUG ALLERGIES.   She has had a knee replacement and a cholecystectomy in the past, both under  general endotracheal anesthetic without any difficulties.   SOCIAL HISTORY:  She lives in [place] by herself.  Her daughter lives very  close and is actively involved in her care.  She has about an 85-pack-year  smoking history.  Denies any alcohol or drugs and still smokes.   FAMILY HISTORY:  Noncontributory.   REVIEW OF SYSTEMS:  Noncontributory.   PHYSICAL EXAMINATION:  She is a very bright and articulate black female in  no apparent distress, alert and oriented x4.  Her pulse is 90, respirations  are 16.  Her blood pressure is 128/73.  She is saturating 96% on 2 liters  nasal cannula.  EXAMINATION OF THE HEAD, EARS, EYES, NOSE AND THROAT:  Unremarkable.  NECK:   Supple.  There is no jugular venous distension or carotid bruits  noted.  Her thyroid is normal size in the midline.  CHEST:  Decreased breath sounds at the bases.  No rales or rhonchi are  noted.  She does have an increased expiratory phase.  I did note some mild  wheezing at the right base.  CARDIOVASCULAR EXAM:  Regular.  I do not hear a murmur.  First and second  heart sounds are normal.  ABDOMEN:  Obese but soft, nontender, normoactive bowel sounds.  LOWER EXTREMITIES:  Without clubbing, cyanosis or edema.  Pulses are  somewhat diminished in her feet.  She does not have bruits in her groin.  NEUROLOGIC EXAM:  Grossly nonfocal.   LABORATORIES:  White blood cell count 6.3, H&H of 12 and 35, platelet count  of 278.  INR is 1, PTT is 35.  D-dimer was 0.67.  Sodium 137, potassium 3.9,  chloride 103, bicarb 27, BUN 12, creatinine 0.8, blood sugar 86.  LFTs were  normal except for a slightly elevated alkaline phosphatase at 128.  Cardiac  enzymes x2 were negative.  Point of cares x2 were negative.  Dilantin level  6.2.  She had a CT angiogram which was a poor study but was interpreted as  no obvious pulmonary emboli.  Her chest x-ray shows cardiomegaly but no  congestive heart failure.   Her electrocardiograms, however, are quite interesting.  She has an EKG this  morning which shows sinus rhythm at a rate of 60 with a normal PR interval.  Her QRS duration is prolonged at 132 milliseconds in a right bundle branch  block configuration.  She has no ischemic ST-T wave changes and her axis is  normal.  However, her presenting EKG to the emergency department shows sinus  rhythm at a rate of 88, in sinus rhythm, with a right bundle branch block  and ST-segment elevation in leads I and aVL with ST-segment depression in  V4, V5 and V6.  When this is reviewed with previous EKGs which were obtained from September 2004, the right bundle branch block is new as are the ST-T  wave changes.  There are  also Q waves in the anterolateral leads as well  indicating a significant change in her electrocardiogram.   ASSESSMENT:  1. Chest pain very similar to  her history of previous myocardial      infarction.  She has had previous evaluations which showed      nonobstructive coronary disease.  This particular episode was quite      profound and required her to have hospitalization.  She has this      history of kind of ongoing chest discomfort.  It is difficult to know,      but she does have multiple cardiac risk factors.  2. History of coronary disease.  It appears to be nonobstructive.  She has      had two perfusion studies, one in 2002 and one in 2005 which have been      not revealing.  Her LV systolic function was normal, but now she has a      new EKG change and even with her cardiac enzymes being negative, this      is of some significance.  3. New right bundle branch block.  Her D-dimer initially was elevated.      She did get a CT scan.  Although not a great quality study, it did not      show significant obstructive pulmonary emboli.  While this diagnosis is      still not completely excluded, I think it is much lower.  I would      anticoagulate her, however.  4. Chronic obstructive pulmonary disease with ongoing tobacco abuse.  5. Hypothyroidism.  She is pending a TSH evaluation.  6. Hypertension which appears to be reasonably well controlled.  7. Hyperlipidemia.  She needs fasting lipids.  8. Seizure disorder.  Her Dilantin level is slightly low and it may be      worthwhile increasing her dose.   RECOMMENDATIONS:  1. Continue to cycle her enzymes.  I would add full-dose Lovenox to her      aspirin therapy.  2. She needs to go to [facility] for a heart catheterization.  I spent      quite a bit of time discussing this with her.  With her advanced age,      it is worthwhile discussing this from the concept of she has a      significantly increased risk of morbidity and  mortality from a surgical      bypass so this would be strictly a diagnostic procedure with a      possibility of potentially some percutaneous revascularization if that      were indicated.  She appears to understand that.  She really does not      want heart surgery done but would be very willing to do a heart      catheterization and angiography.  Her creatinine actually looks pretty      good.  She is probably not at extreme risk for complication associated      with at least a diagnostic angiogram, and as such I think that is      probably a reasonable thing to pursue.  It might be reasonable given      her elevated D-dimer to potentially do a right heart catheterization      just to ensure that right-sided pressures are not particularly      elevated.  Will transport her down to [facility].  I have      discussed this with her primary doctors who agree.     Farris Has. Dorethea Clan, MD  Electronically Signed     JMH/MEDQ  D:  04/13/2006  T:  04/13/2006  Job:  [id number]

## 2010-12-30 NOTE — Consult Note (Signed)
NAMEARVIE, VILLARRUEL                        ACCOUNT NO.:  [id number]   MEDICAL RECORD NO.:  [id number]                   PATIENT TYPE:  INP   LOCATION:  A210                                 FACILITY:  [facility]   PHYSICIAN:  Lyford Bing, M.D.               DATE OF BIRTH:  05-31-1919   DATE OF CONSULTATION:  08/26/2003  DATE OF DISCHARGE:                                   CONSULTATION   PRIMARY CARDIOLOGIST:  Previously Dr. Daleen Squibb.   HISTORY OF PRESENT ILLNESS:  An 77 year old woman with prior evaluations for  chest pain returns with recurrent chest discomfort. Her description is  somewhat vague. She describes moderately severe aching over the entire chest  without associated dyspnea or diaphoresis. There was no radiation. The  patient took Xanax with some improvement. The original onset was upon  awakening at night and the duration was many hours. The discomfort faded  after initial emergency department evaluation. She now reports no  discomfort.   She does have multiple cardiovascular risk factors including hypertension,  hyperlipidemia, obesity, and tobacco use; however, previous coronary  angiography in 1987 and again in 1988 was notable for no significant  obstructive lesions and normal left ventricular systolic function. She  underwent a pharmacologic stress Cardiolite study in January 2002 that was  negative for ischemia.   PAST MEDICAL HISTORY:  Notable for hypothyroidism, COPD, DJD, history of  seizure disorder. She has had multiple surgeries including cholecystectomy,  appendectomy, bilateral tubal ligation, right total knee replacement, and a  D&C.   MEDICATIONS PRIOR TO ADMISSION:  1. Torsemide 20 mg daily.  2. Tarka 2/240 mg daily.  3. Levothyroxine 0.125 mg daily.  4. Atorvastatin 10 mg daily.  5. Mylanta.  6. Dilantin 400 mg q.h.s.   SOCIAL HISTORY:  The patient lives alone in [place]; he is assisted by an aid  for most of the day. He has eight children who are  supportive. A 60-pack  year history of cigarette smoking. Denies excessive alcohol.   FAMILY HISTORY:  Mother died at an age in excess of 100 years.  No prominent  family history of coronary artery disease.   REVIEW OF SYSTEMS:  Chronic nasal discharge; intermittent cough;  intermittent numbness of her hands. All other systems are reviewed and are  negative.   PHYSICAL EXAMINATION:  GENERAL: On exam, an overweight pleasant woman in no  acute distress.  VITAL SIGNS: Temperature 98.8, heart rate 55 and regular, respirations 18,  blood pressure 120/70, weight 215 pounds.  HEENT: Bilateral arcus. No scleral icterus.  NECK: No jugular venous distention; no carotid bruits.  ENDOCRINE: No thyromegaly.  HEMATOPOIETIC: No adenopathy.  SKIN: No significant lesions.  LUNGS: Kyphosis present; clear lung fields.  CARDIAC: Mild basilar systolic ejection murmur; prominent fourth heart  sound.  ABDOMEN: Soft and nontender; normal bowel sounds; no organomegaly.  EXTREMITIES: Trace edema; distal pulses intact.  NEUROMUSCULAR: Symmetric strength and tone.  EKG: Sinus rhythm; nondiagnostic lateral Q-waves; otherwise normal.   IMPRESSION:  Ms. Siglin experiences occasional episodes of chest  discomfort that result in hospital admission and have eventuated in two  prior negative cardiac catheterizations. Her cardiac markers are negative.  Her symptoms have resolved. We will proceed with a stress Cardiolite study,  probably pharmacologic stress, for further assessment. If negative, as  expected, Ms. Brocious can be discharged to permit further assessment from  possible recurrent symptoms. We will continue to follow this nice lady with  Dr. Lodema Hong in the hospital and subsequently in our office.      ___________________________________________                                            Del Muerto Bing, M.D.   RR/MEDQ  D:  08/26/2003  T:  08/26/2003  Job:  [id number]

## 2010-12-30 NOTE — Op Note (Signed)
NAMELEITHA, [name]                        ACCOUNT NO.:  [id number]   MEDICAL RECORD NO.:  [id number]                   PATIENT TYPE:  AMB   LOCATION:  NESC                                 FACILITY:  [facility]   PHYSICIAN:  De Blanch, M.D.         DATE OF BIRTH:  01-04-1919   DATE OF PROCEDURE:  DATE OF DISCHARGE:                                 OPERATIVE REPORT   PREOPERATIVE DIAGNOSIS:  Endometrial mass and cervical stenosis.   POSTOPERATIVE DIAGNOSIS:  Endometrial mass and cervical stenosis.   OPERATION/PROCEDURE:  Hysteroscopy and dilatation and curettage.   SURGEON:  De Blanch, M.D.   ANESTHESIA:  General orotracheal tube.   ESTIMATED BLOOD LOSS:  10 mL.   SURGICAL FINDINGS:  Examination under anesthesia revealed a stenotic upper  vagina.  The cervical os could not be identified initially.  After incision  of the upper vagina, an os was identified.  Uterus sounded approximately 10  cm anteriorly and contained the mucoid material.  Once the mucoid material  was evacuated, hysteroscopy revealed two large endometrial polyps were  pedunculated fibroids.  These had yellow material associated with it.   DESCRIPTION OF PROCEDURE:  The patient was brought to the operating room and  was positioned awake on the operating table in [place] stirrups and it was  comfortable for her given her arthritis and total knee replacement.  General  anesthesia was then induced. The perineum and vagina were prepped with  Betadine and the patient was draped.  A weighted speculum was placed in the  posterior vagina and a Deaver elevated the bladder.  The anterior vagina  near the cervix was grasped with single-tooth tenaculum.  Cervix could not  be identified.  A horizontal incision was made with the #15 blade in the  area where I suspected the cervical os should be located.  Carefully  probing, we were able to identify the endocervical canal and the uterus was  sounded 10 cm  anteriorly.  The cervix was dilated with a 21 Pratt dilator.  Hysteroscope was inserted. The mucoid material was evacuated and then  hysteroscopy completed revealing polyps.  Scope was removed and using a  Randall-Stone forceps, the polyps were removed in multiple pieces.  Sharp  curettage was then performed.  The patient was rehysteroscoped and a small  amount of polypoid material was noted in the lower uterine segment.  The  scope was removed and additional grasping with the Randall-Stone forceps  removed the remainder of the polyp.  The weighted speculum, tenaculum  and Deaver were removed.  The patient was awakened from anesthesia and taken  to the recovery room in satisfactory condition.  Accounting for the  hysteroscopic fluid was a net of 0.  Sponge, needle and instrument counts  were correct x2.  De Blanch, M.D.    DC/MEDQ  D:  05/05/2003  T:  05/05/2003  Job:  [id number]   cc:   Telford Nab, R.N.  [address]  [place], [place] [address]   Sigmund I. Patsi Sears, M.D.  [address]  [place]  [place] [address]  Fax: [phone]

## 2010-12-30 NOTE — H&P (Signed)
   [name], [name]                        ACCOUNT NO.:  [id number]   MEDICAL RECORD NO.:  [id number]                   PATIENT TYPE:  AMB   LOCATION:  DAY                                  FACILITY:  [facility]   PHYSICIAN:  Jerolyn Shin C. Katrinka Blazing, M.D.                DATE OF BIRTH:  September 29, 1918   DATE OF ADMISSION:  DATE OF DISCHARGE:                                HISTORY & PHYSICAL   HISTORY OF PRESENT ILLNESS:  [age]-old female with a history of a  subcutaneous nodule of her left forearm.  This has been present for two  years.  She has not noticed any recent increase in size.  There is some skin  fixation.  The patient is scheduled to have the mass removed in the OR.   PAST HISTORY:  Past history is positive for hypertension, osteoarthritis,  hypothyroidism, seizure disorder, hyperlipidemia.   MEDICATIONS:  1. Paxil CR 12.5 mg daily.  2. Aspirin 81 mg daily.  3. Dilantin 300 mg q.h.s.  4. Lipitor 10 mg q.h.s.  5. Tarka 2/240 mg daily.  6. Xanax 0.5 mg b.i.d.  7. Synthroid 112 mcg daily.   PAST SURGICAL HISTORY:  Surgery includes right total knee replacement.   SOCIAL HISTORY:  Social history reveals that she continues to smoke one pack  of cigarettes per day.  She does not drink.   PHYSICAL EXAMINATION:  VITAL SIGNS:  On examination, blood pressure is  160/70, pulse 80, respirations 20, weight 224 pounds.  HEENT:  Unremarkable.  NECK:  Neck supple without JVD or bruit.  CHEST:  Chest is clear to auscultation.  No rales, rubs, rhonchi or wheezes.  HEART:  Heart regular rate and rhythm, without murmur, gallop or rub.  ABDOMEN:  Abdomen soft, nontender, no masses.  EXTREMITIES:  Extremities reveal a 5-cm fixed nodule of the left forearm  with overlying hyperkeratotic skin, no cellulitis though.  Lower extremities  reveal mild stasis changes of the ankles with 2+ pedal edema.  NEUROLOGIC:  No focal motor, sensory or cerebellar deficit.   IMPRESSION:  1. Cutaneous  nodule, left forearm.  2. Osteoarthritis.  3. Hypertension.  4.     Seizure disorder.  5. Hyperlipidemia.   PLAN:  Excision of mass under MAC.                                               [name]. Katrinka Blazing, M.D.    LCS/MEDQ  D:  12/19/2002  T:  12/19/2002  Job:  [id number]

## 2010-12-30 NOTE — H&P (Signed)
Sara, [name]                        ACCOUNT NO.:  [id number]   MEDICAL RECORD NO.:  [id number]                   PATIENT TYPE:  INP   LOCATION:  A339                                 FACILITY:  [facility]   PHYSICIAN:  Hanley Hays. Dechurch, M.D.           DATE OF BIRTH:  1919/03/31   DATE OF ADMISSION:  06/01/2003  DATE OF DISCHARGE:                                HISTORY & PHYSICAL   The patient is an 77 year old African-American female followed by Dr.  Lodema Hong with a past medical history remarkable for hypertension, morbid  obesity, history of coronary artery disease with anterior wall MI on nuclear  medicine scanning though I do not see evidence of further evaluation,  hyperlipidemia, hypothyroidism, a seizure disorder though she has been  asymptomatic times many years, degenerative joint disease status post knee  replacement.  She presents to the hospital complaining of a 48-hour history  of progressive right lower quadrant abdominal pain that began initially as a  soreness to the point where she was unable to walk and was in significant  distress and brought to the emergency room by her family this morning.  Workup in the emergency room including plain films and CT scan revealed  mildly dilated jejunum and proximal small-bowel ileus, but no bowel wall  changes, fluid collections, air fluid levels or other findings consistent  with those of obstruction.  Incidental finding of the right middle lobe  infiltrate was noted on the CT scan as well.  In the emergency room she was  noted to be febrile in significant pain.  She had no vomiting.  She  tolerated the CT scan without difficulty.  Her laboratory data is pertinent  for a white count of 10 with a left shift, but otherwise normal hepatic  function.  BMP and blood cultures are pending.  Urinalysis was unremarkable.  The patient is being admitted to the hospital for further evaluation.   REVIEW OF SYSTEMS:  Pertinent for nausea  over the past 2 or 3 days but with  no vomiting.  She states that nausea is not uncommon, but this was slightly  worse than usual.  No change in her bowel habits.  She stooled yesterday  with normal stool.  No history of peptic ulcer disease.  She has had an  endoscopy, at some point she thinks, but she has never had a colonoscopy.  She has had no weight changes.  No previous bowel symptoms to this degree.  She has had no chest pain.  She has had an occasional dry cough over the  last 2-3 weeks, but nonproductiveness, shortness of breath, no orthopnea.  No fever or chills until today.  She usually walks with a walker.  She has a  very supportive family, but is fairly independent.  She has had no change in  mental status.  No joint complaints, no skin rashes or changes.  She has a  history  of chronic candidiasis of her hands, but there is no active rash at  this point.   On a recent workup for urinary hesitancy, apparently she had a cystoscopy  and possible urethral dilatation.  She was noted to have an increased  vaginal stripe and has undergone GYN evaluation with D&C.  I do not have  pathology on that yet.   MEDICATIONS:  1. Tarka 2/240 daily.  2. Demodex 20 daily.  3. Lasix p.r.n.  4. Cozaar 50.  5. Dilantin 300 q.h.s.  6. Synthroid 112.  7. Lipitor 10 at h.s.  8. She uses Xanax p.r.n.  9. She denies any over-the-counter products or other medications.  10.      She denies aspirin secondary to GI intolerance.   PAST MEDICAL HISTORY:  Seizure disorder, hypertension, hyperlipidemia,  coronary artery disease, hypothyroidism, chronic cutaneous candidiasis  versus chronic eczema, status post appendectomy, status post  cholecystectomy, status post recent D&C.   PHYSICAL EXAMINATION:  GENERAL:  Exam reveals a well-developed, female who  is alert and appropriate.  She has just received some pain medication, but  is still able to give adequate history.  VITAL SIGNS:  Blood pressure is  138/62, temperature is 101, pulse is 80 and  regular, respirations are unlabored.  HEENT:  Oropharynx is moist.  No thrush.  NECK:  Neck is supple. There is no JVD.  LUNGS:  The lungs are clear anteriorly and posteriorly, though diminished,  secondary to habitus.  ABDOMEN:  The abdomen is morbidly obese.  It is fairly quiet.  She is  moderately tender and slightly distended in the right midabdomen.  No masses  noted.  RECTAL:  Rectal is negative.  EXTREMITIES:  Without clubbing, cyanosis, or edema. She has some  degenerative changes.  No skin, rash, lesions or breakdown is noted.  NEUROLOGIC:  Gait is not tested though she is clearly alert and oriented,  nonfocal exam.   ASSESSMENT:  1. Abdominal pain with ileus without evidence of obstruction with fever and     chills.  Stools are heme negative.  Will treat with antibiotics, bowel     rest, and monitor closely. She does not appear to be in any extremis at     this point.  Concern for bowel wall ischemia, but no findings consistent     with that as of this time. Will treat aggressively and monitor.  2. Question right middle lobe infiltrate on CT scan though clinically no     pneumonia. She has had a dry hacking cough over the last 2-3 weeks. She     is being covered for atypicals with Levaquin and is on Flagyl as well.     Therefore, will not make any changes and monitor.  3. Hypertension.  Will resume her usual medications once her blood pressure     is stable.  4. Recent hysteroscopy/bladder dilatation. We will see if we can find the     pathology.  5. Obesity needs DVT prophylaxis.   PLAN:  The plan was discussed with the patient and the family present in the  room.  They seem to have good understanding.     ___________________________________________                                         Hanley Hays. Josefine Class, M.D.   FED/MEDQ  D:  06/01/2003  T:  06/01/2003  Job:  [id number]

## 2010-12-30 NOTE — H&P (Signed)
[place]. [facility]  Patient:    [name], [name] Visit Number: [id number] MRN: [id number]          Service Type: EMS Location: ED Attending Physician:  Annamarie Dawley Dictated by:   Kennieth Rad, M.D. Admit Date:  12/31/2001 Discharge Date: 12/31/2001                           History and Physical  CHIEF COMPLAINT:  Severe painful deformed right knee.  HISTORY OF PRESENT ILLNESS:  This is an 77 year old female who had been followed over the past several months with severe degenerative joint disease involving the right knee with increased deformity of the knee and increased immobility.  The patient has been treated with anti-inflammatories and use of cane for ambulation as well as pain tablets.  The patients knee has been giving out more and pain persisting, both on weightbearing as well as bedrest.  PAST MEDICAL HISTORY:  Cholecystectomy, appendectomy, hemorrhoidectomy, history of hypercholesterolemia, high blood pressure, thyroid disorder, asthma.  ALLERGIES:  None known.  MEDICATIONS: 1. Lipitor 10 mg. 2. Demadex 20 mg. 3. Tarka 2/240 mg daily. 4. Cozaar 50 mg. 5. Synthroid. 6. Baby aspirin daily. 7. Inhaler -- Atrovent. 8. Torasemide 20 mg. 9. Phenytek 300 mg daily.  HABITS:  None.  FAMILY HISTORY:  Noncontributory.  REVIEW OF SYSTEMS:  Some shortness of breath from COPD.  No urinary or bowel symptoms.  No cardiac symptoms.  PHYSICAL EXAMINATION:  VITAL SIGNS:  Temperature 97.4, pulse 76, respirations 16, blood pressure 130/70.  Height 5 feet 8-1/2 inches.  Weight 234.  HEENT:  Normocephalic.  Eyes:  Conjunctivae and sclerae clear.  NECK:  Supple.  CHEST:  Clear.  CARDIAC:  S1 and S2 regular.  EXTREMITIES:  Right knee:  Effusion +2, medial and lateral; tender medial and lateral compartments; range of motion limited, lacks full flexion as well as extension; marked valgus deformity; negative Homans test; pulses  intact.  IMPRESSION: 1. Degenerative joint disease, right knee. 2. Chronic obstructive pulmonary disease. 3. High blood pressure. 4. Hypercholesterolemia. 5. Thyroid disorder. Dictated by:   Kennieth Rad, M.D. Attending Physician:  Annamarie Dawley DD:  01/01/02 TD:  01/03/02 Job: [id number]

## 2010-12-30 NOTE — Discharge Summary (Signed)
NAMEISRAEL, WUNDER              ACCOUNT NO.:  [id number]   MEDICAL RECORD NO.:  [id number]          PATIENT TYPE:  INP   LOCATION:  4711                         FACILITY:  [facility]   PHYSICIAN:  Everardo Beals. Juanda Chance, MD, FACCDATE OF BIRTH:  [birth year]   DATE OF ADMISSION:  04/13/2006  DATE OF DISCHARGE:  04/17/2006                                 DISCHARGE SUMMARY   PRIMARY CARDIOLOGIST:  Previously Dr. Valera Castle.   PRIMARY CARE PHYSICIAN:  Dr. Syliva Overman in [place], [place].   PRINCIPAL DIAGNOSIS:  Chest pain.   SECONDARY DIAGNOSES:  1. Nonobstructive coronary artery disease.  2. Hypertension.  3. Hypothyroidism.  4. Hyperlipidemia.  5. Seizure disorder.  6. Seasonal allergies.  7. Ongoing tobacco abuse.  8. Chronic obstructive pulmonary disease.   ALLERGIES:  NO KNOWN DRUG ALLERGIES.   PROCEDURE:  Left heart cardiac catheterization.   HISTORY OF PRESENT ILLNESS:  An 77 year old African American female with  prior history of nonobstructive coronary artery disease who presented to  [facility] on April 12, 2006, with complaints of chest pain that  occurred while watching television associated with shortness of breath,  diaphoresis and radiation of the pain to her neck.  She was treated with  aspirin by the EMS with spontaneous resolution of chest discomfort.  She was  admitted to [facility] and ruled out for MI, but was noted to have  a new right bundle branch block on EKG with questionable ST elevation in  inferolateral leads, which subsequently resolved.  Decision was made, given  her chronic risk factors including hypertension, hyperlipidemia, age and  tobacco abuse to transfer to [facility] for cardiac  catheterization.   HOSPITAL COURSE:  She underwent left heart cardiac catheterization this  morning, April 17, 2006, which revealed 30% stenosis in the proximal and  mid RCA and otherwise minor irregularities  throughout the left coronary  artery tree and overall nonobstructive coronary artery disease.  EF was 60%.  We have initiated PPI therapy and otherwise, she will be medically managed.  We have noted that she is taking both Norvasc and Tarka, which contains  verapamil, another calcium channel blocker, and therefore we discontinued  the Tarka and have instead changed her trandolapril to 4 mg b.i.d.  She is  being discharged home this afternoon in satisfactory condition.   The patient's TSH was also noted to be low.  On further questioning, she  apparently has two separate bottles of Synthroid at home, both 150 mcg and  175 mcg.  We advised her to take the 175 mcg dose and she will need follow-  up of TFTs in about 4-6 weeks.   DISCHARGE LABS:  Hemoglobin 11.3, hematocrit 33.2, WBC 6.4, platelets 253.  Sodium 139, potassium 3.9, chloride 108, CO2 28, BUN 12, creatinine 0.7,  glucose 88, calcium 9.4.  Cardiac markers were negative x3.  Total  cholesterol 146, triglycerides 115, HDL 54, LDL 69.  TSH 0.190, free T4  1.43.  Dilantin level 6.2.   DISPOSITION:  The patient is being discharged home today in good condition.  FOLLOW UP PLANS AND APPOINTMENTS:  She is asked to follow up with primary  care physician in approximately 3-4 weeks.  We will arrange follow-up for  her in [place], [place], to occur in about 2 weeks.   DISCHARGE MEDICATIONS:  1. Aspirin 81 mg daily.  2. Vytorin 10/40 mg nightly.  3. Norvasc 10 mg daily.  4. Synthroid 175 mcg daily.  5. Protonix 4 mg daily.  6. Dilantin 100 mg q.a.m. and 30 mg q.p.m.  7. Mavik 4 mg b.i.d.  8. Allegra 180 mg daily.  9. Xanax 0.5 mg b.i.d.   OUTSTANDING LAB STUDIES:  None.   She has been counseled on the importance of smoking cessation.   DURATION OF DISCHARGE ENCOUNTER:  45 minutes including physician time.     ______________________________  Nicolasa Ducking, ANP    ______________________________  Everardo Beals.  Juanda Chance, MD, [facility]    CB/MEDQ  D:  04/17/2006  T:  04/17/2006  Job:  [id number]   cc:   Milus Mallick. Lodema Hong, M.D.

## 2010-12-30 NOTE — Procedures (Signed)
   [name], [name]                        ACCOUNT NO.:  [id number]   MEDICAL RECORD NO.:  [id number]                   PATIENT TYPE:  OUT   LOCATION:  RAD                                  FACILITY:  [facility]   PHYSICIAN:  Vida Roller, M.D.                DATE OF BIRTH:  03-Feb-1919   DATE OF PROCEDURE:  10/22/2002  DATE OF DISCHARGE:                                EKG INTERPRETATION   PROCEDURE:  Ambulatory EKG evaluation.   REASON FOR CONSULTATION:  Palpitations.   RESULTS:  The quality of the study is poor secondary to the inability to  discern P waves on the tracing.  Overall, there is sinus rhythm.  It is  perceived throughout.  There is one episode of a five-beat run of paroxysmal  atrial tachycardia.  There are frequent PVC's, infrequent PAC's.  There is  no asystole.  Maximum heart rate was 115 and minimum heart rate was 48.  The  five-beat run of PAT appears to have a heart rate in the neighborhood of 120  beats a minute.  There is a detailed diary description of multiple symptoms  including palpitations, chest discomfort, and shortness of breath.  There is  no evidence of ST-T wave changes with any of these symptoms nor is there any  significant ST segment depression on any of the ambulatory leads.   IMPRESSION:  1. Single asymptomatic run of paroxysmal atrial tachycardia, question the     clinical significance.  2. Benign palpitations.  3. Atypical chest discomfort.                                               Vida Roller, M.D.    JH/MEDQ  D:  10/22/2002  T:  10/22/2002  Job:  [id number]

## 2010-12-30 NOTE — Discharge Summary (Signed)
[place]. [facility]  Patient:    [name], [name] Visit Number: [id number] MRN: [id number]          Service Type: [facility] Location: [id number] Attending Physician:  Herold Harms Dictated by:   [name], P.A. Admit Date:  12/16/2001 Discharge Date: 12/21/2001   CC:         [name], M.D.  [name], M.D., [place], [place]   Discharge Summary  DISCHARGE DIAGNOSES: 1. Right total knee arthroplasty secondary to degenerative joint disease,    December 10, 2001. 2. Anemia, resolved. 3. Hypokalemia, resolving. 4. Seizure disorder. 5. Hypertension. 6. Hyperlipidemia. 7. Hypothyroidism.  HISTORY OF PRESENT ILLNESS:  An 77 year old female admitted April 29 with end-stage degenerative joint disease of the right knee.  No relief with conservative care.  Underwent a right total knee arthroplasty April 29 per Dr. Myrtie [name].  Placed on Coumadin for deep vein thrombosis prophylaxis, on 50% partial weightbearing.  Hospital course uneventful.  No chest pain, shortness of breath.  Total assistance for transfers, bed mobility.  Moderate assist for ambulation.  Latest INR 1.2, hemoglobin 9.1.  Chest x-ray negative. Admitted for comprehensive rehab program.  PAST MEDICAL HISTORY:  See discharge diagnoses.  PAST SURGICAL HISTORY:  Appendectomy, cholecystectomy, hemorrhoid surgery.  SOCIAL HISTORY:  She does note a history of tobacco use.  She lives alone in [place], [place].  Uses a cane, walker prior to admission, and very sedentary.  One-level home, no steps to entry.  Daughter in area on disability with limited assistance.  PRIMARY PHYSICIAN:  Dr. Mirna [name] of [place], [place].  ALLERGIES:  None.  MEDICATIONS PRIOR TO ADMISSION:  Lipitor, Demadex, Cozaar, Synthroid, baby aspirin, and Mavik, verapamil, and potassium supplement.  HOSPITAL COURSE:  The patient did well while on rehabilitation services with therapies initiated on a  b.i.d. basis.  The following issues were followed during patients rehab course:  Pertaining to [name] right total knee arthroplasty, it remained stable, surgical site healing nicely, staples had been removed, no signs of infection.  She would follow up with Dr. Myrtie [name] and continue with 50% partial weightbearing.  She continued on Coumadin for deep vein thrombosis prophylaxis, and venous Doppler studies prior to her discharge were negative.  She did remain on subcutaneous Lovenox early on in her hospital course until INR was greater than 2.0.  Noted history of seizure disorder.  She continued on her Dilantin, which was her home medications. There was no seizure activity noted.  Blood pressures remained controlled on Cozaar, Demadex, and Mavik.  She had some mild hypokalemia, and her potassium supplement was increased.  She would continue on Zocor for her hyperlipidemia as well as Synthroid for hypothyroidism.  She had no bowel or bladder disturbances.  Overall, for her functional mobility she was close supervision for bed mobility, close supervision transfers and ambulation, supervision all bathing and dressing.  She would receive home health physical, occupational therapy, as well as a nurse.  LABORATORY DATA:  Latest labs showed an INR 2.1.  Hemoglobin 8.6, hematocrit 25.1.  Sodium 141, potassium 3.3, BUN 7, creatinine 0.7.  DISCHARGE MEDICATIONS:  1. Coumadin daily, dose to be established at time of discharge to complete     Coumadin protocol, to be followed by [facility].  2. Trinsicon twice daily.  3. Zocor 20 mg daily.  4. Cozaar 50 mg daily.  5. Demadex 20 mg daily.  6. Synthroid 112 mcg daily.  7.  Dilantin 300 mg at bedtime.  8. Mavik 2 mg daily.  9. Verapamil 240 mg daily. 10. Tylox one or two tablets every four hours as needed for pain. 11. Potassium chloride 20 mEq daily.  DISCHARGE INSTRUCTIONS:  Activity was 50% partial weightbearing.  Diet  was regular.  Wound care:  Cleanse incision daily with warm soap and water.  SPECIAL INSTRUCTIONS:  Resume aspirin therapy after Coumadin completed.  Home health nurse for ongoing Coumadin therapy to complete protocol.  Home health physical and occupational therapy.  Patient should follow up with Dr. Myrtie [name] of orthopedic services as well as Dr. Mirna [name] of [place], [facility], for medical management. Dictated by:   [name], P.A. Attending Physician:  Herold Harms DD:  12/19/01 TD:  12/23/01 Job: [id number]

## 2010-12-30 NOTE — H&P (Signed)
[name], [name]                        ACCOUNT NO.:  [id number]   MEDICAL RECORD NO.:  [id number]                   PATIENT TYPE:  EMS   LOCATION:  ED                                   FACILITY:  [facility]   PHYSICIAN:  Hanley Hays. Dechurch, M.D.           DATE OF BIRTH:  1918/10/04   DATE OF ADMISSION:  08/25/2003  DATE OF DISCHARGE:                                HISTORY & PHYSICAL   HISTORY OF PRESENT ILLNESS:  An 77 year old African American female followed  by Milus Mallick. Lodema Hong, M.D., with a past medical history remarkable for  hypertension, hypothyroidism, obesity, hyperlipidemia, and ongoing tobacco  abuse, who presented to the emergency room with substernal chest pain that  awakened her from sleep.  She states the pain was severe and she prayed to  be taken.  She did not have any nitroglycerin, so she took a Xanax and the  pain was relieved enough to the point that she was able to rest until her  afternoon came in this morning and they brought her to the emergency room  where she is now pain-free.  The pain lasted for about three to four hours.  She has had similar pains in the past, which was relieved with  nitroglycerin.  She did not receive any nitroglycerin in the emergency room.  She is fairly sedentary.  The pain was not exacerbated by activity or  position.  She had no associated radiation, nausea, or vomiting.  She did  feel as though she had to burp, but could not.  She had no diaphoresis or  shortness of breath.  The patient also lost  her 75 year old mother about  six weeks prior and has had significant grief.  She notes poor sleep, poor  appetite, and lack of attention.  She has had increased anxiety attacks.  She claims that she has a seizure disorder with the question of complex  partial seizure, though a recent EEG was unremarkable.   MEDICATIONS:  Her medications currently include:  1. Demadex 20 mg daily.  2. Tarka 2/240 mg daily.  3. Synthroid 125 mcg  daily.  4. Lipitor 10 mg at h.s.  5. Mylanta Gas 80 t.i.d.  6. Dilantin 400 mg at h.s.   PAST MEDICAL HISTORY:  1. COPD, tobacco abuse.  2. Hypothyroidism.  3. History of seizure disorder, though none recently.  4. Anxiety disorder.  5. Hypertension.  6. Obesity.  7. History of eczema.  8. Hyperlipidemia.  9. History of anterior wall MI on nuclear medicine scanning, but no further     workup.  This was about 2001 by report.  10.      DJD with chronic pain.   PAST SURGICAL HISTORY:  1. Appendectomy.  2. Cholecystectomy.  3. D&C for benign endometrial biopsy.  4. Status post knee replacement in 2002 without incident.   ALLERGIES:  None known.   FAMILY MEDICAL HISTORY:  Her mother lied  to 104.  Noncontributory.   SOCIAL HISTORY:  The patient lives alone.  She has an aid for most of the  day.  She has a very supportive family with eight children, three of whom  are present today.   PHYSICAL EXAMINATION:  VITAL SIGNS:  Blood pressure 150/78, pulse 74 and  regular, respirations unlabored.  GENERAL APPEARANCE:  She has an occasional cough.  She has poor eye contact.  Very flat affect.  She is tearful at times.  NECK:  Supple.  No JVD.  LUNGS:  Clear to auscultation.  HEART:  There is no murmur or gallop noted.  ABDOMEN:  Obese, soft, and nontender.  EXTREMITIES:  Without cyanosis, clubbing, or edema.  The feet are warm.  There are palpable pulses bilaterally in the dorsalis pedis.  SKIN:  Without rash, lesion, or breakdown.  NEUROLOGIC:  Nonfocal.  She is appropriate and alert.  Appears depressed.   ASSESSMENT AND PLAN:  1. Chest pain at rest in a patient with known risk factors of heart disease,     who has not had any recent evaluation.  She also is dealing with a grief     reaction due to her mother's passing.  She will be admitted with an     echocardiogram, rule out enzymes, and 12-lead EKG in the morning.     Cardiology consult to assist with nonstress nuclear  medicine scanning.     Further workup as dictated.  2. Probable depression with increased use of Xanax.  Will begin Lexapro and     monitor.  Will continue her usual medications.  Obtain records from Dr.     Lodema Hong regarding recent lipid profile, etc.  Continue her level of     supportive care.     ___________________________________________                                         Hanley Hays. Josefine Class, M.D.   FED/MEDQ  D:  08/25/2003  T:  08/25/2003  Job:  [id number]

## 2010-12-30 NOTE — Discharge Summary (Signed)
[name], [name]                          ACCOUNT NO.:  [id number]   MEDICAL RECORD NO.:  [id number]                  PATIENT TYPE:   LOCATION:                                       FACILITY:   PHYSICIAN:  Sara Hays. [name], M.D.           DATE OF BIRTH:   DATE OF ADMISSION:  08/24/2002  DATE OF DISCHARGE:  08/27/2002                                 DISCHARGE SUMMARY   DIAGNOSES:  1. Atypical chest pain.  2. Hypertension.  3. History of hyperlipidemia.  4. Morbid obesity.  5. Degenerative joint disease.  6. Depression, reactive.  7. Anxiety disorder.  8. History of seizure disorder.  9. Hypothyroidism.  10.      Chronic obstructive pulmonary disease exacerbation.   DISPOSITION:  The patient discharged to home and follow up with Dr. Lodema Hong  in 1-2 weeks.   MEDICATIONS:  1. Synthroid 125 mcg daily.  2. Dilantin 400 mg at h.s.  3. Tarka 2/240 daily.  4. Lipitor 10 mg at h.s.  5. Mylanta status post meals as needed.  6. Aspirin 81 mg daily.  7. Xanax 0.5 q.6h. p.r.n. anxiety.  8. Lexapro 10 mg daily.  9. Advair 250/50 one puff b.i.d.  10.      Prednisone taper 20 mg x3 days; 10 mg x3 days; and 5 mg x2 days     then discontinue.  11.      Protonix 40 daily.   HOSPITAL COURSE:  An 77 year old African-American female who lives  independently with the assistance of a __________ worker during the day and  supportive family presented to the emergency room complaining of chest pain  that began about 0200 on the day of admission.  It was severe, substernal  without radiation that persisted.  She was brought via EMS once her aid  worker returned.  She had a life line but did not use it as she was  concerned that people would not be able to get into the house.   In any event, the patient remained hemodynamically stable.  She had no pain  at the time of presentation to the emergency room.  Her enzymes were normal.  Cardiology consultation was obtained. The patient  underwent an adenosine  Cardiolite.  She was initially attempted to do stress Cardiolite, but was  unable to use the treadmill.  She had an anxiety reaction during the  adenosine study, but actually tolerated it fine.  Fortunately the adenosine  study revealed no evidence of ischemia.  There was a subtle area of  attenuation thought due to breast shadow per cardiology review.   The patient had an echocardiogram done as well.  The echocardiogram revealed  normal internal dimension of the left ventricle.  Hypertrophy is present  with disproportion involving the septum but no evidence of obstruction.  She  had some mild aortic sclerosis, but a fairly unremarkable study which had no  changes compared with that of  March 2001.  The patient also complained of a  severe cough.  Her chest x-ray revealed no evidence of infiltrate or acute  disease.  She did have some rhonchi throughout.  It was felt that she was  having an exacerbation of her COPD which may or may not have been  precipitated by an upper respiratory infection in this elderly smoker.  In  any event she was counseled on smoking, stared on her prednisone and Advair  and actually had a good response.  She was much improved on the day of  discharge and ready for discharge home.  It also turns out that the patient  has been dealing with significant grief related to her 92 year old mother's  death about 5-6 weeks ago.  She has been started on Lexapro according to  office notes, but patient does not recall being on any Lexapro. It was  started here in the hospital, tolerated the medication well and again she is  being discharged with the plan as noted above.  The reader is referred to  the H&P for physical exam and other pertinence in this patient's medical  history.     ___________________________________________                                         [name], M.D.   FED/MEDQ  D:  08/28/2003  T:  08/28/2003  Job:   [id number]   cc:   Milus Mallick. Lodema Hong, M.D.  [address]  [place], [place] [address]  Fax: [phone]

## 2010-12-30 NOTE — Procedures (Signed)
[name], [name]              ACCOUNT NO.:  [id number]   MEDICAL RECORD NO.:  [id number]          PATIENT TYPE:  INP   LOCATION:  A217                          FACILITY:  [facility]   PHYSICIAN:  Edward L. Juanetta Gosling, M.D.DATE OF BIRTH:  1919-08-07   DATE OF PROCEDURE:  04/13/2006  DATE OF DISCHARGE:  04/13/2006                                EKG INTERPRETATION   0530 at 04/13/2006   The rhythm is sinus rhythm with a sinus arrhythmia.  There is probable right  bundle branch block.  There may be septal infarction but this could be  related to the bundle branch block.  Abnormal electrocardiogram.      Oneal Deputy. Juanetta Gosling, M.D.  Electronically Signed     ELH/MEDQ  D:  04/17/2006  T:  04/17/2006  Job:  [id number]

## 2010-12-30 NOTE — Cardiovascular Report (Signed)
NAMEGRIZELDA, PISCOPO              ACCOUNT NO.:  [id number]   MEDICAL RECORD NO.:  [id number]          PATIENT TYPE:  INP   LOCATION:  4711                         FACILITY:  [facility]   PHYSICIAN:  Everardo Beals. Juanda Chance, MD, FACCDATE OF BIRTH:  10/17/1918   DATE OF PROCEDURE:  04/17/2006  DATE OF DISCHARGE:                              CARDIAC CATHETERIZATION   Primary care physician is Dr. Syliva Overman.   CLINICAL HISTORY:  Ms. Cerino is 77 years old.  She has a history of  hypertension and coronary disease and according to her, she had an  angioplasty done about 8 years ago, but we do not have records of that.  She  recently developed chest pain lasting for about a half an hour and was  admitted to the hospital with a diagnosis of possible unstable angina.  She  had an elevated D-dimer and had a CT scan which did not show any evidence of  pulmonary embolism.   PROCEDURE:  The procedure was performed through the right femoral  __________ coronary catheters.  A __________ was performed and Omnipaque  contrast was used.  __________ was found to rule out renovascular causes for  hypertension.  The patient tolerated the procedure well and left the  laboratory in satisfactory condition.   RESULTS:  The left main __________   The left anterior descending artery __________ artery gave rise to large and  small diagonal branches and three septal perforators.  There was  irregularity in the mid and distal LAD but no significant obstruction.   The __________ gave rise to an atrial branch and two marginal branches, a  second atrial branch and two posterolateral branches.  These vessels were  free of significant disease.   The __________ was a moderate-sized vessel that gave rise to a conus branch,  two right ventricular branches, a __________ branch and two posterolateral  branches.  There were tandem 30% stenoses in the mid-right coronary artery.  There were irregularities in the distal  vessel.   The left ventricle __________  showed good wall motion with no areas of  hypokinesis.  Estimated ejection fraction was 60%.   __________ was performed which showed patent renal arteries.  There was  ectasia of both iliac arteries.  The aortic pressure was 129/61 with mean of  88 and the left ventricular pressure was 129/16.   CONCLUSION:  1. Nonobstructive coronary artery disease with irregularities in the mid      and distal LAD, no significant obstruction of circumflex artery, tandem      30% stenosis in the mid-right coronary artery and normal LV function.  2. Ectasia of both iliac arteries.   RECOMMENDATIONS:  The patient has only mild nonobstructive coronary disease.  I think it is unlikely her symptoms are related to myocardial ischemia.  Will discuss with Dr. Lodema Hong regarding possible further evaluation of her  symptoms and we may give her an empiric trial of antireflux treatment.           ______________________________  Everardo Beals. Juanda Chance, MD, [facility]     BRB/MEDQ  D:  04/17/2006  T:  04/17/2006  Job:  [id number]   cc:   Thomas C. Daleen Squibb, MD, [facility]  Margaret E. Lodema Hong, M.D.

## 2010-12-30 NOTE — H&P (Signed)
[name], TANGO              ACCOUNT NO.:  [id number]   MEDICAL RECORD NO.:  [id number]          PATIENT TYPE:  AMB   LOCATION:  DAY                           FACILITY:  [facility]   PHYSICIAN:  Jerolyn Shin C. Katrinka Blazing, M.D.   DATE OF BIRTH:  03-08-1919   DATE OF ADMISSION:  DATE OF DISCHARGE:  LH                                HISTORY & PHYSICAL   HISTORY OF PRESENT ILLNESS:  An 77 year old female referred for upper and  lower endoscopy because the patient has a history of recurrent lower  abdominal pain with some indigestion. She was noted to have suprapubic  tenderness. CT of her abdomen showed thickening of the gastric antrum and CT  of her pelvis showed no acute abnormality. The patient has not had  colonoscopy. She will have upper and lower endoscopy.   PAST MEDICAL HISTORY:  She has hypertension, general anxiety disorder,  seizure disorder, hyperlipidemia, hypothyroidism, osteoarthritis.   PAST SURGICAL HISTORY:  Right total knee replacement.   MEDICATIONS:  Sonata 10 mg q.h.s., Levoxyl 0.125 mg q.d., Lortab 650 q.d.  p.r.n., Crestor 40 mg q.d., Dilantin 100 mg q.d., Norvasc 10 mg q.d., Tarka  240 q.d., Xanax 0.5 mg b.i.d., Paxil 10 mg q.d., Cardizem C.D. 540 mg. q.d.   PHYSICAL EXAMINATION:  GENERAL:  She is a large framed, elderly female in no  acute distress.  VITAL SIGNS:  Blood pressure 140/78, pulse 70, respiratory rate 20, weight  240 pounds.  HEENT:  Unremarkable.  NECK:  Supple. No jugular venous distention, bruits, adenopathy, or  thyromegaly.  CHEST:  Clear to auscultation.  HEART:  Regular rate and rhythm. Without murmur, rub, or gallop.  ABDOMEN:  Mild suprapubic tenderness. No masses. Normal active bowel sounds.  EXTREMITIES:  No clubbing, cyanosis, or edema. Decreased range of motion of  hip joints and knees with peripheral pulses.  NEUROLOGIC:  No focal, motor, sensory, or cerebellar deficits.   IMPRESSION:  1.  Chronic lower abdominal pain.  2.  CT evidence of  thickening of the gastric wall.  3.  Hypertension.  4.  Seizure disorder.  5.  Hyperlipidemia.  6.  Hypothyroidism.  7.  Osteoarthritis.   PLAN:  The patient will have an upper and lower endoscopy.      LCS/MEDQ  D:  11/16/2004  T:  11/16/2004  Job:  [id number]

## 2010-12-30 NOTE — Discharge Summary (Signed)
NAMEJERNIE, [name]                        ACCOUNT NO.:  [id number]   MEDICAL RECORD NO.:  [id number]                   PATIENT TYPE:  INP   LOCATION:  A339                                 FACILITY:  [facility]   PHYSICIAN:  Hanley Hays. Dechurch, M.D.           DATE OF BIRTH:  1919-03-08   DATE OF ADMISSION:  06/01/2003  DATE OF DISCHARGE:  06/05/2003                                 DISCHARGE SUMMARY   DIAGNOSES:  1. Ileus, resolved.  2. Possible viral gastroenteritis, resolved.  3. Right perihilar infiltrate on x-ray.  4. Ongoing tobacco abuse.  5. Hypokalemia, corrected.  6. Hypothyroidism, TSH 7.5.  7. History of seizure disorder with question complex partial seizure.  8. Hypertension.  9. Obesity.  10.      Hyperlipidemia.  11.      History of eczema.  12.      Status post appendectomy, cholecystectomy, and recent dilation and     curettage with benign endometrial biopsy.  13.      History of anterior wall myocardial infarction on nuclear medicine     scanning per record review.  14.      Degenerative joint disease, chronic pain.   DISPOSITION:  Patient discharged to home.   MEDICATIONS INCLUDE:  1. Levaquin 500 mg daily to complete a 10-day course.  2. Dilantin 400 mg at bedtime.   PREVIOUS MEDICATIONS INCLUDE:  1. Demadex 20 daily.  2. Tarka 2/240 daily.  3. Synthroid 125 mcg daily (note increased dose).  4. Lipitor 10 mg at bedtime.  5. P.r.n. Lasix.  6. Mylanta Gas 80 mg t.i.d.  7. Robitussin DM 10 mL q.6h. p.r.n. cough.   FOLLOW UP:  1. Dr. Lodema Hong 1 week.  2. Dr. Gerilyn Pilgrim in 2 weeks.  3. Outpatient EEG.   HOSPITAL COURSE:  The patient is an 77 year old African-American female who  resides at home with the assistance of ____________ worker and family who  presented to the hospital with severe abdominal pain.  Small bowel x-ray  revealed borderline gaseous distention, several loops of small bowel in the  mid and left abdomen and gas and stool in the colon.   A CT scan was  performed as well to be sure there was no evidence of ischemic bowel given  her significant discomfort which revealed a small amount of infiltrate in  the right middle lobe, a renal cyst and lumbar spondylosis and mild  dilatation of the upper abdominal small bowel without obvious lead point for  obstruction consistent with ileus.  Incidental note was made of gas in the  endometrial cavity but no free pelvic fluid or abscess and spurring of  sacroiliac joints.  The patient continued to complain of abdominal pain.  She developed diarrhea.  Followup abdominal film revealed dilated small  bowel out of proportion to the degree of colonic distention raising the  question of a large partial small bowel.  She was seen  in consultation by  Dr. Katrinka Blazing.  However, on the following day, the x-ray revealed complete  resolution.  The patient has had mild nausea initially but was taking p.o.  and diet was advanced without difficulty and she had good bowel movements; a  C. difficile was performed which was negative.  She had some mild  hypokalemia, mild hypomagnesemia which was supplemented.  A TSH was noted to  be elevated and her Synthroid was increased to 125 mcg.  Two nights prior to  discharge the patient apparently had a bad night though the physician was  not called, essentially she described feeling as she was going to have a  seizure, she apparently was quite anxious, she settled down with some Xanax,  apparently she was taking Xanax twice daily at home and a bedtime dose as  well and we were just giving it p.r.n.  However, the family described  episodes of where the patient would feel as if she was having a seizure and  zone out for a minute or so and then come back to herself and this would  happen sometimes several times a day and certainly several times per month  and had been going on for quite some time.  The patient states she would  feel abnormal and feel it coming on and then  it would resolve.  The patient  has had no generalized seizures in quite some time.  This raised the  question of complex partial seizure disorder and a neurology consultation  was obtained.  EEG was ordered but unfortunately due to technical issues  cannot be performed until Monday.  The patient was much improved, her  Dilantin had been increased by the neurologist to 400 mg at bedtime and it  was felt that she could be discharged to home with the regimen as noted  above and followed up as an outpatient.  Initially the patient was somewhat  reluctant but felt that she could manage at home.  She was discharged to  home in stable condition.  Blood pressure at the time of discharge is  124/74, her neck is supple, she had a low-grade temperature documented to  100 though no evidence of ongoing infection, lungs are clear to auscultation  though diminished, exam was somewhat limited by her habitus, heart is  regular rate and rhythm, no murmur or gallop is noted, the abdomen is obese,  soft, slightly distended, but no rebound or guarding, extremities without  clubbing, cyanosis, or edema, she is diffusely tender in her lower  extremities but again no other findings are noted, her neurologic exam is  grossly intact, she is able to transfer from bed to chair with standby  assist and actually has returned to her baseline.     ___________________________________________                                         Hanley Hays. Josefine Class, M.D.   FED/MEDQ  D:  06/05/2003  T:  06/05/2003  Job:  [id number]   cc:   Milus Mallick. Lodema Hong, M.D.  [address]  [place], [place] [address]  Fax: [phone]   Kofi A. Gerilyn Pilgrim, M.D.  [address] [place]  [place]  [place] [address]  Fax: [phone]

## 2010-12-30 NOTE — Assessment & Plan Note (Signed)
[facility]                              CARDIOLOGY OFFICE NOTE   Sara, [name]                     MRN:          [id number]  DATE:05/02/2006                            DOB:          02-25-1919    Ms. [name] returns today after being admitted to [facility] with chest  discomfort.  She continues to have some of this with a sharp pain in her  center of her chest.   Her cardiac catheterization showed nonobstructive disease.  It had not  progressed very much since her previous study.  She ruled out for myocardial  infarction.  LV function is normal.   She is taking Vytorin 10/40 q. day and her lipids were at goal during her  hospitalization check, trandolapril 4 m a day, Norvasc 10 mg a day,  levothyroxine 175 mcg a day, generic Allegra 180 mg a day, Protonix 40 mg a  day, dilantin 100 in the morning, 300 in the evening, aspirin 81 mg a day,  Demadex 20 mg a day.   VITAL SIGNS:  Blood pressure 130/70, pulse 76 and regular, weight 233.  NECK:  Her carotids are full without bruits.  LUNGS:  Clear.  HEART:  Regular rate and rhythm.  GROIN SITE:  Stable.  LOWER EXTREMITIES:  1+ edema.  Pulses are hard to appreciate.  Her feet are  warm.   [name] is doing well.  I have made no change in her program.  We will  plan on seeing her back on a p.r.n. basis.                               Thomas C. Daleen Squibb, MD, [facility]    TCW/MedQ  DD:  05/02/2006  DT:  05/03/2006  Job #:  [id number]   cc:   Milus Mallick. Lodema Hong, M.D.

## 2010-12-30 NOTE — Consult Note (Signed)
   NAME:  [name], [name]                        ACCOUNT NO.:  [id number]   MEDICAL RECORD NO.:  [id number]                   PATIENT TYPE:  OUT   LOCATION:  GYN                                  FACILITY:  [facility]   PHYSICIAN:  Kofi A. Gerilyn Pilgrim, M.D.              DATE OF BIRTH:  [birth year]   DATE OF CONSULTATION:  DATE OF DISCHARGE:                                   CONSULTATION   ADDENDUM:  The patient's chart was fully reviewed and her Dilantin level was  7 with definitely increased dose from 300 to 400 and check level in about  four days.      ___________________________________________                                            Perlie Gold Gerilyn Pilgrim, M.D.   KAD/MEDQ  D:  06/04/2003  T:  06/04/2003  Job:  [id number]

## 2010-12-30 NOTE — Op Note (Signed)
[facility]  Patient:    [name], [name] Visit Number: [id number] MRN: [id number]          Service Type: EMS Location: ED Attending Physician:  Annamarie Dawley Dictated by:   Kennieth Rad, M.D. Proc. Date: 12/10/01 Admit Date:  12/31/2001 Discharge Date: 12/31/2001                             Operative Report  PREOPERATIVE DIAGNOSIS:  Degenerative joint disease, right knee.  POSTOPERATIVE DIAGNOSIS:  Degenerative joint disease, right knee.  PROCEDURE:  Right total knee arthroplasty.  SURGEON:  Kennieth Rad, M.D.  ANESTHESIA:  Spinal.  DESCRIPTION OF PROCEDURE:  The patient was taken to the operating room after giving adequate preop medication and given spinal anesthesia.  The right knee was prepped with DuraPrep and draped in a sterile manner.  Tourniquet and Bovie were used for hemostasis.  Anterior midline incision was made over the right knee, going through the skin and subcutaneous tissue.  Sharp and blunt dissection were made both medially and laterally and medial paramedian incision was made into the capsule from the tibial tuberosity to the quadriceps.  Patella was reflected laterally.  Knee was taken into the flexed position.  Osteophytes about the tibia, femur and patella were resected, along with resection of the soft tissue and synovium.  Tibial cutting jig was put in place; 10 mm were resected from the tibia.  Next, intramedullary rod was placed down the femoral canal.  Distal femoral cutting jig was put in place and set at 60 degrees of valgus.  Distal femoral cut was made; this was followed by femoral sizing.  The femur was sized at 70 mm.  A 70-mm right femoral jig was put in place.  Anterior and posterior cuts as well as chamfering cuts were made.  Trial component was put in place and was found to be very snug.  Next, attention was turned to the tibia.  Due to the patients valgus deformity, some soft tissue  release was further done.  Tibial cutting jig was put in place and the tibia was sized at 75 mm.  Appropriate cuts for the tibia were made.  Trial tibial component was put in place as well as 14-mm poly; this allowed full extension and full flexion, good medial and lateral stability.  Patella was gliding off laterally, subluxating.  Attention was then turned to the patella, which was sized at large.  Appropriate cutting jig was put in place and the cuts were made.  Trial component for the patella was put in place, along with the femoral and tibial components and range of motion was full extension and full flexion with lateral subluxation of the patella.  Lateral release was then done, which allowed the patella to glide and stay in place without subluxation.  Full extension and full flexion were possible.  Copious irrigation with antibiotic solution was then done, followed by mixing of the methyl methacrylate.  Patella and tibia were cemented; the femoral component was press-fitted.  Components used were the maximum primary tibial plate of 75 mm, size large patella, 70-mm right femoral component, Press-Fit, 14-mm poly, 71/75-mm wide.  With all three components in place, again range of motion was good and good tracking of the patella.  Hemostasis was obtained.  Copious irrigation was done.  Wound closure was then done with 0 Vicryl for the fascia, 2-0 for the subcutaneous and skin  staples for the skin.  Hot ice compressive dressing was applied, knee immobilizer was applied. The patient tolerated the procedure quite well and went to the recovery room in stable and satisfactory condition.Dictated by:   Kennieth Rad, M.D.  Attending Physician:  Annamarie Dawley DD:  01/01/02 TD:  01/03/02 Job: [id number]

## 2010-12-30 NOTE — Procedures (Signed)
   [name], [name]                        ACCOUNT NO.:  [id number]   MEDICAL RECORD NO.:  [id number]                   PATIENT TYPE:   LOCATION:                                       FACILITY:  [facility]   PHYSICIAN:  Kofi A. Gerilyn Pilgrim, M.D.              DATE OF BIRTH:   DATE OF PROCEDURE:  DATE OF DISCHARGE:                                EEG INTERPRETATION   HISTORY:  This is an 77 year old who has seizures.   ANALYSIS:  A 16-channel recording is conducted for approximately 20 minutes,  there is a posterior rhythm of 12 to 14 Hz.  There is fast beta activity  seen in the frontal areas.  Awake and sleep activities are seen.  Stage 2  with sleep spindles and K complexes are seen, there is also some brief delta  sleep seen.  There is no focal slowing, lateralized slowing, or epileptiform  activity seen.   IMPRESSION:  This is a normal recording of awake and sleep states.      ___________________________________________                                            Perlie Gold Gerilyn Pilgrim, M.D.   KAD/MEDQ  D:  06/22/2003  T:  06/22/2003  Job:  [id number]

## 2010-12-30 NOTE — Procedures (Signed)
NAMESHARY, LAMOS                        ACCOUNT NO.:  [id number]   MEDICAL RECORD NO.:  [id number]                   PATIENT TYPE:  INP   LOCATION:  A210                                 FACILITY:  [facility]   PHYSICIAN:  Welaka Bing, M.D.               DATE OF BIRTH:  09-Nov-1918   DATE OF PROCEDURE:  08/26/2003  DATE OF DISCHARGE:                                  ECHOCARDIOGRAM   CLINICAL DATA:  An 77 year old woman with a history of hypertension and  prior myocardial infarction who presents with chest pain.   M-MODE MEASUREMENTS:  1. Aorta is 3.0.  2. Left atrium 5.6.  3. Septum 1.7.  4. Posterior wall 1.3.  5. LV diastole 4.1.  6. LV systole 3.0.   FINDINGS:  1. Technically-adequate echocardiographic study.  2. Moderate left atrial enlargement; right atrial size at the upper limit of     normal to mildly enlarged.  3. Normal right ventricular size and function; right ventricular hypertrophy     present.  4. Mild aortic valvular sclerosis with trivial, if any stenosis by Doppler.     Calcification of the aortic annulus and proximal ascending aorta.  5. Normal pulmonic valve.  6. Normal tricuspid valve; trivial regurgitation, normal estimated RV     systolic pressure.  7. Mild focal calcification of the mitral valve with mild annular     calcification and normal function.  8. Normal internal dimension of the left ventricle; hypertrophy present with     disproportionate involvement of the septum.  Normal regional and global     LV systolic function.  9. Normal IVC.  10.      When compared to a prior study of October 14, 1999, no significant     interval change.      ___________________________________________                                            [place] Bing, M.D.   RR/MEDQ  D:  08/26/2003  T:  08/26/2003  Job:  [id number]

## 2010-12-30 NOTE — Procedures (Signed)
[name], [name]              ACCOUNT NO.:  [id number]   MEDICAL RECORD NO.:  [id number]          PATIENT TYPE:  INP   LOCATION:  A217                          FACILITY:  [facility]   PHYSICIAN:  Edward L. Juanetta Gosling, M.D.DATE OF BIRTH:  1919/06/04   DATE OF PROCEDURE:  DATE OF DISCHARGE:  04/13/2006                                EKG INTERPRETATION   April 12, 2006; time is 1830   The rhythm is sinus rhythm with a rate in the 80s.  There is right bundle-  branch block.  There are changes that could be related to injury but they  may well be related to the bundle-branch block.  This is an abnormal  electrocardiogram.      Ramon Dredge L. Juanetta Gosling, M.D.  Electronically Signed     ELH/MEDQ  D:  04/17/2006  T:  04/17/2006  Job:  [id number]

## 2010-12-30 NOTE — Op Note (Signed)
   [name], [name]                        ACCOUNT NO.:  [id number]   MEDICAL RECORD NO.:  [id number]                   PATIENT TYPE:  AMB   LOCATION:  DAY                                  FACILITY:  [facility]   PHYSICIAN:  Jerolyn Shin C. Katrinka Blazing, M.D.                DATE OF BIRTH:  [birth year]   DATE OF PROCEDURE:  DATE OF DISCHARGE:                                 OPERATIVE REPORT   PREOPERATIVE DIAGNOSIS:  Mass, left forearm, 2.5 cm.   POSTOPERATIVE DIAGNOSIS:  Mass, left forearm, 2.5 cm.   PROCEDURE:  Excision of mass, left forearm, 2.5 cm.   DESCRIPTION OF PROCEDURE:  The patient was taken to the OR suite.  The left  arm was prepped and draped in a sterile field.  Local infiltration over the  mass was carried out with 1% Xylocaine with epinephrine mixed 1:100,000.  An  incision was made over the mass and extended through the deep subcutaneous  tissue.  A 2.5-cm fibrous, somewhat multilobulated mass was noted.  It was  dissected with blunt dissection and excised.  The deep tissues were closed  with 3-0 Biosyn.  The skin was closed with 4-0 Dexon.  The patient tolerated  the procedure well.  A dressing was placed, and she was transferred to day  surgery for discharge home.                                               [name]. Katrinka Blazing, M.D.    LCS/MEDQ  D:  12/19/2002  T:  12/19/2002  Job:  [id number]

## 2010-12-30 NOTE — Consult Note (Signed)
[name], [name]                        ACCOUNT NO.:  [id number]   MEDICAL RECORD NO.:  [id number]                   PATIENT TYPE:  INP   LOCATION:  A339                                 FACILITY:  [facility]   PHYSICIAN:  Kofi A. Gerilyn Pilgrim, M.D.              DATE OF BIRTH:  March 17, 1919   DATE OF CONSULTATION:  DATE OF DISCHARGE:                                   CONSULTATION   IMPRESSION:  Spells of unresponsiveness that certainly could be  epileptic/complex partial seizures especially given the history of  convulsive seizures at baseline, certainly, they also could be anxiety-  related.  It is difficult to decipher on clinical grounds.   RECOMMENDATIONS:  1. Suggests a baseline routine EEG.  2. Also would check a Dilantin level.  3. Depending on how vigorous we want to work this up further evaluation to     be done.  4. The next step after a baseline EEG would be to do long-term inpatient EEG     video monitoring to get any electrical occurrences between these events.  5. Additional suggestion is to empirically increase the dose of her Dilantin     to see if these spells improve.  It they are truly epileptic then they     should have some response.   HISTORY:  This is an 76 year old African-American lady who apparently has  been diagnosed with epileptic seizures for about six years.  She apparently  has had convulsive seizures as far as I can tell from the history.  She has  had a few of these.  She cannot tell me how often she has convulsive  seizures, but it appears that she has what she describes as mini seizures  frequently.  They seem to occur three times per month as far as I can  ascertain from the history.  They are described essentially as feeling as if  she does not feel right.  During these brief moments she is typically  unresponsive.  The spells usually last a few seconds.  There are concerns  that these spells ,ay be anxiety-related as she often has them unde  psychosocial stressors.   The patient was admitted during this hospitalization for workup of abdominal  pain thought to be due to ileus.   PAST MEDICAL HISTORY:  1. Hyperlipidemia.  2. Hypothyroidism.  3. Anterior wall myocardial infarction.  4. History of coronary disease.  5. Status post appendectomy.  6. Status post cholecystectomy.  7. Status post recent D&C.  8. History of chronic cutaneous candidiasis versus chronic eczema; also,  9. History of seizure disorder.   MEDICATIONS ON ADMISSION:  1. Tarka  2. Demodex  3. Lasix  4. Cozaar  5. Dilantin 300 mg q.h.s.  6. Synthroid  7. Lipitor  8. Xanax.   REVIEW OF SYSTEMS:  Apparently has had nausea for two to three days, but no  emesis before being admitted.  No change in bowel movements.   The patient has a very supportive family and is fairly independent.  No  changes in mental status.  The patient's mother is at [facility]; she is 75 years old.  The patient reports that she has been worried  about not being able to attend her mother's celebration today.   PHYSICAL EXAMINATION:  VITAL SIGNS:  Temperature max of 100, blood pressure  124/69, pulse 73 and respirations 18.  NECK:  Neck is supple.  LUNGS:  The lungs are clear to auscultation bilaterally.  ABDOMEN:  The abdomen is soft, although she does have mild generalized  tenderness.  HEART:  Cardiac exam reveals normal S1 and S2.  EXTREMITIES:  Extremities show brown discoloration involving the distal  legs.  No edema.  NEUROLOGIC EXAMINATION:  The patient is alert.  She converses fluently and  coherently with generally normal language and cognition.  Speech is  coherent.  Cranial nerves II-XII are intact including the visual fields.  Motor examination reveals normal tone, bulk and strength.  Reflexes are +2.  The toes are downgoing on the right and equivocal on the left.  Trace  positive pain symmetrically and normal sensation bilaterally.   Coordination  is normal.   Thank you for this consultation.        ___________________________________________                                            Perlie Gold Gerilyn Pilgrim, M.D.   KAD/MEDQ  D:  06/04/2003  T:  06/05/2003  Job:  [id number]

## 2010-12-30 NOTE — H&P (Signed)
[name], [name]              ACCOUNT NO.:  [id number]   MEDICAL RECORD NO.:  [id number]          PATIENT TYPE:  INP   LOCATION:  A217                          FACILITY:  [facility]   PHYSICIAN:  Sara Shipper, MD     DATE OF BIRTH:  12-17-1918   DATE OF ADMISSION:  04/12/2006  DATE OF DISCHARGE:  LH                                HISTORY & PHYSICAL   PRIMARY CARE PHYSICIAN:  [name], M.D.  She has been seen by [name] in the past.   ADMITTING DIAGNOSIS:  1. Chest pain with new EKG findings, etiology unclear.  2. History of coronary artery disease in the past.  3. History of hypertension.  4. History of hypothyroidism.  5. History of seizure disorder.  6. History of anxiety disorder.   CHIEF COMPLAINT:  Sudden onset chest pain.   HISTORY OF PRESENT ILLNESS:  The patient is an 77 year old African-American  female, who has history of coronary artery disease.  According to her, she  had an MI back about ten years ago.  She also has history of hypertension,  anxiety disorder, hypothyroidism and seizure disorder.  The patient was  apparently doing well until about 2 o'clock this afternoon, when she was  sitting in her home, watching TV, when she suddenly experienced 10/10 chest  pain in the retrosternal area.  The patient is not a very good historian  when it comes to details, but I did manage to elicit that there possibly was  a very sharp pain, not radiating, but it was associated with some nausea,  with no emesis.  The patient had some shortness of breath, no diaphoresis,  no palpitations.  There were no aggravating factors.  The patient was given  an aspirin by her daughter, after which the pain started to subside.  The  pain lasted about a half hour and currently she just states that she is  feeling uncomfortable in the chest, but denies any pain.  She also gives  history of swelling in her legs for which she states she takes some kind of  fluid pill.   She gives history of stress test about two and a half years  ago, which was unremarkable.  She had what sounds like a PCI, or balloon  angioplasty, about seven to eight years ago.   MEDICATIONS AT HOME INCLUDE THE FOLLOWING:  1. Xanax 0.5 mg twice daily.  2. Norvasc 10 mg daily.  3. Phenytoin 100 mg q.a.m. and 300 mg at bedtime.  4. Levothyroxine 175 micrograms once a day.  5. Fexofenadine 180 mg once a day.  6. Tarka 4/240 once a day.  7. Vytorin 10/40 once a day.   ALLERGIES:  No known drug allergies.   PAST MEDICAL HISTORY:  History of coronary artery disease.  She said she had  her heart attack about ten years ago, when she had a syncopal episode and  apparently was rushed over to [facility].  According to notes  dictated in January of 2005 by [name], she had angiograms in  1987 and 1988, which have not  shown any obstructive lesions, as per this  note.  There is a report of an echocardiogram from January of 2005, which  showed right ventricular hypertrophy.  It showed left ventricular  hypertrophy, as well, with normal regional and global systolic function.  Stress test was done in January of 2005, which was a negative  pharmacological stress study.  Ejection fraction was estimated at 62%.   Other medical history includes hypertension, hypothyroidism, seizure  disorder, anxiety disorder.   PAST SURGICAL HISTORY:  Includes right knee replacement and cholecystectomy.   SOCIAL HISTORY:  Lives in [place], lives alone.  Her daughter lives very close-  by.  She needs either a cane, walker or a scooter to help her ambulation.  Smokes about a pack of cigarettes per day.  Has a 60-pack-year history of  smoking.  No alcohol use.  No illicit drug use.   FAMILY HISTORY:  Father had enlarged heart, hypertension.  Mother had  diabetes.   REVIEW OF SYSTEMS:  Unremarkable, except as mentioned in the HPI.   PHYSICAL EXAMINATION:  VITAL SIGNS:  Temperature is 97.5, blood  pressure on  presentation 141/78 and currently 108/77.  Heart rate in the 90s.  Respiratory rate is about 18, saturation 96% on room air.  GENERAL EXAM:  This  is an overweight, elderly female, really anxious, but  in no distress.  HEENT:  There is no pallor, no icterus.  Oral mucosa is moist.  No oral  lesions are noted.  NECK:  The neck is soft and supple.  No thyromegaly is appreciated.  LUNGS:  Clear to auscultation bilaterally with no wheezes, rales or rhonchi.  CARDIOVASCULAR:  S1, S2 is normal, regular, no murmurs appreciated, no S3,  S4 is heard, no JVD is seen.  ABDOMEN:  Soft, obese, nontender, nondistended.  Bowel sounds are present,  no mass or organomegaly appreciated.  EXTREMITIES:  Reveal no pitting edema.  Pulses are poor.  Tenderness present  over the bones, but there is no definite calf tenderness.  NEUROLOGICALLY:  The patient is alert and oriented times three, no focal  neurological deficit is appreciated.   LABORATORY DATA:  CBC is not very remarkable, really.  Her BMET also  unremarkable with normal renal function.  Two sets of cardiac markers are  negative.  Her D-dimer, however, is elevated at 2.7.   Chest x-ray shows cardiomegaly.  Lung fields appear to be clear.  That is  the official interpretation, as well.   EKG:  We have one EKG from today, which shows sinus rhythm with possible  mild left axis deviation.  Evidence for LVH is present.  There are some Q-  waves appreciated in the inferior leads; significance is questionable.  No  other concerning ST changes are found at this time.  However, the patient  does have evidence for right bundle branch block, as well as left fascicular  block, and so bifascicular block is present.  T-wave inversion is noted in  lead 3 and AVF.  There is also, interestingly, an S-wave in lead 1.   Compared to previous EKGs from 2004, 2001, as well as 2005, these changes appear to be relatively new.   IMPRESSION:  The patient  is an 77 year old African-American female with  medical problems as outlined earlier, who presented with retrosternal sudden-  onset chest pain, lasting about a half hour.  Currently, she is a little bit  uncomfortable, but denies any pain.  Differential diagnosis for presentation  include pulmonary embolism,  especially considering some of her EKG findings,  as well as positive D-dimer.  Also the sudden onset of pain.  Coronary  syndrome is also a possibility, though presentation is somewhat atypical,  but apparently this patient does have history of CAD.   PLAN:  1. Chest pain:  Considering positive D-dimer, I am going to ask for a CT      angio to rule out PE.  I will admit the patient to telemetry.  Continue      cyclic cardiac enzymes if the CT is negative.  Continue aspirin.  Check      a lipid profile tomorrow morning.  Repeat EKG.  Consider [name]      consultation, if the CT angio is negative.  2. Hypertension:  Continue all her antihypertensive agents for now.  3. Seizure disorder:  Continue Dilantin.  Check level tomorrow morning.  4. Hypothyroidism:  Continue Synthroid.  Check a TSH, free T4 level      tomorrow morning.  5. DVT/GI prophylaxis will be initiated.  6. Other studies will be considered, including echocardiogram, which may      be considered if the CT is negative.   The patient is a full code at this time.      Sara Shipper, MD  Electronically Signed     GK/MEDQ  D:  04/12/2006  T:  04/12/2006  Job:  [id number]   cc:   [name], M.D.  Fax: [phone]

## 2010-12-30 NOTE — Consult Note (Signed)
[name], [name]                        ACCOUNT NO.:  [id number]   MEDICAL RECORD NO.:  [id number]                   PATIENT TYPE:  OUT   LOCATION:  GYN                                  [facility]ACILITY:  [facility]   PHYSICIAN:  John T. Kyla Balzarine, M.D.                 DATE OF BIRTH:  1918-09-14   DATE OF CONSULTATION:  04/08/2003  DATE OF DISCHARGE:                                   CONSULTATION   REASON FOR CONSULTATION:  This 77 year old woman is seen at the request of  Dr. Patsi Sears for evaluation of a thickened endometrial stripe.   HISTORY OF PRESENT ILLNESS:  The patient has a several month history of  suprapubic discomfort. She denies crampy component. During evaluation, she  underwent a CT scan, which revealed a abnormal for age central lower  density, measuring 3 cm x 3.6 cm. Pelvic ultrasound was advised; the patient  is referred for consultation regarding management.   PAST MEDICAL HISTORY:  The patient has ischemic heart disease with 3  myocardial infarctions in the past and states she has had angioplasty with  Stents.   PAST SURGICAL HISTORY:  She is status post cholecystectomy, right knee  replacement, BTL, and appendectomy.   PAST OBSTETRIC HISTORY:  Para 10-0-0-9. She underwent menopause in the early  50's. Has never been on hormonal replacement therapy.   MEDICATIONS:  Chlorocon, Cozaar, Furosemide, Alprazolam, Phenytoin, Lipitor,  Ultracet p.r.n., Synthroid.   ALLERGIES:  No known drug allergies.   SOCIAL HISTORY:  The patient smokes 1 pack per day and denies ethanol;  widowed.   FAMILY HISTORY:  Noncontributory.   REVIEW OF SYSTEMS:  GENERAL: Denies fever, chills, malaise, fatigue.  ABDOMEN: Pain as above without alteration in bowel function. Denies nausea  or vomiting or obstructive type symptoms. Denies melena or hematochezia. GU:  As above. OB/GYN: As above. HEME/LYMPH: No pathologic lymphadenopathy or  abnormal bleeding.   PHYSICAL EXAMINATION:  VITAL  SIGNS: Blood pressure 140/76, pulse 76,  respiratory rate 20. Weight 227 pounds.  ABDOMEN: Scaphoid, benign with well healed tubal ligation incisions, without  hernia, ascites, mass or organomegaly.  BACK: No CVA tenderness.  EXTREMITIES: Have full range of motion without edema. Scar from prior knee  replacement over the right knee.  PELVIC: External genitalia and BUS are normal to inspection and palpation.  Bladder and urethra are well supported and there are no mucosal lesions.  Cervix is atrophic and flush with the vaginal fornices. I am unable to sound  the cervix. Bimanual and rectovaginal examinations reveal upper limits  uterus without adnexal pathology.   ASSESSMENT:  Questionable thickened endometrial stripe versus intramural  fibroid.   PLAN:  I attempted a P[place] biopsy but was unable to sound the endometrial  cavity. Because there is a question regarding significant endometrial  pathology, I recommended that the patient undergo vaginal probe ultrasound;  disposition will depend upon the results of  that study.                                               John T. Kyla Balzarine, M.D.    JTS/MEDQ  D:  04/08/2003  T:  04/08/2003  Job:  [id number]   cc:   Lynelle Smoke I. Patsi Sears, M.D.  [address]  [place]  [place] [address]  Fax: [phone]   Telford Nab, R.N.  [phone] [address]  [place], [place] [address]

## 2011-01-27 ENCOUNTER — Ambulatory Visit: Payer: Medicare Other | Admitting: Urology

## 2011-05-09 LAB — COMPREHENSIVE METABOLIC PANEL
ALT: 13
AST: 17
Albumin: 4.4
Alkaline Phosphatase: 135 — ABNORMAL HIGH
CO2: 29
Chloride: 103
Creatinine, Ser: 0.62
GFR calc Af Amer: >60
GFR calc non Af Amer: >60
Potassium: 3.7
Sodium: 139
Total Bilirubin: 0.4

## 2011-05-09 LAB — URINALYSIS, ROUTINE W REFLEX MICROSCOPIC
Bilirubin Urine: NEGATIVE
Glucose, UA: NEGATIVE
Hgb urine dipstick: NEGATIVE
Ketones, ur: NEGATIVE
Nitrite: NEGATIVE
Specific Gravity, Urine: 1.008
pH: 7

## 2011-05-09 LAB — CBC
MCV: 91.6
Platelets: 272
RBC: 4.35
WBC: 6

## 2011-05-09 LAB — APTT: aPTT: 28

## 2011-05-10 LAB — CBC
HCT: 26 — ABNORMAL LOW
HCT: 30.5 — ABNORMAL LOW
Hemoglobin: 10.4 — ABNORMAL LOW
Hemoglobin: 8.9 — ABNORMAL LOW
MCHC: 34.1
MCHC: 34.2
MCHC: 35
MCV: 91
Platelets: 177
Platelets: 183
RBC: 3.31 — ABNORMAL LOW
RDW: 13.5
RDW: 13.5

## 2011-05-10 LAB — HEMOGLOBIN AND HEMATOCRIT, BLOOD: HCT: 27.7 — ABNORMAL LOW

## 2011-05-10 LAB — BASIC METABOLIC PANEL
BUN: 8
BUN: 8
CO2: 28
CO2: 29
CO2: 30
Calcium: 8.7
Calcium: 8.9
Chloride: 102
Chloride: 98
Creatinine, Ser: 0.68
GFR calc Af Amer: >60
Glucose, Bld: 120 — ABNORMAL HIGH
Potassium: 3.7
Sodium: 134 — ABNORMAL LOW
Sodium: 141

## 2011-05-25 LAB — BILIRUBIN, FRACTIONATED(TOT/DIR/INDIR)
Bilirubin, Direct: 0.1
Indirect Bilirubin: 0.3

## 2011-05-25 LAB — CBC
MCHC: 33.7
RBC: 3.9
WBC: 11.3 — ABNORMAL HIGH

## 2011-05-25 LAB — DIFFERENTIAL
Basophils Relative: 4 — ABNORMAL HIGH
Lymphs Abs: 1.8
Monocytes Relative: 14 — ABNORMAL HIGH
Neutro Abs: 7.5
Neutrophils Relative %: 66

## 2011-05-25 LAB — BASIC METABOLIC PANEL
Calcium: 9.4
Creatinine, Ser: 0.69
GFR calc Af Amer: >60
GFR calc non Af Amer: >60

## 2011-05-25 LAB — POCT CARDIAC MARKERS
Myoglobin, poc: 205
Myoglobin, poc: 270

## 2011-05-29 LAB — BASIC METABOLIC PANEL
CO2: 26
Calcium: 9.4
GFR calc Af Amer: >60
GFR calc non Af Amer: >60
Sodium: 139

## 2011-05-29 LAB — HEMOGLOBIN AND HEMATOCRIT, BLOOD
HCT: 37.5
Hemoglobin: 12.5

## 2011-05-31 LAB — BASIC METABOLIC PANEL
Calcium: 9.8
Chloride: 107
Creatinine, Ser: 0.57
GFR calc Af Amer: >60
GFR calc non Af Amer: >60

## 2011-05-31 LAB — HEMOGLOBIN AND HEMATOCRIT, BLOOD
HCT: 35.4 — ABNORMAL LOW
Hemoglobin: 12

## 2011-07-24 ENCOUNTER — Other Ambulatory Visit: Payer: Self-pay | Admitting: Neurology

## 2011-07-24 DIAGNOSIS — I639 Cerebral infarction, unspecified: Secondary | ICD-10-CM

## 2011-07-26 ENCOUNTER — Ambulatory Visit (HOSPITAL_COMMUNITY): Payer: Medicare Other

## 2011-07-31 ENCOUNTER — Ambulatory Visit (HOSPITAL_COMMUNITY)
Admission: RE | Admit: 2011-07-31 | Discharge: 2011-07-31 | Disposition: A | Discharge status: Home / Self Care | Payer: Medicare Other | Source: Ambulatory Visit | Attending: Neurology | Admitting: Neurology

## 2011-07-31 DIAGNOSIS — I6350 Cerebral infarction due to unspecified occlusion or stenosis of unspecified cerebral artery: Secondary | ICD-10-CM | POA: Insufficient documentation

## 2011-07-31 DIAGNOSIS — I639 Cerebral infarction, unspecified: Secondary | ICD-10-CM

## 2011-09-21 ENCOUNTER — Ambulatory Visit (HOSPITAL_COMMUNITY)
Admission: RE | Admit: 2011-09-21 | Discharge: 2011-09-21 | Disposition: A | Discharge status: Home / Self Care | Payer: Medicare Other | Source: Ambulatory Visit | Attending: Neurology | Admitting: Neurology

## 2011-09-21 ENCOUNTER — Other Ambulatory Visit: Payer: Self-pay | Admitting: Neurology

## 2011-09-21 DIAGNOSIS — R05 Cough: Secondary | ICD-10-CM

## 2011-09-21 DIAGNOSIS — R0600 Dyspnea, unspecified: Secondary | ICD-10-CM

## 2011-09-21 DIAGNOSIS — R059 Cough, unspecified: Secondary | ICD-10-CM | POA: Insufficient documentation

## 2011-09-21 DIAGNOSIS — R0602 Shortness of breath: Secondary | ICD-10-CM | POA: Insufficient documentation

## 2011-12-25 ENCOUNTER — Other Ambulatory Visit (HOSPITAL_COMMUNITY): Payer: Self-pay | Admitting: Internal Medicine

## 2011-12-25 ENCOUNTER — Ambulatory Visit (HOSPITAL_COMMUNITY)
Admission: RE | Admit: 2011-12-25 | Discharge: 2011-12-25 | Disposition: A | Discharge status: Home / Self Care | Payer: Medicare Other | Source: Ambulatory Visit | Attending: Internal Medicine | Admitting: Internal Medicine

## 2011-12-25 DIAGNOSIS — M25552 Pain in left hip: Secondary | ICD-10-CM

## 2011-12-25 DIAGNOSIS — M1610 Unilateral primary osteoarthritis, unspecified hip: Secondary | ICD-10-CM | POA: Insufficient documentation

## 2011-12-25 DIAGNOSIS — M169 Osteoarthritis of hip, unspecified: Secondary | ICD-10-CM | POA: Insufficient documentation

## 2012-01-18 IMAGING — CT CT ANGIO CHEST
2 of 6 series · 6 of 36 positions shown · IV contrast (Omnipaque 300)
Comparison: Plain film of the chest earlier this same day and CT
chest 04/12/2006.

CLINICAL DATA: Chest pain and weakness.

CT ANGIOGRAPHY CHEST WITH CONTRAST
TECHNIQUE: Multidetector CT imaging of the chest was performed
using the standard protocol during bolus administration of
intravenous contrast.  Multiplanar CT image reconstructions
including MIPs were obtained to evaluate the vascular anatomy.
Contrast:  100 ml Ymnipaque-VEE.

[Series 4: pe 3.0 b40f · axial · 0.73mm/px · z∈[-215,-59]mm · 5 of 78 slices shown]
[im 13/78  lung]
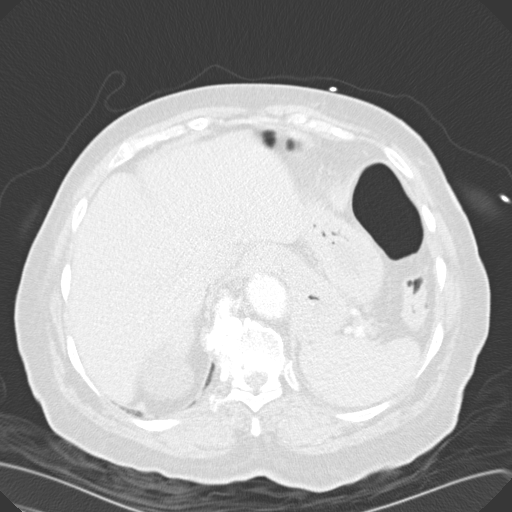
[im 26/78  mediastinal]
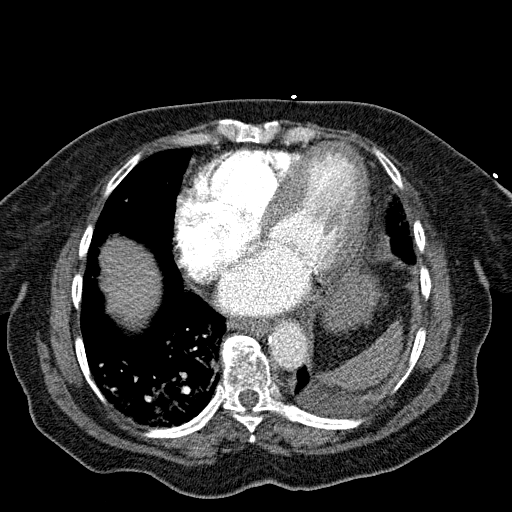
[im 39/78  lung]
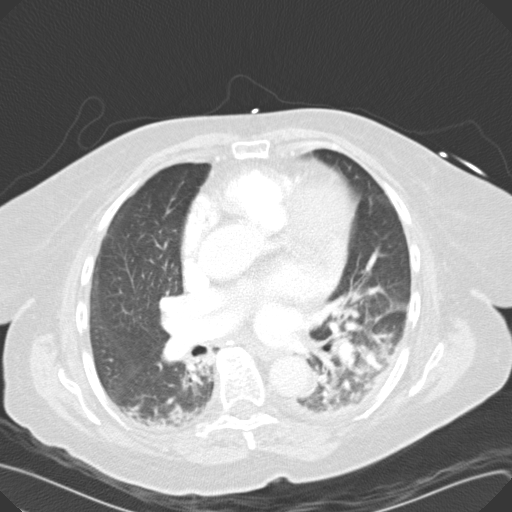
[im 52/78  mediastinal]
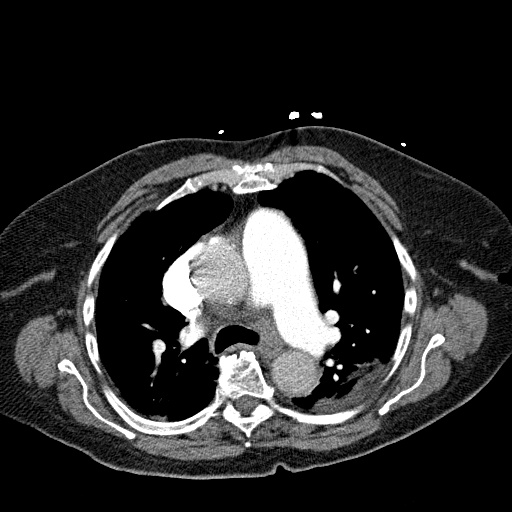
[im 65/78  lung]
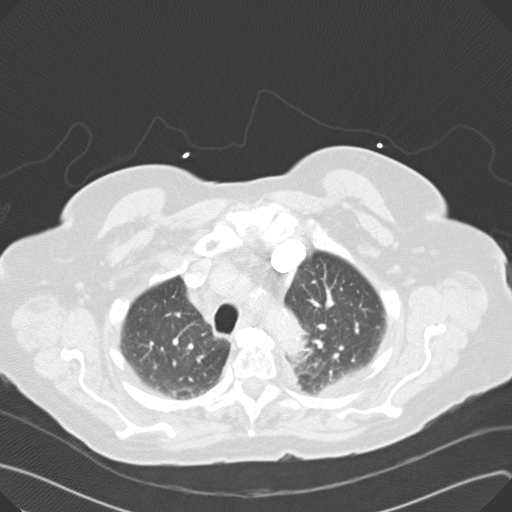

[Series 6: mpr coronal pe 3mm · coronal · 0.45mm/px · 1 of 79 slices shown]
[im 40/79  mediastinal]
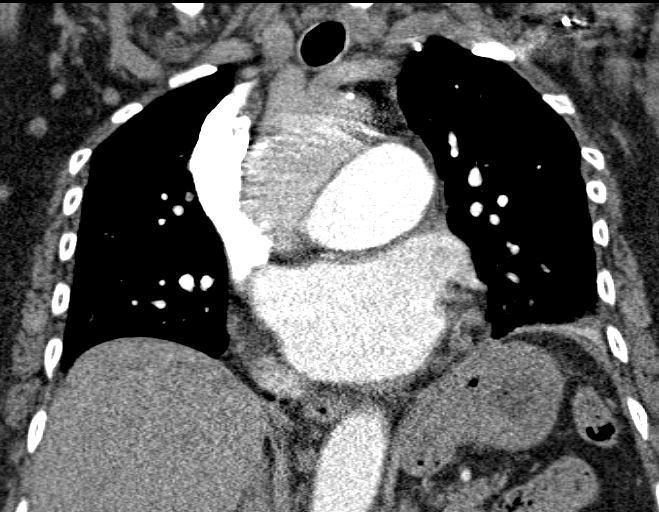

[6 of 36 positions shown; findings below may reference images not displayed]

FINDINGS: The study is positive for pulmonary embolus with clot
seen at the bifurcation of the right main pulmonary artery.  There
is also clot in branches of the descending left interlobar
pulmonary artery.  The patient has cardiomegaly.  There is some
straightening of the interventricular septum suggesting right heart
strain.  Small left pleural effusion is identified.  No right
pleural effusion or pericardial effusion.  The thyroid gland is
diffusely enlarged without focal lesion.

Lungs demonstrate some basilar atelectasis.  The airway is
unremarkable.

Incidentally imaged upper abdomen shows partial visualization of
low attenuation lesions in both kidneys likely representing cysts.
Visualized intra-abdominal contents are otherwise unremarkable.
There is no focal bony abnormality with convex right thoracic
scoliosis noted.  Changes of DISH of the thoracic spine are also
seen, unchanged.

Review of the MIP images confirms the above findings.
IMPRESSION: 1.  Study is positive for pulmonary embolus. Critical test results
telephoned to Dr. Kumaravelu at the time of interpretation on 06/17/2010
at [DATE] p.m.
2.  Cardiomegaly.
3.  Small left pleural effusion.

## 2012-02-08 ENCOUNTER — Encounter: Payer: Self-pay | Admitting: Family Medicine

## 2012-02-08 ENCOUNTER — Ambulatory Visit (INDEPENDENT_AMBULATORY_CARE_PROVIDER_SITE_OTHER): Payer: Medicare Other | Admitting: Family Medicine

## 2012-02-08 VITALS — BP 140/78 | HR 69 | Resp 16 | Ht 68.0 in | Wt 202.8 lb

## 2012-02-08 DIAGNOSIS — J449 Chronic obstructive pulmonary disease, unspecified: Secondary | ICD-10-CM

## 2012-02-08 DIAGNOSIS — R32 Unspecified urinary incontinence: Secondary | ICD-10-CM

## 2012-02-08 DIAGNOSIS — E785 Hyperlipidemia, unspecified: Secondary | ICD-10-CM

## 2012-02-08 DIAGNOSIS — G8929 Other chronic pain: Secondary | ICD-10-CM

## 2012-02-08 DIAGNOSIS — I10 Essential (primary) hypertension: Secondary | ICD-10-CM

## 2012-02-08 DIAGNOSIS — F411 Generalized anxiety disorder: Secondary | ICD-10-CM

## 2012-02-08 DIAGNOSIS — R109 Unspecified abdominal pain: Secondary | ICD-10-CM

## 2012-02-08 DIAGNOSIS — I2510 Atherosclerotic heart disease of native coronary artery without angina pectoris: Secondary | ICD-10-CM

## 2012-02-08 DIAGNOSIS — N390 Urinary tract infection, site not specified: Secondary | ICD-10-CM

## 2012-02-08 DIAGNOSIS — R569 Unspecified convulsions: Secondary | ICD-10-CM

## 2012-02-08 DIAGNOSIS — E669 Obesity, unspecified: Secondary | ICD-10-CM

## 2012-02-08 DIAGNOSIS — E039 Hypothyroidism, unspecified: Secondary | ICD-10-CM

## 2012-02-08 LAB — COMPREHENSIVE METABOLIC PANEL
ALT: <8 U/L (ref 0–35)
AST: 15 U/L (ref 0–37)
CO2: 26 mEq/L (ref 19–32)
Calcium: 9.6 mg/dL (ref 8.4–10.5)
Chloride: 106 mEq/L (ref 96–112)
Potassium: 4.3 mEq/L (ref 3.5–5.3)
Sodium: 140 mEq/L (ref 135–145)
Total Protein: 6.6 g/dL (ref 6.0–8.3)

## 2012-02-08 LAB — TSH: TSH: 9.673 u[IU]/mL — ABNORMAL HIGH (ref 0.350–4.500)

## 2012-02-08 LAB — CBC WITH DIFFERENTIAL/PLATELET
Basophils Absolute: 0 10*3/uL (ref 0.0–0.1)
Basophils Relative: 1 % (ref 0–1)
Eosinophils Absolute: 0.1 10*3/uL (ref 0.0–0.7)
Hemoglobin: 12.5 g/dL (ref 12.0–15.0)
MCH: 29.2 pg (ref 26.0–34.0)
MCHC: 33.5 g/dL (ref 30.0–36.0)
Monocytes Absolute: 0.8 10*3/uL (ref 0.1–1.0)
Monocytes Relative: 13 % — ABNORMAL HIGH (ref 3–12)
Neutro Abs: 2.8 10*3/uL (ref 1.7–7.7)
Neutrophils Relative %: 42 % — ABNORMAL LOW (ref 43–77)
RDW: 13.9 % (ref 11.5–15.5)

## 2012-02-08 LAB — POCT URINALYSIS DIPSTICK
Bilirubin, UA: NEGATIVE
Glucose, UA: NEGATIVE
Ketones, UA: NEGATIVE
Spec Grav, UA: >=1.03
pH, UA: 5.5

## 2012-02-08 LAB — LDL CHOLESTEROL, DIRECT: Direct LDL: 68 mg/dL

## 2012-02-08 LAB — T4: T4, Total: 9.2 ug/dL (ref 5.0–12.5)

## 2012-02-08 MED ORDER — ALPRAZOLAM 0.25 MG PO TABS: 0.2500 mg | ORAL_TABLET | Freq: Every evening | ORAL | Status: DC | PRN

## 2012-02-08 NOTE — Patient Instructions (Addendum)
I will call you about the urine  Continue current meds Nerve pill once a day as needed Take the lasix before 6pm  Get the labs done I will call with results to you  F/U 3 Months

## 2012-02-11 DIAGNOSIS — G8929 Other chronic pain: Secondary | ICD-10-CM | POA: Insufficient documentation

## 2012-02-11 DIAGNOSIS — R109 Unspecified abdominal pain: Secondary | ICD-10-CM | POA: Insufficient documentation

## 2012-02-11 LAB — URINE CULTURE: Colony Count: 50000

## 2012-02-11 NOTE — Assessment & Plan Note (Signed)
On vytorin, no ASA

## 2012-02-11 NOTE — Assessment & Plan Note (Signed)
noted 

## 2012-02-11 NOTE — Progress Notes (Signed)
  Subjective:    Patient ID: [name], female    DOB: 07-Dec-1918, 78 y.o.   MRN: [id number]  HPI Pt presents to establish care, previous PCP Dr. Catalina Pizza Medications and History reviewed Daughter Kym Groom [phone] She lives alone, family has tried for many years to have her move in, they have hired nursing and friends to be with her she has declined all of it, she refuses to move and she does not want hired help. She has concern of chronic pelvic pain, she has had for many years, her daughter states and it is not new, +urgency and pressure, she has urinary frequency at night, but some nights takes lasix- this is all chronic per daughter, she feels like something is falling from her stomach when she walks.  Review of Systems  GEN- + fatigue, fever, weight loss,weakness, recent illness HEENT- denies eye drainage, change in vision, nasal discharge, CVS- denies chest pain, palpitations RESP- denies SOB, cough, wheeze ABD- denies N/V, change in stools, abd pain GU- denies dysuria, hematuria, dribbling, +incontinence MSK- + joint pain, muscle aches, injury Neuro- denies headache, dizziness, syncope, seizure activity       Objective:   Physical Exam GEN- NAD, alert and oriented x3, elderly female, walks with assistance HEENT- PERRL, EOMI, non injected sclera, pink conjunctiva, MMM, oropharynx clear Neck- Supple,  CVS- RRR, no murmur ABD-NABS,soft,NT,ND RESP-CTAB EXT- + edema Pulses- Radial, DP- 2+ Psych-normal affect and mood, fairly good memory       Assessment & Plan:

## 2012-02-11 NOTE — Assessment & Plan Note (Signed)
Goal < 140/90 or lowest without being symptomatic

## 2012-02-11 NOTE — Assessment & Plan Note (Signed)
Check thyroid studies, continue synthroid,she does not take her medications on regular basis per family

## 2012-02-11 NOTE — Assessment & Plan Note (Signed)
She likley has some bladder prolapse as well, with her urgency, culture to be done before treatment

## 2012-02-11 NOTE — Assessment & Plan Note (Signed)
No inhalers currently, breathing is okay

## 2012-02-11 NOTE — Assessment & Plan Note (Signed)
Uses benzo rarely, she is alone therefore I would like to limit this for fear of increased fall risk

## 2012-02-11 NOTE — Assessment & Plan Note (Signed)
No seizure in > 1 year on keppra

## 2012-02-12 ENCOUNTER — Encounter: Payer: Self-pay | Admitting: Family Medicine

## 2012-02-12 MED ORDER — CEPHALEXIN 500 MG PO CAPS: 500.0000 mg | ORAL_CAPSULE | Freq: Two times a day (BID) | ORAL | Status: AC

## 2012-02-12 NOTE — Addendum Note (Signed)
Addended by: Milinda Antis F on: 02/12/2012 12:00 PM   Modules accepted: Orders

## 2012-03-07 ENCOUNTER — Encounter: Payer: Self-pay | Admitting: Family Medicine

## 2012-03-07 ENCOUNTER — Ambulatory Visit (INDEPENDENT_AMBULATORY_CARE_PROVIDER_SITE_OTHER): Payer: Medicare Other | Admitting: Family Medicine

## 2012-03-07 VITALS — BP 120/62 | HR 62 | Resp 16 | Ht 68.0 in | Wt 204.4 lb

## 2012-03-07 DIAGNOSIS — R531 Weakness: Secondary | ICD-10-CM

## 2012-03-07 DIAGNOSIS — R269 Unspecified abnormalities of gait and mobility: Secondary | ICD-10-CM

## 2012-03-07 DIAGNOSIS — E039 Hypothyroidism, unspecified: Secondary | ICD-10-CM

## 2012-03-07 DIAGNOSIS — I10 Essential (primary) hypertension: Secondary | ICD-10-CM

## 2012-03-07 DIAGNOSIS — R609 Edema, unspecified: Secondary | ICD-10-CM

## 2012-03-07 DIAGNOSIS — R600 Localized edema: Secondary | ICD-10-CM | POA: Insufficient documentation

## 2012-03-07 DIAGNOSIS — R5381 Other malaise: Secondary | ICD-10-CM

## 2012-03-07 MED ORDER — POTASSIUM CHLORIDE ER 10 MEQ PO TBCR: 10.0000 meq | EXTENDED_RELEASE_TABLET | Freq: Every day | ORAL | Status: DC

## 2012-03-07 MED ORDER — ALPRAZOLAM 0.25 MG PO TABS: 0.2500 mg | ORAL_TABLET | Freq: Every evening | ORAL | Status: DC | PRN

## 2012-03-07 NOTE — Assessment & Plan Note (Signed)
BP looks good today, no change to meds 

## 2012-03-07 NOTE — Patient Instructions (Signed)
Take 1/2 tablet of the water pill once a day, take the potassium with it I will make the referral to get someone at home Try Aspercreme for your joints  Keep previous f/u appointment

## 2012-03-07 NOTE — Assessment & Plan Note (Signed)
Walks with walker but rarely, she uses motorized scooter

## 2012-03-07 NOTE — Assessment & Plan Note (Signed)
I think she would benefit from help though she has declined help in the past that her family has set up and paid for out of pocket. She lives along and has a higher fall risk with her deconditioning. She does not appear ill today, labs reviewed. She could use help with preparing meals, bathing and outings.

## 2012-03-07 NOTE — Assessment & Plan Note (Signed)
Pt to try to take 1/2 tablet of lasix with potassium daily, She tends to take her medications when she wants to. This will help some of her leg pains because she tends to collect a good amount of fluid

## 2012-03-07 NOTE — Progress Notes (Signed)
  Subjective:    Patient ID: [name], female    DOB: 1919/03/30, 78 y.o.   MRN: [id number]  HPI  Pt here secondary to weakness and need to get [facility] services started. She states she weaks and needs help at home, she understands her age and her not walking very much makes her weaker. She denies abd pain, CP, SOB, Dizziness. She completed antibiotics for her urine infection. She gets leg cramps and hand cramps at times. Her legs hurt worse when she has swelling  But she only takes 1/2 tablet at random times and has not been taking her potassium with this.  She does tell me today she is taking her synthroid  [facility] and they told her she needed a statement to get services.  No falls recently, no seizure activity   Review of Systems   GEN- + fatigue, fever, weight loss,weakness, recent illness HEENT- denies eye drainage, change in vision, nasal discharge, CVS- denies chest pain, palpitations RESP- denies SOB, cough, wheeze ABD- denies N/V, change in stools, abd pain GU- denies dysuria, hematuria, dribbling, +incontinence MSK- + joint pain, muscle aches, injury Neuro- denies headache, dizziness, syncope, seizure activity       Objective:   Physical Exam  GEN- NAD, alert and oriented x3, elderly female, not ill appearing, no change in weight HEENT- PERRL, EOMI, non injected sclera, pink conjunctiva, MMM, oropharynx clear Neck- Supple,  CVS- RRR, no murmur RESP-CTAB ABD-NABS,soft,NT,ND  EXT- 1+ edema, more pedal   Pulses- Radial, DP- 2+ Psych-normal affect and mood, fairly good memory Gait- walks slowly and gingerly with walker  Neuro- CNII-XII in tact grossly, no new focal deficits       Assessment & Plan:

## 2012-03-07 NOTE — Assessment & Plan Note (Signed)
Reiterated importance of taking synthroid daily as prescribed, this may also be a part of her fatigue

## 2012-04-23 ENCOUNTER — Encounter (HOSPITAL_COMMUNITY): Payer: Self-pay | Admitting: Emergency Medicine

## 2012-04-23 ENCOUNTER — Emergency Department (HOSPITAL_COMMUNITY): Payer: Medicare PPO

## 2012-04-23 ENCOUNTER — Inpatient Hospital Stay (HOSPITAL_COMMUNITY)
Admission: EM | Admit: 2012-04-23 | Discharge: 2012-04-29 | DRG: 563 | Disposition: A | Discharge status: Skilled Nursing Facility | Payer: Medicare PPO | Attending: Internal Medicine | Admitting: Internal Medicine

## 2012-04-23 DIAGNOSIS — IMO0002 Reserved for concepts with insufficient information to code with codable children: Secondary | ICD-10-CM

## 2012-04-23 DIAGNOSIS — R569 Unspecified convulsions: Secondary | ICD-10-CM

## 2012-04-23 DIAGNOSIS — M5137 Other intervertebral disc degeneration, lumbosacral region: Secondary | ICD-10-CM | POA: Diagnosis present

## 2012-04-23 DIAGNOSIS — R600 Localized edema: Secondary | ICD-10-CM

## 2012-04-23 DIAGNOSIS — S42301A Unspecified fracture of shaft of humerus, right arm, initial encounter for closed fracture: Secondary | ICD-10-CM | POA: Diagnosis present

## 2012-04-23 DIAGNOSIS — F329 Major depressive disorder, single episode, unspecified: Secondary | ICD-10-CM | POA: Diagnosis present

## 2012-04-23 DIAGNOSIS — Z96659 Presence of unspecified artificial knee joint: Secondary | ICD-10-CM

## 2012-04-23 DIAGNOSIS — Y92009 Unspecified place in unspecified non-institutional (private) residence as the place of occurrence of the external cause: Secondary | ICD-10-CM

## 2012-04-23 DIAGNOSIS — F411 Generalized anxiety disorder: Secondary | ICD-10-CM

## 2012-04-23 DIAGNOSIS — E669 Obesity, unspecified: Secondary | ICD-10-CM

## 2012-04-23 DIAGNOSIS — S42293A Other displaced fracture of upper end of unspecified humerus, initial encounter for closed fracture: Principal | ICD-10-CM | POA: Diagnosis present

## 2012-04-23 DIAGNOSIS — I2510 Atherosclerotic heart disease of native coronary artery without angina pectoris: Secondary | ICD-10-CM

## 2012-04-23 DIAGNOSIS — E039 Hypothyroidism, unspecified: Secondary | ICD-10-CM

## 2012-04-23 DIAGNOSIS — M1990 Unspecified osteoarthritis, unspecified site: Secondary | ICD-10-CM | POA: Diagnosis present

## 2012-04-23 DIAGNOSIS — S80219A Abrasion, unspecified knee, initial encounter: Secondary | ICD-10-CM

## 2012-04-23 DIAGNOSIS — S42202A Unspecified fracture of upper end of left humerus, initial encounter for closed fracture: Secondary | ICD-10-CM

## 2012-04-23 DIAGNOSIS — L8992 Pressure ulcer of unspecified site, stage 2: Secondary | ICD-10-CM | POA: Diagnosis present

## 2012-04-23 DIAGNOSIS — W06XXXA Fall from bed, initial encounter: Secondary | ICD-10-CM | POA: Diagnosis present

## 2012-04-23 DIAGNOSIS — Z9861 Coronary angioplasty status: Secondary | ICD-10-CM

## 2012-04-23 DIAGNOSIS — M51379 Other intervertebral disc degeneration, lumbosacral region without mention of lumbar back pain or lower extremity pain: Secondary | ICD-10-CM | POA: Diagnosis present

## 2012-04-23 DIAGNOSIS — L89109 Pressure ulcer of unspecified part of back, unspecified stage: Secondary | ICD-10-CM | POA: Diagnosis present

## 2012-04-23 DIAGNOSIS — S42309A Unspecified fracture of shaft of humerus, unspecified arm, initial encounter for closed fracture: Secondary | ICD-10-CM

## 2012-04-23 DIAGNOSIS — E871 Hypo-osmolality and hyponatremia: Secondary | ICD-10-CM | POA: Diagnosis present

## 2012-04-23 DIAGNOSIS — Z79899 Other long term (current) drug therapy: Secondary | ICD-10-CM

## 2012-04-23 DIAGNOSIS — D649 Anemia, unspecified: Secondary | ICD-10-CM

## 2012-04-23 DIAGNOSIS — J449 Chronic obstructive pulmonary disease, unspecified: Secondary | ICD-10-CM | POA: Diagnosis present

## 2012-04-23 DIAGNOSIS — K5900 Constipation, unspecified: Secondary | ICD-10-CM | POA: Diagnosis not present

## 2012-04-23 DIAGNOSIS — G5630 Lesion of radial nerve, unspecified upper limb: Secondary | ICD-10-CM

## 2012-04-23 DIAGNOSIS — S42302A Unspecified fracture of shaft of humerus, left arm, initial encounter for closed fracture: Secondary | ICD-10-CM

## 2012-04-23 DIAGNOSIS — N390 Urinary tract infection, site not specified: Secondary | ICD-10-CM

## 2012-04-23 DIAGNOSIS — F17200 Nicotine dependence, unspecified, uncomplicated: Secondary | ICD-10-CM

## 2012-04-23 DIAGNOSIS — Z6831 Body mass index (BMI) 31.0-31.9, adult: Secondary | ICD-10-CM

## 2012-04-23 DIAGNOSIS — Z7982 Long term (current) use of aspirin: Secondary | ICD-10-CM

## 2012-04-23 DIAGNOSIS — W19XXXA Unspecified fall, initial encounter: Secondary | ICD-10-CM

## 2012-04-23 DIAGNOSIS — L89152 Pressure ulcer of sacral region, stage 2: Secondary | ICD-10-CM | POA: Diagnosis present

## 2012-04-23 DIAGNOSIS — M1710 Unilateral primary osteoarthritis, unspecified knee: Secondary | ICD-10-CM

## 2012-04-23 DIAGNOSIS — I10 Essential (primary) hypertension: Secondary | ICD-10-CM

## 2012-04-23 DIAGNOSIS — G8929 Other chronic pain: Secondary | ICD-10-CM

## 2012-04-23 DIAGNOSIS — J4489 Other specified chronic obstructive pulmonary disease: Secondary | ICD-10-CM

## 2012-04-23 DIAGNOSIS — F3289 Other specified depressive episodes: Secondary | ICD-10-CM | POA: Diagnosis present

## 2012-04-23 DIAGNOSIS — R5381 Other malaise: Secondary | ICD-10-CM

## 2012-04-23 DIAGNOSIS — R531 Weakness: Secondary | ICD-10-CM

## 2012-04-23 DIAGNOSIS — R32 Unspecified urinary incontinence: Secondary | ICD-10-CM

## 2012-04-23 DIAGNOSIS — I252 Old myocardial infarction: Secondary | ICD-10-CM

## 2012-04-23 DIAGNOSIS — S42201A Unspecified fracture of upper end of right humerus, initial encounter for closed fracture: Secondary | ICD-10-CM

## 2012-04-23 DIAGNOSIS — R269 Unspecified abnormalities of gait and mobility: Secondary | ICD-10-CM | POA: Diagnosis present

## 2012-04-23 DIAGNOSIS — J984 Other disorders of lung: Secondary | ICD-10-CM

## 2012-04-23 DIAGNOSIS — Z23 Encounter for immunization: Secondary | ICD-10-CM

## 2012-04-23 DIAGNOSIS — R609 Edema, unspecified: Secondary | ICD-10-CM

## 2012-04-23 DIAGNOSIS — F410 Panic disorder [episodic paroxysmal anxiety] without agoraphobia: Secondary | ICD-10-CM

## 2012-04-23 DIAGNOSIS — G40909 Epilepsy, unspecified, not intractable, without status epilepticus: Secondary | ICD-10-CM | POA: Diagnosis present

## 2012-04-23 LAB — BASIC METABOLIC PANEL
BUN: 17 mg/dL (ref 6–23)
CO2: 27 mEq/L (ref 19–32)
Chloride: 103 mEq/L (ref 96–112)
GFR calc non Af Amer: 63 mL/min — ABNORMAL LOW (ref >90)
Glucose, Bld: 126 mg/dL — ABNORMAL HIGH (ref 70–99)
Potassium: 3.4 mEq/L — ABNORMAL LOW (ref 3.5–5.1)
Sodium: 142 mEq/L (ref 135–145)

## 2012-04-23 LAB — CBC WITH DIFFERENTIAL/PLATELET
Eosinophils Absolute: 0 10*3/uL (ref 0.0–0.7)
Hemoglobin: 13.9 g/dL (ref 12.0–15.0)
Lymphocytes Relative: 10 % — ABNORMAL LOW (ref 12–46)
Lymphs Abs: 1.2 10*3/uL (ref 0.7–4.0)
Monocytes Relative: 13 % — ABNORMAL HIGH (ref 3–12)
Neutro Abs: 9.2 10*3/uL — ABNORMAL HIGH (ref 1.7–7.7)
Neutrophils Relative %: 77 % (ref 43–77)
Platelets: 273 10*3/uL (ref 150–400)
RBC: 4.74 MIL/uL (ref 3.87–5.11)
WBC: 11.9 10*3/uL — ABNORMAL HIGH (ref 4.0–10.5)

## 2012-04-23 LAB — URINE MICROSCOPIC-ADD ON

## 2012-04-23 LAB — URINALYSIS, ROUTINE W REFLEX MICROSCOPIC
Bilirubin Urine: NEGATIVE
Ketones, ur: NEGATIVE mg/dL
Specific Gravity, Urine: 1.03 (ref 1.005–1.030)
pH: 5.5 (ref 5.0–8.0)

## 2012-04-23 MED ORDER — SODIUM CHLORIDE 0.9 % IV SOLN
Freq: Once | INTRAVENOUS | Status: AC
  Administered 2012-04-23: 07:00:00 via INTRAVENOUS

## 2012-04-23 MED ORDER — PANTOPRAZOLE SODIUM 40 MG PO TBEC
40.0000 mg | DELAYED_RELEASE_TABLET | Freq: Every day | ORAL | Status: DC
  Administered 2012-04-23 – 2012-04-29 (×7): 40 mg via ORAL
  Filled 2012-04-23 (×7): qty 1

## 2012-04-23 MED ORDER — SODIUM CHLORIDE 0.9 % IV BOLUS (SEPSIS)
500.0000 mL | Freq: Once | INTRAVENOUS | Status: AC
  Administered 2012-04-23: 500 mL via INTRAVENOUS

## 2012-04-23 MED ORDER — IBUPROFEN 800 MG PO TABS
400.0000 mg | ORAL_TABLET | Freq: Four times a day (QID) | ORAL | Status: DC | PRN
  Administered 2012-04-23 – 2012-04-24 (×3): 400 mg via ORAL
  Filled 2012-04-23: qty 2
  Filled 2012-04-23 (×3): qty 1

## 2012-04-23 MED ORDER — MORPHINE SULFATE 2 MG/ML IJ SOLN
2.0000 mg | Freq: Once | INTRAMUSCULAR | Status: AC
  Administered 2012-04-23: 2 mg via INTRAVENOUS

## 2012-04-23 MED ORDER — ACETAMINOPHEN 650 MG RE SUPP: 650.0000 mg | Freq: Four times a day (QID) | RECTAL | Status: DC | PRN

## 2012-04-23 MED ORDER — VITAMIN D (ERGOCALCIFEROL) 1.25 MG (50000 UNIT) PO CAPS
50000.0000 [IU] | ORAL_CAPSULE | ORAL | Status: DC
  Administered 2012-04-23: 50000 [IU] via ORAL
  Filled 2012-04-23 (×2): qty 1

## 2012-04-23 MED ORDER — EZETIMIBE-SIMVASTATIN 10-80 MG PO TABS: 1.0000 | ORAL_TABLET | Freq: Every day | ORAL | Status: DC

## 2012-04-23 MED ORDER — ACETAMINOPHEN 325 MG PO TABS
650.0000 mg | ORAL_TABLET | ORAL | Status: DC | PRN
  Administered 2012-04-23 – 2012-04-24 (×4): 650 mg via ORAL
  Filled 2012-04-23 (×5): qty 2

## 2012-04-23 MED ORDER — FOLIC ACID 1 MG PO TABS
1.0000 mg | ORAL_TABLET | Freq: Every day | ORAL | Status: DC
  Administered 2012-04-23 – 2012-04-29 (×7): 1 mg via ORAL
  Filled 2012-04-23 (×7): qty 1

## 2012-04-23 MED ORDER — ALPRAZOLAM 0.25 MG PO TABS
0.2500 mg | ORAL_TABLET | Freq: Every evening | ORAL | Status: DC | PRN
  Administered 2012-04-23 – 2012-04-28 (×5): 0.25 mg via ORAL
  Filled 2012-04-23 (×5): qty 1

## 2012-04-23 MED ORDER — LEVETIRACETAM 500 MG PO TABS
250.0000 mg | ORAL_TABLET | Freq: Two times a day (BID) | ORAL | Status: DC
  Administered 2012-04-23 – 2012-04-29 (×13): 250 mg via ORAL
  Filled 2012-04-23 (×4): qty 1
  Filled 2012-04-23: qty 2
  Filled 2012-04-23 (×8): qty 1

## 2012-04-23 MED ORDER — SODIUM CHLORIDE 0.9 % IV SOLN
INTRAVENOUS | Status: DC
  Administered 2012-04-23 (×2): via INTRAVENOUS

## 2012-04-23 MED ORDER — MORPHINE SULFATE 2 MG/ML IJ SOLN
INTRAMUSCULAR | Status: AC
  Administered 2012-04-23: 2 mg via INTRAVENOUS
  Filled 2012-04-23: qty 1

## 2012-04-23 MED ORDER — FUROSEMIDE 20 MG PO TABS
20.0000 mg | ORAL_TABLET | Freq: Every day | ORAL | Status: DC
  Administered 2012-04-23 – 2012-04-28 (×6): 20 mg via ORAL
  Filled 2012-04-23 (×7): qty 1

## 2012-04-23 MED ORDER — EZETIMIBE 10 MG PO TABS
10.0000 mg | ORAL_TABLET | Freq: Every day | ORAL | Status: DC
  Administered 2012-04-23 – 2012-04-28 (×6): 10 mg via ORAL
  Filled 2012-04-23 (×6): qty 1

## 2012-04-23 MED ORDER — DEXTROSE 5 % IV SOLN
1.0000 g | Freq: Once | INTRAVENOUS | Status: AC
  Administered 2012-04-23: 1 g via INTRAVENOUS
  Filled 2012-04-23: qty 10

## 2012-04-23 MED ORDER — LEVOTHYROXINE SODIUM 75 MCG PO TABS
175.0000 ug | ORAL_TABLET | Freq: Every day | ORAL | Status: DC
  Administered 2012-04-23 – 2012-04-29 (×7): 175 ug via ORAL
  Filled 2012-04-23 (×7): qty 1

## 2012-04-23 MED ORDER — SIMVASTATIN 20 MG PO TABS
80.0000 mg | ORAL_TABLET | Freq: Every day | ORAL | Status: DC
  Administered 2012-04-23 – 2012-04-28 (×6): 80 mg via ORAL
  Filled 2012-04-23 (×6): qty 4

## 2012-04-23 MED ORDER — DEXTROSE 5 % IV SOLN
1.0000 g | INTRAVENOUS | Status: DC
  Administered 2012-04-24: 1 g via INTRAVENOUS
  Filled 2012-04-23 (×2): qty 10

## 2012-04-23 MED ORDER — POTASSIUM CHLORIDE CRYS ER 10 MEQ PO TBCR
10.0000 meq | EXTENDED_RELEASE_TABLET | Freq: Every day | ORAL | Status: DC
  Administered 2012-04-23 – 2012-04-29 (×7): 10 meq via ORAL
  Filled 2012-04-23 (×7): qty 1

## 2012-04-23 MED ORDER — ASPIRIN EC 81 MG PO TBEC
81.0000 mg | DELAYED_RELEASE_TABLET | Freq: Every day | ORAL | Status: DC
  Administered 2012-04-23 – 2012-04-29 (×7): 81 mg via ORAL
  Filled 2012-04-23 (×7): qty 1

## 2012-04-23 MED ORDER — OLOPATADINE HCL 0.1 % OP SOLN
1.0000 [drp] | Freq: Two times a day (BID) | OPHTHALMIC | Status: DC
  Administered 2012-04-23 – 2012-04-29 (×12): 1 [drp] via OPHTHALMIC
  Filled 2012-04-23: qty 5

## 2012-04-23 MED ORDER — CYCLOBENZAPRINE HCL 10 MG PO TABS
5.0000 mg | ORAL_TABLET | Freq: Three times a day (TID) | ORAL | Status: DC | PRN
  Administered 2012-04-23 – 2012-04-28 (×4): 5 mg via ORAL
  Filled 2012-04-23 (×4): qty 1

## 2012-04-23 MED ORDER — ENOXAPARIN SODIUM 40 MG/0.4ML ~~LOC~~ SOLN
40.0000 mg | SUBCUTANEOUS | Status: DC
  Administered 2012-04-23 – 2012-04-29 (×7): 40 mg via SUBCUTANEOUS
  Filled 2012-04-23 (×7): qty 0.4

## 2012-04-23 NOTE — ED Notes (Signed)
Pt also has scratches noted to forehead, bruising, swelling, abrasions and redness noted to left upper arm.  Pt has swelling and redness to right lower arm also.  Yellow colored band and yellow colored ring with multi colored stones removed from pt's left hand and given to pt's daughter.

## 2012-04-23 NOTE — Consult Note (Signed)
Reason for Consult: bilateral proximal humeral fractures  Referring Physician: DR Phillis Knack is an 78 y.o. female.   HPI:  78 year old these female with history of cornea artery disease with history of MI in past, COPD, hypertension, history of seizures, history of pulmonary nodule, anxiety, depression, active tobacco use, lives independently at home was brought to the ED sustaining a fall from her bed. She is in unaware of the incident and says she was sleeping and found herself on the floor but was unable to get up. Patient denies any seizure-like episodes or any weakness of her legs however felt weak on both her arms. She denies any bowel or urinary incontinence or any tongue bite. On arrival to the ED she had x-rays of her arms and knees which showed fracture of both her humerus minimal displacement of the left humerus. A head CT done was unremarkable. UA was done suggestive of UTI.  Patient complains off moderate pain in both her arms however has difficulty moving her wrist and feels numb in her distal forearm and her hands and fingers. As per family at bedside she has been having numbness in her fingers for several months now.  She denies any headache, blurry vision, chest pain, palpitations, abdominal pain, nausea, vomiting, bowel or urinary symptoms.  [facility] hospitalist called in for admission to medical floor. Orthopedic consult was called from the ED.  Past Medical History  Diagnosis Date  . Myocardial infarction     history of  . Coronary artery disease   . Leg edema     left  . Aneurysm     hx of  . COPD (chronic obstructive pulmonary disease)   . Hypertension   . Pulmonary nodule   . Obesity   . Anxiety   . Depression   . Hemoptysis   . Pressure ulcer of back     lower  . Tobacco abuse   . DJD (degenerative joint disease)   . Panic disorder   . Urinary incontinence   . Seizure disorder   . Ganglion cyst     left wrist  . Hypothyroidism   . Leg  edema   . DDD (degenerative disc disease), lumbosacral     Past Surgical History  Procedure Date  . Appendectomy   . Cholecystectomy   . Replacement total knee 11/2000    right   . Angioplasty     times 2    History reviewed. No pertinent family history.  Social History:  reports that she has been smoking.  She has never used smokeless tobacco. She reports that she does not drink alcohol or use illicit drugs.  Allergies: No Known Allergies  Medications: I have reviewed the patient's current medications.  Results for orders placed during the hospital encounter of 04/23/12 (from the past 48 hour(s))  URINALYSIS, ROUTINE W REFLEX MICROSCOPIC     Status: Abnormal   Collection Time   04/23/12  7:35 AM      Component Value Range Comment   Color, Urine YELLOW  YELLOW    APPearance HAZY (*) CLEAR    Specific Gravity, Urine 1.030  1.005 - 1.030    pH 5.5  5.0 - 8.0    Glucose, UA NEGATIVE  NEGATIVE mg/dL    Hgb urine dipstick LARGE (*) NEGATIVE    Bilirubin Urine NEGATIVE  NEGATIVE    Ketones, ur NEGATIVE  NEGATIVE mg/dL    Protein, ur 30 (*) NEGATIVE mg/dL    Urobilinogen,  UA 0.2  0.0 - 1.0 mg/dL    Nitrite NEGATIVE  NEGATIVE    Leukocytes, UA MODERATE (*) NEGATIVE   URINE MICROSCOPIC-ADD ON     Status: Abnormal   Collection Time   04/23/12  7:35 AM      Component Value Range Comment   Squamous Epithelial / LPF RARE  RARE    WBC, UA TOO NUMEROUS TO COUNT  <3 WBC/hpf    RBC / HPF TOO NUMEROUS TO COUNT  <3 RBC/hpf    Bacteria, UA MANY (*) RARE   CBC WITH DIFFERENTIAL     Status: Abnormal   Collection Time   04/23/12  7:45 AM      Component Value Range Comment   WBC 11.9 (*) 4.0 - 10.5 K/uL    RBC 4.74  3.87 - 5.11 MIL/uL    Hemoglobin 13.9  12.0 - 15.0 g/dL    HCT 78.2  95.6 - 21.3 %    MCV 89.5  78.0 - 100.0 fL    MCH 29.3  26.0 - 34.0 pg    MCHC 32.8  30.0 - 36.0 g/dL    RDW 08.6  57.8 - 46.9 %    Platelets 273  150 - 400 K/uL    Neutrophils Relative 77  43 - 77 %     Neutro Abs 9.2 (*) 1.7 - 7.7 K/uL    Lymphocytes Relative 10 (*) 12 - 46 %    Lymphs Abs 1.2  0.7 - 4.0 K/uL    Monocytes Relative 13 (*) 3 - 12 %    Monocytes Absolute 1.6 (*) 0.1 - 1.0 K/uL    Eosinophils Relative 0  0 - 5 %    Eosinophils Absolute 0.0  0.0 - 0.7 K/uL    Basophils Relative 0  0 - 1 %    Basophils Absolute 0.0  0.0 - 0.1 K/uL   BASIC METABOLIC PANEL     Status: Abnormal   Collection Time   04/23/12  7:45 AM      Component Value Range Comment   Sodium 142  135 - 145 mEq/L    Potassium 3.4 (*) 3.5 - 5.1 mEq/L    Chloride 103  96 - 112 mEq/L    CO2 27  19 - 32 mEq/L    Glucose, Bld 126 (*) 70 - 99 mg/dL    BUN 17  6 - 23 mg/dL    Creatinine, Ser 6.29  0.50 - 1.10 mg/dL    Calcium 52.8  8.4 - 10.5 mg/dL    GFR calc non Af Amer 63 (*) >90 mL/min    GFR calc Af Amer 72 (*) >90 mL/min   TROPONIN I     Status: Normal   Collection Time   04/23/12  7:45 AM      Component Value Range Comment   Troponin I <0.30  <0.30 ng/mL     Dg Chest 1 View  04/23/2012  *RADIOLOGY REPORT*  Clinical Data: Fall  CHEST - 1 VIEW  Comparison: 09/20/2010  Findings: Heart is borderline enlarged.  Vascular congestion without interstitial edema.  Aortic knob is distinct.  No pneumothorax.  No consolidation.  Persistent elevation of the left hemidiaphragm.  No obvious acute bony deformity.  IMPRESSION: Borderline cardiomegaly without edema.   Original Report Authenticated By: Donavan Burnet, M.D.    Dg Shoulder Right  04/23/2012  *RADIOLOGY REPORT*  Clinical Data: Fall  RIGHT SHOULDER - 2+ VIEW  Comparison: None.  Findings: There is  lucency extending across the humeral head. Nondisplaced fracture is not excluded.  Normal obvious cortical step off.  There is apparent impaction of the humeral neck into the humeral head associated with a sclerotic band which may also represent a fracture  IMPRESSION: Above findings are worrisome for a nondisplaced and impaction fracture of the humeral neck and head.    Original Report Authenticated By: Donavan Burnet, M.D.    Dg Forearm Right  04/23/2012  *RADIOLOGY REPORT*  Clinical Data: Fall, pain.  RIGHT FOREARM - 2 VIEW  Comparison: None.  Findings: Degenerative changes in the right wrist and right elbow. No acute bony abnormality.  No fracture, subluxation or dislocation.  Soft tissues are intact.  IMPRESSION: No acute bony abnormality.   Original Report Authenticated By: Cyndie Chime, M.D.    Ct Head Wo Contrast  04/23/2012  *RADIOLOGY REPORT*  Clinical Data:  Fall.  Abrasions to forehead.  CT HEAD WITHOUT CONTRAST CT CERVICAL SPINE WITHOUT CONTRAST  Technique:  Multidetector CT imaging of the head and cervical spine was performed following the standard protocol without intravenous contrast.  Multiplanar CT image reconstructions of the cervical spine were also generated.  Comparison:  None.  CT HEAD  Findings: There is atrophy and chronic small vessel disease changes.  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  Old right thalamic lacunar infarct, stable. No acute intracranial abnormality.  Specifically, no hemorrhage, hydrocephalus, mass lesion, acute infarction, or significant intracranial injury.  No acute calvarial abnormality. Visualized paranasal sinuses and mastoids clear.  Orbital soft tissues unremarkable.  IMPRESSION: No acute intracranial abnormality.  CT CERVICAL SPINE  Findings: Diffuse degenerative disc disease and facet disease. Normal alignment.  Prevertebral soft tissues are normal.  No fracture, epidural or paraspinal hematoma.  IMPRESSION: Diffuse degenerative changes.  No acute bony abnormality.   Original Report Authenticated By: Cyndie Chime, M.D.    Ct Cervical Spine Wo Contrast  04/23/2012  *RADIOLOGY REPORT*  Clinical Data:  Fall.  Abrasions to forehead.  CT HEAD WITHOUT CONTRAST CT CERVICAL SPINE WITHOUT CONTRAST  Technique:  Multidetector CT imaging of the head and cervical spine was performed following the standard protocol without  intravenous contrast.  Multiplanar CT image reconstructions of the cervical spine were also generated.  Comparison:  None.  CT HEAD  Findings: There is atrophy and chronic small vessel disease changes.  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  Old right thalamic lacunar infarct, stable. No acute intracranial abnormality.  Specifically, no hemorrhage, hydrocephalus, mass lesion, acute infarction, or significant intracranial injury.  No acute calvarial abnormality. Visualized paranasal sinuses and mastoids clear.  Orbital soft tissues unremarkable.  IMPRESSION: No acute intracranial abnormality.  CT CERVICAL SPINE  Findings: Diffuse degenerative disc disease and facet disease. Normal alignment.  Prevertebral soft tissues are normal.  No fracture, epidural or paraspinal hematoma.  IMPRESSION: Diffuse degenerative changes.  No acute bony abnormality.   Original Report Authenticated By: Cyndie Chime, M.D.    Dg Shoulder Left  04/23/2012  *RADIOLOGY REPORT*  Clinical Data: Fall  LEFT SHOULDER - 2+ VIEW  Comparison: 12/13/2004  Findings: Minimally displaced fracture involving the articular surface of the lateral humeral head is present.  This likely represents a greater tuberosity fragment.  Degenerative changes are superimposed with prominent inferior AC joint osteophytes. Prominent degenerative change of the glenohumeral joint.  IMPRESSION: Acute minimally displaced fracture of the humeral head.  This likely represents the greater tuberosity.   Original Report Authenticated By: Donavan Burnet, M.D.  Dg Knee Complete 4 Views Right  04/23/2012  *RADIOLOGY REPORT*  Clinical Data: Fall  RIGHT KNEE - COMPLETE 4+ VIEW  Comparison: None.  Findings: Total knee are there is no breakage or loosening of the hardware.  No acute fracture.  No dislocation.  IMPRESSION: Total knee arthroplasty anatomically aligned.  No acute bony pathology.   Original Report Authenticated By: Donavan Burnet, M.D.    Dg Humerus  Left  04/23/2012  *RADIOLOGY REPORT*  Clinical Data: Fall, chest pain, shoulder pain.  LEFT HUMERUS - 2+ VIEW  Comparison: None.  Findings: Advanced degenerative changes in the left shoulder. No acute bony abnormality.  Specifically, no fracture, subluxation, or dislocation.  Soft tissues are intact.  IMPRESSION: No acute bony abnormality.   Original Report Authenticated By: Cyndie Chime, M.D.     Review of Systems  Unable to perform ROS: other   Blood pressure 167/95, pulse 82, temperature 98.1 F (36.7 C), temperature source Oral, resp. rate 18, height 5\' 8"  (1.727 m), weight 208 lb 12.4 oz (94.7 kg), SpO2 98.00%. Physical Exam  Constitutional: She appears well-developed and well-nourished.       OBESITY    HENT:  Head: Normocephalic.  Neck: No JVD present. No tracheal deviation present. No thyromegaly present.  Cardiovascular: Normal rate and intact distal pulses.   Respiratory: Effort normal. No respiratory distress.  GI: She exhibits no distension.  Musculoskeletal:       Right shoulder: She exhibits decreased range of motion, tenderness, bony tenderness, swelling, pain and decreased strength. She exhibits no effusion, no crepitus, no deformity, no laceration, no spasm and normal pulse.       Left shoulder: She exhibits decreased range of motion, tenderness, bony tenderness, swelling, pain and decreased strength. She exhibits no deformity and no laceration.       Right wrist: She exhibits decreased range of motion, tenderness, bony tenderness and swelling. She exhibits no deformity and no laceration.       LOWER EXTREMITIES INSPECTION NO DEFORMITY ROM NO MAJOR CONTRACTURE MOTOR NO ATROPHY STABILITY: ALL JOINTS REDUCED    Lymphadenopathy:    She has no cervical adenopathy.  Neurological:       COULD NOT ASSESS 2ND TO SEDATION    Skin: Skin is warm and dry.  Psychiatric:       SEDATED     Assessment/Plan: XRAYS REVIEWED  L AND R SHOULDER RIGHT FOREARM RIGHT KNEE    PROXIMAL HUMERUS FRACTURE LEFT AND RIGHT  ? WRIST DROP   RIGHT WRIST SPLIT  B/L WRIST SPLINTS  DISPOSITION PLAN  NEUROLOGY WILL SEE   Fuller Canada 04/23/2012, 3:50 PM

## 2012-04-23 NOTE — ED Notes (Signed)
Patient presents to ER via EMS.  EMS states responded to Life Alert; found face down after rolling out of bed.  Patient with bruising and abrasions noted to bilateral knees and bilateral arms/shoulders.  Patient also c/o chest pain.

## 2012-04-23 NOTE — ED Notes (Signed)
Patient on floor undetermined amount of time, noted to have swelling of arms, legs, left axillary area reddened and bruised - EMS reports this arm around a table leg.  Patient was on hard wood floors - EMS had to wait for police to break into house to access patient.  C/o being cold, and hurts all over.  Bilateral knee abrasions.  Patient was incontinent on floor and arrived with wet clothing on - gown cut off.  Warm blankets applied.

## 2012-04-23 NOTE — ED Provider Notes (Signed)
History    This chart was scribed for Sara Hutching, MD, MD by Smitty Pluck. The patient was seen in room APA04 and the patient's care was started at 7:22AM.   CSN: [id number]  Arrival date & time 04/23/12  0620   None     Chief Complaint  Patient presents with  . Fall  . Chest Pain  . Arm Pain    (Consider location/radiation/quality/duration/timing/severity/associated sxs/prior treatment) Patient is a 78 y.o. female presenting with fall, chest pain, and arm pain. The history is provided by the patient. No language interpreter was used.  Fall  Chest Pain    Arm Pain  Pt is level 5 caveat for urgent need for intervention.  [name] is a 78 y.o. female who presents to the Emergency Department  [facility] complaining of fall causing moderate chest wall pain, bilateral shoulder pain and moderate numbness in right arm onset today this AM. EMS found pt in the floor. Pt fell out of bed this morning. Pt's daughters think that pt hit her right arm on the table beside her bed when she fell out of bed. Daughters reports that pt usually uses walker at home.   Past Medical History  Diagnosis Date  . Myocardial infarction     history of  . Coronary artery disease   . Leg edema     left  . Aneurysm     hx of  . COPD (chronic obstructive pulmonary disease)   . Hypertension   . Pulmonary nodule   . Obesity   . Anxiety   . Depression   . Hemoptysis   . Pressure ulcer of back     lower  . Tobacco abuse   . DJD (degenerative joint disease)   . Panic disorder   . Urinary incontinence   . Seizure disorder   . Ganglion cyst     left wrist  . Hypothyroidism   . Leg edema   . DDD (degenerative disc disease), lumbosacral     Past Surgical History  Procedure Date  . Appendectomy   . Cholecystectomy   . Replacement total knee 11/2000    right   . Angioplasty     times 2    No family history on file.  History  Substance Use Topics  . Smoking status: Current Some Day  Smoker  . Smokeless tobacco: Never Used  . Alcohol Use: No    OB History    Grav Para Term Preterm Abortions TAB SAB Ect Mult Living                  Review of Systems  Unable to perform ROS: Other    Allergies  Review of patient's allergies indicates no known allergies.  Home Medications   Current Outpatient Rx  Name Route Sig Dispense Refill  . ALPRAZOLAM 0.25 MG PO TABS Oral Take 1 tablet (0.25 mg total) by mouth at bedtime as needed. 30 tablet 1  . AMLODIPINE BESYLATE-VALSARTAN 5-160 MG PO TABS Oral Take 1 tablet by mouth daily.      Marland Kitchen EZETIMIBE-SIMVASTATIN 10-80 MG PO TABS Oral Take 1 tablet by mouth at bedtime.      Marland Kitchen FOLIC ACID 1 MG PO TABS Oral Take 1 mg by mouth daily.    . FUROSEMIDE 40 MG PO TABS Oral Take 40 mg by mouth daily.      Marland Kitchen LEVETIRACETAM 250 MG PO TABS Oral Take 250 mg by mouth 2 (two) times daily.    Marland Kitchen  LEVOTHYROXINE SODIUM 175 MCG PO TABS Oral Take 175 mcg by mouth daily.      Marland Kitchen POTASSIUM CHLORIDE ER 10 MEQ PO TBCR Oral Take 1 tablet (10 mEq total) by mouth daily. 30 tablet 3    BP 152/82  Pulse 72  Temp 98.3 F (36.8 C) (Oral)  Resp 20  SpO2 98%  Physical Exam  Nursing note and vitals reviewed. Constitutional: She is oriented to person, place, and time. She appears well-developed and well-nourished.  HENT:  Head: Normocephalic and atraumatic.  Eyes: Conjunctivae and EOM are normal. Pupils are equal, round, and reactive to light.  Neck: Normal range of motion. Neck supple.  Cardiovascular: Normal rate, regular rhythm and normal heart sounds.   Pulmonary/Chest: Effort normal and breath sounds normal. She exhibits tenderness (left chest wall).  Abdominal: Soft. Bowel sounds are normal.  Musculoskeletal: Normal range of motion.       Left and right shoulder tenderness  Left humerus tenderness  Abrasion on proximal medial left humerus  Right knee tenderness  Right forearm tenderness    Neurological: She is alert and oriented to person, place,  and time.  Skin: Skin is warm and dry.  Psychiatric: She has a normal mood and affect. Her behavior is normal.    ED Course  Procedures (including critical care time) DIAGNOSTIC STUDIES: Oxygen Saturation is 98% on room air, normal by my interpretation.    COORDINATION OF CARE: 7:28 AM Discussed pt ED treatment with pt. (xray of Chest, forearm, bilateral shoulders, humerus, CT head, CT cervical spine)  7:50 AM Ordered:   Medications  Vitamin D, Ergocalciferol, (DRISDOL) 50000 UNITS CAPS (not administered)  0.9 %  sodium chloride infusion (  Intravenous New Bag/Given 04/23/12 0715)       Labs Reviewed  URINALYSIS, ROUTINE W REFLEX MICROSCOPIC - Abnormal; Notable for the following:    APPearance HAZY (*)     Hgb urine dipstick LARGE (*)     Protein, ur 30 (*)     Leukocytes, UA MODERATE (*)     All other components within normal limits  URINE MICROSCOPIC-ADD ON - Abnormal; Notable for the following:    Bacteria, UA MANY (*)     All other components within normal limits  CBC WITH DIFFERENTIAL - Abnormal; Notable for the following:    WBC 11.9 (*)     Neutro Abs 9.2 (*)     Lymphocytes Relative 10 (*)     Monocytes Relative 13 (*)     Monocytes Absolute 1.6 (*)     All other components within normal limits  BASIC METABOLIC PANEL - Abnormal; Notable for the following:    Potassium 3.4 (*)     Glucose, Bld 126 (*)     GFR calc non Af Amer 63 (*)     GFR calc Af Amer 72 (*)     All other components within normal limits  TROPONIN I   Dg Chest 1 View  04/23/2012  *RADIOLOGY REPORT*  Clinical Data: Fall  CHEST - 1 VIEW  Comparison: 09/20/2010  Findings: Heart is borderline enlarged.  Vascular congestion without interstitial edema.  Aortic knob is distinct.  No pneumothorax.  No consolidation.  Persistent elevation of the left hemidiaphragm.  No obvious acute bony deformity.  IMPRESSION: Borderline cardiomegaly without edema.   Original Report Authenticated By: Donavan Burnet,  M.D.    Dg Shoulder Right  04/23/2012  *RADIOLOGY REPORT*  Clinical Data: Fall  RIGHT SHOULDER - 2+ VIEW  Comparison:  None.  Findings: There is lucency extending across the humeral head. Nondisplaced fracture is not excluded.  Normal obvious cortical step off.  There is apparent impaction of the humeral neck into the humeral head associated with a sclerotic band which may also represent a fracture  IMPRESSION: Above findings are worrisome for a nondisplaced and impaction fracture of the humeral neck and head.   Original Report Authenticated By: Donavan Burnet, M.D.    Dg Forearm Right  04/23/2012  *RADIOLOGY REPORT*  Clinical Data: Fall, pain.  RIGHT FOREARM - 2 VIEW  Comparison: None.  Findings: Degenerative changes in the right wrist and right elbow. No acute bony abnormality.  No fracture, subluxation or dislocation.  Soft tissues are intact.  IMPRESSION: No acute bony abnormality.   Original Report Authenticated By: Cyndie Chime, M.D.    Ct Head Wo Contrast  04/23/2012  *RADIOLOGY REPORT*  Clinical Data:  Fall.  Abrasions to forehead.  CT HEAD WITHOUT CONTRAST CT CERVICAL SPINE WITHOUT CONTRAST  Technique:  Multidetector CT imaging of the head and cervical spine was performed following the standard protocol without intravenous contrast.  Multiplanar CT image reconstructions of the cervical spine were also generated.  Comparison:  None.  CT HEAD  Findings: There is atrophy and chronic small vessel disease changes.  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  Old right thalamic lacunar infarct, stable. No acute intracranial abnormality.  Specifically, no hemorrhage, hydrocephalus, mass lesion, acute infarction, or significant intracranial injury.  No acute calvarial abnormality. Visualized paranasal sinuses and mastoids clear.  Orbital soft tissues unremarkable.  IMPRESSION: No acute intracranial abnormality.  CT CERVICAL SPINE  Findings: Diffuse degenerative disc disease and facet disease. Normal  alignment.  Prevertebral soft tissues are normal.  No fracture, epidural or paraspinal hematoma.  IMPRESSION: Diffuse degenerative changes.  No acute bony abnormality.   Original Report Authenticated By: Cyndie Chime, M.D.    Ct Cervical Spine Wo Contrast  04/23/2012  *RADIOLOGY REPORT*  Clinical Data:  Fall.  Abrasions to forehead.  CT HEAD WITHOUT CONTRAST CT CERVICAL SPINE WITHOUT CONTRAST  Technique:  Multidetector CT imaging of the head and cervical spine was performed following the standard protocol without intravenous contrast.  Multiplanar CT image reconstructions of the cervical spine were also generated.  Comparison:  None.  CT HEAD  Findings: There is atrophy and chronic small vessel disease changes.  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  Old right thalamic lacunar infarct, stable. No acute intracranial abnormality.  Specifically, no hemorrhage, hydrocephalus, mass lesion, acute infarction, or significant intracranial injury.  No acute calvarial abnormality. Visualized paranasal sinuses and mastoids clear.  Orbital soft tissues unremarkable.  IMPRESSION: No acute intracranial abnormality.  CT CERVICAL SPINE  Findings: Diffuse degenerative disc disease and facet disease. Normal alignment.  Prevertebral soft tissues are normal.  No fracture, epidural or paraspinal hematoma.  IMPRESSION: Diffuse degenerative changes.  No acute bony abnormality.   Original Report Authenticated By: Cyndie Chime, M.D.    Dg Shoulder Left  04/23/2012  *RADIOLOGY REPORT*  Clinical Data: Fall  LEFT SHOULDER - 2+ VIEW  Comparison: 12/13/2004  Findings: Minimally displaced fracture involving the articular surface of the lateral humeral head is present.  This likely represents a greater tuberosity fragment.  Degenerative changes are superimposed with prominent inferior AC joint osteophytes. Prominent degenerative change of the glenohumeral joint.  IMPRESSION: Acute minimally displaced fracture of the humeral head.   This likely represents the greater tuberosity.   Original Report Authenticated By: Merton Border  Joseph Art, M.D.    Dg Knee Complete 4 Views Right  04/23/2012  *RADIOLOGY REPORT*  Clinical Data: Fall  RIGHT KNEE - COMPLETE 4+ VIEW  Comparison: None.  Findings: Total knee are there is no breakage or loosening of the hardware.  No acute fracture.  No dislocation.  IMPRESSION: Total knee arthroplasty anatomically aligned.  No acute bony pathology.   Original Report Authenticated By: Donavan Burnet, M.D.    Dg Humerus Left  04/23/2012  *RADIOLOGY REPORT*  Clinical Data: Fall, chest pain, shoulder pain.  LEFT HUMERUS - 2+ VIEW  Comparison: None.  Findings: Advanced degenerative changes in the left shoulder. No acute bony abnormality.  Specifically, no fracture, subluxation, or dislocation.  Soft tissues are intact.  IMPRESSION: No acute bony abnormality.   Original Report Authenticated By: Cyndie Chime, M.D.    9:56 AM Recheck: Discussed lab results and treatment course with pt. Pt will be admitted.    No diagnosis found.   Date: 04/23/2012  Rate: 77  Rhythm: normal sinus rhythm  QRS Axis: normal  Intervals: normal  ST/T Wave abnormalities: normal  Conduction Disutrbances:right bundle branch block  Narrative Interpretation:   Old EKG Reviewed: changes noted   CRITICAL CARE Performed by: [name]   Total critical care time: 30  Critical care time was exclusive of separately billable procedures and treating other patients.  Critical care was necessary to treat or prevent imminent or life-threatening deterioration.  Critical care was time spent personally by me on the following activities: development of treatment plan with patient and/or surrogate as well as nursing, discussions with consultants, evaluation of patient's response to treatment, examination of patient, obtaining history from patient or surrogate, ordering and performing treatments and interventions, ordering and review of  laboratory studies, ordering and review of radiographic studies, pulse oximetry and re-evaluation of patient's condition.  MDM  No frank neuro deficits. Multiple x-rays show bilateral proximal humeral fractures.  Discussed with Dr. Romeo Apple orthopedic surgeon who will consult. Admit to general medicine.  Discussed in great detail with 2 daughters.  Urinary tract infection noted. Start IV Rocephin. Culture urine   I personally performed the services described in this documentation, which was scribed in my presence. The recorded information has been reviewed and considered.    Sara Hutching, MD 04/23/12 1047

## 2012-04-23 NOTE — ED Notes (Signed)
Patient transported to X-ray 

## 2012-04-23 NOTE — H&P (Signed)
[facility] History and Physical  [name]:[id number] DOB: February 01, 1919 DOA: 04/23/2012  Referring physician: Dr Adriana Simas PCP: Milinda Antis, MD   Chief Complaint: fall at home  HPI:  78 year old these female with history of cornea artery disease with history of MI in past, COPD, hypertension, history of seizures, history of pulmonary nodule, anxiety, depression, active tobacco use, lives independently at home was brought to the ED sustaining a fall from her bed. She is in unaware of the incident and says she was sleeping and found herself on the floor but was unable to get up. Patient denies any seizure-like episodes or any weakness of her legs however felt weak on both her arms. She denies any bowel or urinary incontinence or any tongue bite. On arrival to the ED she had x-rays of her arms and knees which showed fracture of both her humerus minimal displacement of the left humerus. A head CT done was unremarkable. UA was done suggestive of UTI. Patient complains off moderate pain in both her arms however has difficulty moving her wrist and feels numb in her distal forearm and her hands and fingers. As per family at bedside she has been having numbness in her fingers for several months now. She denies any headache, blurry vision, chest pain, palpitations, abdominal pain, nausea, vomiting, bowel or urinary symptoms. [facility] called in for admission to medical floor. Orthopedic consult was called from the ED.  Review of Systems:  Constitutional: Denies fever, chills, diaphoresis, appetite change and fatigue.  HEENT: Denies photophobia, eye pain, redness, hearing loss, ear pain, congestion, sore throat, rhinorrhea, sneezing, mouth sores, trouble swallowing, neck pain, neck stiffness and tinnitus.   Respiratory: Denies SOB, DOE, cough, chest tightness,  and wheezing.   Cardiovascular: Denies chest pain, palpitations and leg swelling.  Gastrointestinal: Denies nausea, vomiting,  abdominal pain, diarrhea, constipation, blood in stool and abdominal distention.  Genitourinary: Denies dysuria, urgency, frequency, hematuria, flank pain and difficulty urinating.  Musculoskeletal: Pain over bilateral arms with difficulty extending her right wrist and numbness over her distal right forearm hand and fingers. Denies myalgias, back pain, joint swelling, arthralgias and gait problem.  Skin: Denies pallor, rash and wound.  Neurological: Numbness over her right distal forearm hand and fingers. Denies dizziness, seizures, syncope, weakness, light-headedness, and headaches.  Hematological: Denies adenopathy. Easy bruising, personal or family bleeding history  Psychiatric/Behavioral: Denies suicidal ideation, mood changes, confusion, nervousness, sleep disturbance and agitation   Past Medical History  Diagnosis Date  . Myocardial infarction     history of  . Coronary artery disease   . Leg edema     left  . Aneurysm     hx of  . COPD (chronic obstructive pulmonary disease)   . Hypertension   . Pulmonary nodule   . Obesity   . Anxiety   . Depression   . Hemoptysis   . Pressure ulcer of back     lower  . Tobacco abuse   . DJD (degenerative joint disease)   . Panic disorder   . Urinary incontinence   . Seizure disorder   . Ganglion cyst     left wrist  . Hypothyroidism   . Leg edema   . DDD (degenerative disc disease), lumbosacral    Past Surgical History  Procedure Date  . Appendectomy   . Cholecystectomy   . Replacement total knee 11/2000    right   . Angioplasty     times 2   Social History:  reports that  she has been smoking.  She has never used smokeless tobacco. She reports that she does not drink alcohol or use illicit drugs.  No Known Allergies  History reviewed. No pertinent family history.  Prior to Admission medications   Medication Sig Start Date End Date Taking? Authorizing Provider  ALPRAZolam (XANAX) 0.25 MG tablet Take 1 tablet (0.25 mg  total) by mouth at bedtime as needed. 03/07/12  Yes Salley Scarlet, MD  aspirin EC 81 MG tablet Take 81 mg by mouth daily.   Yes Historical Provider, MD  ezetimibe-simvastatin (VYTORIN) 10-80 MG per tablet Take 1 tablet by mouth at bedtime.     Yes Historical Provider, MD  folic acid (FOLVITE) 1 MG tablet Take 1 mg by mouth daily.   Yes Historical Provider, MD  furosemide (LASIX) 40 MG tablet Take 20-40 mg by mouth daily as needed. Swelling   Yes Historical Provider, MD  levETIRAcetam (KEPPRA) 250 MG tablet Take 250 mg by mouth 2 (two) times daily.   Yes Historical Provider, MD  levothyroxine (SYNTHROID, LEVOTHROID) 175 MCG tablet Take 175 mcg by mouth daily.     Yes Historical Provider, MD  Olopatadine HCl (PATADAY OP) Apply 1 drop to eye daily as needed. Itching   Yes Historical Provider, MD  potassium chloride (K-DUR) 10 MEQ tablet Take 1 tablet (10 mEq total) by mouth daily. 03/07/12  Yes Salley Scarlet, MD  Vitamin D, Ergocalciferol, (DRISDOL) 50000 UNITS CAPS Take 50,000 Units by mouth every 7 (seven) days. Usually on Sundays   Yes Historical Provider, MD    Physical Exam:  Filed Vitals:   04/23/12 0800 04/23/12 0934 04/23/12 1027 04/23/12 1100  BP: 159/79 157/84 165/93 188/92  Pulse: 75 81 90   Temp:      TempSrc:      Resp: 17   24  SpO2: 97% 97% 91%     Constitutional: Vital signs reviewed.  Patient is a well-developed and well-nourished in no acute distress and cooperative with exam. Alert and oriented x3.  Head: Normocephalic and atraumatic Ear: TM normal bilaterally Mouth: no erythema or exudates, MMM Eyes: PERRL, EOMI, conjunctivae normal, No scleral icterus.  Neck: Supple, Trachea midline normal ROM, No JVD, mass, thyromegaly, or carotid bruit present.  Cardiovascular: RRR, S1 normal, S2 normal, no MRG, pulses symmetric and intact bilaterally Pulmonary/Chest: CTAB, no wheezes, rales, or rhonchi Abdominal: Soft. Non-tender, non-distended, bowel sounds are normal, no  masses, organomegaly, or guarding present.  GU: no CVA tenderness Musculoskeletal: No joint deformities, erythema, or stiffness, ROM full and no nontender Ext: Sling over bilateral arms with limited range of motion of bilateral arms. Right wrist drop noted with some edema. Decreased finger grasp over right hand. Numbness over the right hand extending from distal forearm present for fingers. Good radial pulsation bilaterally.  no leg edema and no cyanosis, Hematology: no cervical, inginal, or axillary adenopathy.  Neurological: A&O x3, Strenght is normal and symmetric bilaterally, cranial nerve II-XII are grossly intact, no focal motor deficit, sensory intact to light touch bilaterally.  Skin: Warm, dry and intact. No rash, cyanosis, or clubbing.  Psychiatric: Normal mood and affect. speech and behavior is normal. Judgment and thought content normal. Cognition and memory are normal.   Labs on Admission:  Basic Metabolic Panel:  Lab 04/23/12 1610  NA 142  K 3.4*  CL 103  CO2 27  GLUCOSE 126*  BUN 17  CREATININE 0.80  CALCIUM 10.1  MG --  PHOS --   Liver Function Tests:  No results found for this basename: AST:5,ALT:5,ALKPHOS:5,BILITOT:5,PROT:5,ALBUMIN:5 in the last 168 hours No results found for this basename: LIPASE:5,AMYLASE:5 in the last 168 hours No results found for this basename: AMMONIA:5 in the last 168 hours CBC:  Lab 04/23/12 0745  WBC 11.9*  NEUTROABS 9.2*  HGB 13.9  HCT 42.4  MCV 89.5  PLT 273   Cardiac Enzymes:  Lab 04/23/12 0745  CKTOTAL --  CKMB --  CKMBINDEX --  TROPONINI <0.30   BNP: No components found with this basename: POCBNP:5 CBG: No results found for this basename: GLUCAP:5 in the last 168 hours  Radiological Exams on Admission: Dg Chest 1 View  04/23/2012  *RADIOLOGY REPORT*  Clinical Data: Fall  CHEST - 1 VIEW  Comparison: 09/20/2010  Findings: Heart is borderline enlarged.  Vascular congestion without interstitial edema.  Aortic knob is  distinct.  No pneumothorax.  No consolidation.  Persistent elevation of the left hemidiaphragm.  No obvious acute bony deformity.  IMPRESSION: Borderline cardiomegaly without edema.   Original Report Authenticated By: Donavan Burnet, M.D.    Dg Shoulder Right  04/23/2012  *RADIOLOGY REPORT*  Clinical Data: Fall  RIGHT SHOULDER - 2+ VIEW  Comparison: None.  Findings: There is lucency extending across the humeral head. Nondisplaced fracture is not excluded.  Normal obvious cortical step off.  There is apparent impaction of the humeral neck into the humeral head associated with a sclerotic band which may also represent a fracture  IMPRESSION: Above findings are worrisome for a nondisplaced and impaction fracture of the humeral neck and head.   Original Report Authenticated By: Donavan Burnet, M.D.    Dg Forearm Right  04/23/2012  *RADIOLOGY REPORT*  Clinical Data: Fall, pain.  RIGHT FOREARM - 2 VIEW  Comparison: None.  Findings: Degenerative changes in the right wrist and right elbow. No acute bony abnormality.  No fracture, subluxation or dislocation.  Soft tissues are intact.  IMPRESSION: No acute bony abnormality.   Original Report Authenticated By: Cyndie Chime, M.D.    Ct Head Wo Contrast  04/23/2012  *RADIOLOGY REPORT*  Clinical Data:  Fall.  Abrasions to forehead.  CT HEAD WITHOUT CONTRAST CT CERVICAL SPINE WITHOUT CONTRAST  Technique:  Multidetector CT imaging of the head and cervical spine was performed following the standard protocol without intravenous contrast.  Multiplanar CT image reconstructions of the cervical spine were also generated.  Comparison:  None.  CT HEAD  Findings: There is atrophy and chronic small vessel disease changes.  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  Old right thalamic lacunar infarct, stable. No acute intracranial abnormality.  Specifically, no hemorrhage, hydrocephalus, mass lesion, acute infarction, or significant intracranial injury.  No acute calvarial  abnormality. Visualized paranasal sinuses and mastoids clear.  Orbital soft tissues unremarkable.  IMPRESSION: No acute intracranial abnormality.  CT CERVICAL SPINE  Findings: Diffuse degenerative disc disease and facet disease. Normal alignment.  Prevertebral soft tissues are normal.  No fracture, epidural or paraspinal hematoma.  IMPRESSION: Diffuse degenerative changes.  No acute bony abnormality.   Original Report Authenticated By: Cyndie Chime, M.D.    Ct Cervical Spine Wo Contrast  04/23/2012  *RADIOLOGY REPORT*  Clinical Data:  Fall.  Abrasions to forehead.  CT HEAD WITHOUT CONTRAST CT CERVICAL SPINE WITHOUT CONTRAST  Technique:  Multidetector CT imaging of the head and cervical spine was performed following the standard protocol without intravenous contrast.  Multiplanar CT image reconstructions of the cervical spine were also generated.  Comparison:  None.  CT  HEAD  Findings: There is atrophy and chronic small vessel disease changes.  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  Old right thalamic lacunar infarct, stable. No acute intracranial abnormality.  Specifically, no hemorrhage, hydrocephalus, mass lesion, acute infarction, or significant intracranial injury.  No acute calvarial abnormality. Visualized paranasal sinuses and mastoids clear.  Orbital soft tissues unremarkable.  IMPRESSION: No acute intracranial abnormality.  CT CERVICAL SPINE  Findings: Diffuse degenerative disc disease and facet disease. Normal alignment.  Prevertebral soft tissues are normal.  No fracture, epidural or paraspinal hematoma.  IMPRESSION: Diffuse degenerative changes.  No acute bony abnormality.   Original Report Authenticated By: Cyndie Chime, M.D.    Dg Shoulder Left  04/23/2012  *RADIOLOGY REPORT*  Clinical Data: Fall  LEFT SHOULDER - 2+ VIEW  Comparison: 12/13/2004  Findings: Minimally displaced fracture involving the articular surface of the lateral humeral head is present.  This likely represents a  greater tuberosity fragment.  Degenerative changes are superimposed with prominent inferior AC joint osteophytes. Prominent degenerative change of the glenohumeral joint.  IMPRESSION: Acute minimally displaced fracture of the humeral head.  This likely represents the greater tuberosity.   Original Report Authenticated By: Donavan Burnet, M.D.    Dg Knee Complete 4 Views Right  04/23/2012  *RADIOLOGY REPORT*  Clinical Data: Fall  RIGHT KNEE - COMPLETE 4+ VIEW  Comparison: None.  Findings: Total knee are there is no breakage or loosening of the hardware.  No acute fracture.  No dislocation.  IMPRESSION: Total knee arthroplasty anatomically aligned.  No acute bony pathology.   Original Report Authenticated By: Donavan Burnet, M.D.    Dg Humerus Left  04/23/2012  *RADIOLOGY REPORT*  Clinical Data: Fall, chest pain, shoulder pain.  LEFT HUMERUS - 2+ VIEW  Comparison: None.  Findings: Advanced degenerative changes in the left shoulder. No acute bony abnormality.  Specifically, no fracture, subluxation, or dislocation.  Soft tissues are intact.  IMPRESSION: No acute bony abnormality.   Original Report Authenticated By: Cyndie Chime, M.D.       Assessment/Plan Principal Problem:  *Fall with fracture of bilateral humerus Admitted to medical floor. Her fall slightly mechanical however she is unable to recall the incident and fell out of bed. Admission head CT is unremarkable. Given history of CAD and active smoking the possibility of underlying TIA versus CVA cannot be entirely ruled out. She also has some numbness over her right hand with weakness and her wrist drop. The symptoms could be related to her humerus fracture. However I will request a neurology consult to rule out for a possible underlying stroke. Will order for an MRI and MRA of the brain. -Orthopedic consult has been called from the ED and will follow up with recommendation. -Pain controlled with as needed Tylenol and Flexeril. Avoid  narcotics. Continue with the sling. -PT consult  UTI As evident on urinalysis. Will treat with IV ceftriaxone. Follow urine culture  Active Problems:  Hypothyroidism Continue synthroid   Obesity  continue heart healthy diet   Anxiety    Hypertension Continue lasix   Coronary artery disease Continue ASA and statin   COPD continue albuterol inhaler. counseled on smoking cessation   Seizure disorder  continue keppra Pending neurology consult     Code Status: full code Family Communication: discussed with family at bedside Disposition Plan: currently inpatient  Eddie North [facility] Pager [phone]  If 7PM-7AM, please contact night-coverage [email or web address] Password [email or web address] 04/23/2012, 1:12 PM   Total time spent on admission:  70 minutes

## 2012-04-24 DIAGNOSIS — R5381 Other malaise: Secondary | ICD-10-CM | POA: Diagnosis present

## 2012-04-24 DIAGNOSIS — S42209A Unspecified fracture of upper end of unspecified humerus, initial encounter for closed fracture: Secondary | ICD-10-CM

## 2012-04-24 DIAGNOSIS — N390 Urinary tract infection, site not specified: Secondary | ICD-10-CM

## 2012-04-24 LAB — URINE CULTURE: Colony Count: 80000

## 2012-04-24 LAB — BASIC METABOLIC PANEL
Calcium: 9.3 mg/dL (ref 8.4–10.5)
GFR calc Af Amer: 68 mL/min — ABNORMAL LOW (ref >90)
GFR calc non Af Amer: 59 mL/min — ABNORMAL LOW (ref >90)
Sodium: 138 mEq/L (ref 135–145)

## 2012-04-24 MED ORDER — CEFUROXIME AXETIL 250 MG PO TABS
500.0000 mg | ORAL_TABLET | Freq: Two times a day (BID) | ORAL | Status: DC
  Administered 2012-04-24 – 2012-04-29 (×10): 500 mg via ORAL
  Filled 2012-04-24 (×10): qty 2

## 2012-04-24 MED ORDER — BACITRACIN-NEOMYCIN-POLYMYXIN OINTMENT TUBE
TOPICAL_OINTMENT | Freq: Two times a day (BID) | CUTANEOUS | Status: DC
  Administered 2012-04-24 – 2012-04-29 (×11): via TOPICAL
  Filled 2012-04-24: qty 15

## 2012-04-24 MED ORDER — DOCUSATE SODIUM 100 MG PO CAPS
100.0000 mg | ORAL_CAPSULE | Freq: Two times a day (BID) | ORAL | Status: DC
  Administered 2012-04-24 – 2012-04-29 (×8): 100 mg via ORAL
  Filled 2012-04-24 (×11): qty 1

## 2012-04-24 MED ORDER — HYDROCODONE-ACETAMINOPHEN 5-325 MG PO TABS
0.5000 | ORAL_TABLET | ORAL | Status: DC | PRN
  Administered 2012-04-24 – 2012-04-27 (×9): 1 via ORAL
  Administered 2012-04-28: 0.5 via ORAL
  Administered 2012-04-29: 1 via ORAL
  Filled 2012-04-24 (×11): qty 1

## 2012-04-24 MED ORDER — MAGNESIUM HYDROXIDE 400 MG/5ML PO SUSP
30.0000 mL | Freq: Every day | ORAL | Status: DC | PRN
  Administered 2012-04-25: 30 mL via ORAL
  Filled 2012-04-24: qty 30

## 2012-04-24 MED ORDER — HYDROCODONE-ACETAMINOPHEN 5-325 MG PO TABS
0.5000 | ORAL_TABLET | Freq: Once | ORAL | Status: AC
  Administered 2012-04-24: 0.5 via ORAL
  Filled 2012-04-24: qty 1

## 2012-04-24 MED ORDER — INFLUENZA VIRUS VACC SPLIT PF IM SUSP
0.5000 mL | INTRAMUSCULAR | Status: AC
  Administered 2012-04-25: 0.5 mL via INTRAMUSCULAR
  Filled 2012-04-24: qty 0.5

## 2012-04-24 NOTE — Progress Notes (Signed)
Pt c/o UE pain.  MD is planning to change pain meds.  Unable to work with pt today.  Will try again tomorrow.

## 2012-04-24 NOTE — Progress Notes (Signed)
UR Chart Review Completed  

## 2012-04-24 NOTE — Clinical Social Work Placement (Signed)
Clinical Social Work Department CLINICAL SOCIAL WORK PLACEMENT NOTE 04/24/2012  Patient:  [name], [name]  Account Number:  [id number] Admit date:  04/23/2012  Clinical Social Worker:  Derenda Fennel, LCSW  Date/time:  04/24/2012 02:40 PM  Clinical Social Work is seeking post-discharge placement for this patient at the following level of care:   SKILLED NURSING   (*CSW will update this form in Epic as items are completed)   04/24/2012  Patient/family provided with [facility] Department of Clinical Social Work's list of facilities offering this level of care within the geographic area requested by the patient (or if unable, by the patient's family).  04/24/2012  Patient/family informed of their freedom to choose among providers that offer the needed level of care, that participate in Medicare, Medicaid or managed care program needed by the patient, have an available bed and are willing to accept the patient.  04/24/2012  Patient/family informed of [facility]' ownership interest in [facility], as well as of the fact that they are under no obligation to receive care at this facility.  PASARR submitted to [employer] on  PASARR number received from [employer] on   FL2 transmitted to all facilities in geographic area requested by pt/family on  04/24/2012 FL2 transmitted to all facilities within larger geographic area on   Patient informed that his/her managed care company has contracts with or will negotiate with  certain facilities, including the following:     Patient/family informed of bed offers received:   Patient chooses bed at  Physician recommends and patient chooses bed at    Patient to be transferred to  on   Patient to be transferred to facility by   The following physician request were entered in Epic:   Additional Comments: Previous pasarr number  Derenda Fennel, LCSW [phone]

## 2012-04-24 NOTE — Consult Note (Signed)
Reason for Consult: Referring Physician:   LADAJA YUSUPOV is an 78 y.o. female.  HPI:   Past Medical History  Diagnosis Date  . Myocardial infarction     history of  . Coronary artery disease   . Leg edema     left  . Aneurysm     hx of  . COPD (chronic obstructive pulmonary disease)   . Hypertension   . Pulmonary nodule   . Obesity   . Anxiety   . Depression   . Hemoptysis   . Pressure ulcer of back     lower  . Tobacco abuse   . DJD (degenerative joint disease)   . Panic disorder   . Urinary incontinence   . Seizure disorder   . Ganglion cyst     left wrist  . Hypothyroidism   . Leg edema   . DDD (degenerative disc disease), lumbosacral     Past Surgical History  Procedure Date  . Appendectomy   . Cholecystectomy   . Replacement total knee 11/2000    right   . Angioplasty     times 2    History reviewed. No pertinent family history.  Social History:  reports that she has been smoking.  She has never used smokeless tobacco. She reports that she does not drink alcohol or use illicit drugs.  Allergies: No Known Allergies  Medications:  Prior to Admission medications   Medication Sig Start Date End Date Taking? Authorizing Provider  ALPRAZolam (XANAX) 0.25 MG tablet Take 1 tablet (0.25 mg total) by mouth at bedtime as needed. 03/07/12  Yes Salley Scarlet, MD  aspirin EC 81 MG tablet Take 81 mg by mouth daily.   Yes Historical Provider, MD  ezetimibe-simvastatin (VYTORIN) 10-80 MG per tablet Take 1 tablet by mouth at bedtime.     Yes Historical Provider, MD  folic acid (FOLVITE) 1 MG tablet Take 1 mg by mouth daily.   Yes Historical Provider, MD  furosemide (LASIX) 40 MG tablet Take 20-40 mg by mouth daily as needed. Swelling   Yes Historical Provider, MD  levETIRAcetam (KEPPRA) 250 MG tablet Take 250 mg by mouth 2 (two) times daily.   Yes Historical Provider, MD  levothyroxine (SYNTHROID, LEVOTHROID) 175 MCG tablet Take 175 mcg by mouth daily.     Yes  Historical Provider, MD  Olopatadine HCl (PATADAY OP) Apply 1 drop to eye daily as needed. Itching   Yes Historical Provider, MD  potassium chloride (K-DUR) 10 MEQ tablet Take 1 tablet (10 mEq total) by mouth daily. 03/07/12  Yes Salley Scarlet, MD  Vitamin D, Ergocalciferol, (DRISDOL) 50000 UNITS CAPS Take 50,000 Units by mouth every 7 (seven) days. Usually on Sundays   Yes Historical Provider, MD    Scheduled Meds:   . sodium chloride   Intravenous Once  . aspirin EC  81 mg Oral Daily  . cefTRIAXone (ROCEPHIN)  IV  1 g Intravenous Once  . cefTRIAXone (ROCEPHIN)  IV  1 g Intravenous Q24H  . enoxaparin (LOVENOX) injection  40 mg Subcutaneous Q24H  . ezetimibe  10 mg Oral QPC supper   And  . simvastatin  80 mg Oral QPC supper  . folic acid  1 mg Oral Daily  . furosemide  20 mg Oral Daily  . levETIRAcetam  250 mg Oral BID  . levothyroxine  175 mcg Oral Daily  . morphine  2 mg Intravenous Once  . olopatadine  1 drop Both Eyes BID  .  pantoprazole  40 mg Oral Q1200  . potassium chloride  10 mEq Oral Daily  . sodium chloride  500 mL Intravenous Once  . Vitamin D (Ergocalciferol)  50,000 Units Oral Q7 days  . DISCONTD: ezetimibe-simvastatin  1 tablet Oral QHS   Continuous Infusions:   . sodium chloride 75 mL/hr at 04/23/12 2045   PRN Meds:.acetaminophen, acetaminophen, ALPRAZolam, cyclobenzaprine, ibuprofen   Results for orders placed during the hospital encounter of 04/23/12 (from the past 48 hour(s))  URINALYSIS, ROUTINE W REFLEX MICROSCOPIC     Status: Abnormal   Collection Time   04/23/12  7:35 AM      Component Value Range Comment   Color, Urine YELLOW  YELLOW    APPearance HAZY (*) CLEAR    Specific Gravity, Urine 1.030  1.005 - 1.030    pH 5.5  5.0 - 8.0    Glucose, UA NEGATIVE  NEGATIVE mg/dL    Hgb urine dipstick LARGE (*) NEGATIVE    Bilirubin Urine NEGATIVE  NEGATIVE    Ketones, ur NEGATIVE  NEGATIVE mg/dL    Protein, ur 30 (*) NEGATIVE mg/dL    Urobilinogen, UA  0.2  0.0 - 1.0 mg/dL    Nitrite NEGATIVE  NEGATIVE    Leukocytes, UA MODERATE (*) NEGATIVE   URINE MICROSCOPIC-ADD ON     Status: Abnormal   Collection Time   04/23/12  7:35 AM      Component Value Range Comment   Squamous Epithelial / LPF RARE  RARE    WBC, UA TOO NUMEROUS TO COUNT  <3 WBC/hpf    RBC / HPF TOO NUMEROUS TO COUNT  <3 RBC/hpf    Bacteria, UA MANY (*) RARE   URINE CULTURE     Status: Normal   Collection Time   04/23/12  7:35 AM      Component Value Range Comment   Specimen Description URINE, CLEAN CATCH      Special Requests NONE      Culture  Setup Time 04/23/2012 15:24      Colony Count 80,000 COLONIES/ML      Culture        Value: Multiple bacterial morphotypes present, none predominant. Suggest appropriate recollection if clinically indicated.   Report Status 04/24/2012 FINAL     CBC WITH DIFFERENTIAL     Status: Abnormal   Collection Time   04/23/12  7:45 AM      Component Value Range Comment   WBC 11.9 (*) 4.0 - 10.5 K/uL    RBC 4.74  3.87 - 5.11 MIL/uL    Hemoglobin 13.9  12.0 - 15.0 g/dL    HCT 41.3  24.4 - 01.0 %    MCV 89.5  78.0 - 100.0 fL    MCH 29.3  26.0 - 34.0 pg    MCHC 32.8  30.0 - 36.0 g/dL    RDW 27.2  53.6 - 64.4 %    Platelets 273  150 - 400 K/uL    Neutrophils Relative 77  43 - 77 %    Neutro Abs 9.2 (*) 1.7 - 7.7 K/uL    Lymphocytes Relative 10 (*) 12 - 46 %    Lymphs Abs 1.2  0.7 - 4.0 K/uL    Monocytes Relative 13 (*) 3 - 12 %    Monocytes Absolute 1.6 (*) 0.1 - 1.0 K/uL    Eosinophils Relative 0  0 - 5 %    Eosinophils Absolute 0.0  0.0 - 0.7 K/uL    Basophils  Relative 0  0 - 1 %    Basophils Absolute 0.0  0.0 - 0.1 K/uL   BASIC METABOLIC PANEL     Status: Abnormal   Collection Time   04/23/12  7:45 AM      Component Value Range Comment   Sodium 142  135 - 145 mEq/L    Potassium 3.4 (*) 3.5 - 5.1 mEq/L    Chloride 103  96 - 112 mEq/L    CO2 27  19 - 32 mEq/L    Glucose, Bld 126 (*) 70 - 99 mg/dL    BUN 17  6 - 23 mg/dL     Creatinine, Ser 1.61  0.50 - 1.10 mg/dL    Calcium 09.6  8.4 - 10.5 mg/dL    GFR calc non Af Amer 63 (*) >90 mL/min    GFR calc Af Amer 72 (*) >90 mL/min   TROPONIN I     Status: Normal   Collection Time   04/23/12  7:45 AM      Component Value Range Comment   Troponin I <0.30  <0.30 ng/mL   BASIC METABOLIC PANEL     Status: Abnormal   Collection Time   04/24/12  4:47 AM      Component Value Range Comment   Sodium 138  135 - 145 mEq/L    Potassium 3.8  3.5 - 5.1 mEq/L    Chloride 106  96 - 112 mEq/L    CO2 23  19 - 32 mEq/L    Glucose, Bld 94  70 - 99 mg/dL    BUN 14  6 - 23 mg/dL    Creatinine, Ser 0.45  0.50 - 1.10 mg/dL    Calcium 9.3  8.4 - 40.9 mg/dL    GFR calc non Af Amer 59 (*) >90 mL/min    GFR calc Af Amer 68 (*) >90 mL/min     Dg Chest 1 View  04/23/2012  *RADIOLOGY REPORT*  Clinical Data: Fall  CHEST - 1 VIEW  Comparison: 09/20/2010  Findings: Heart is borderline enlarged.  Vascular congestion without interstitial edema.  Aortic knob is distinct.  No pneumothorax.  No consolidation.  Persistent elevation of the left hemidiaphragm.  No obvious acute bony deformity.  IMPRESSION: Borderline cardiomegaly without edema.   Original Report Authenticated By: Donavan Burnet, M.D.    Dg Shoulder Right  04/23/2012  *RADIOLOGY REPORT*  Clinical Data: Fall  RIGHT SHOULDER - 2+ VIEW  Comparison: None.  Findings: There is lucency extending across the humeral head. Nondisplaced fracture is not excluded.  Normal obvious cortical step off.  There is apparent impaction of the humeral neck into the humeral head associated with a sclerotic band which may also represent a fracture  IMPRESSION: Above findings are worrisome for a nondisplaced and impaction fracture of the humeral neck and head.   Original Report Authenticated By: Donavan Burnet, M.D.    Dg Forearm Right  04/23/2012  *RADIOLOGY REPORT*  Clinical Data: Fall, pain.  RIGHT FOREARM - 2 VIEW  Comparison: None.  Findings: Degenerative  changes in the right wrist and right elbow. No acute bony abnormality.  No fracture, subluxation or dislocation.  Soft tissues are intact.  IMPRESSION: No acute bony abnormality.   Original Report Authenticated By: Cyndie Chime, M.D.    Ct Head Wo Contrast  04/23/2012  *RADIOLOGY REPORT*  Clinical Data:  Fall.  Abrasions to forehead.  CT HEAD WITHOUT CONTRAST CT CERVICAL SPINE WITHOUT CONTRAST  Technique:  Multidetector CT imaging of the head and cervical spine was performed following the standard protocol without intravenous contrast.  Multiplanar CT image reconstructions of the cervical spine were also generated.  Comparison:  None.  CT HEAD  Findings: There is atrophy and chronic small vessel disease changes.  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  Old right thalamic lacunar infarct, stable. No acute intracranial abnormality.  Specifically, no hemorrhage, hydrocephalus, mass lesion, acute infarction, or significant intracranial injury.  No acute calvarial abnormality. Visualized paranasal sinuses and mastoids clear.  Orbital soft tissues unremarkable.  IMPRESSION: No acute intracranial abnormality.  CT CERVICAL SPINE  Findings: Diffuse degenerative disc disease and facet disease. Normal alignment.  Prevertebral soft tissues are normal.  No fracture, epidural or paraspinal hematoma.  IMPRESSION: Diffuse degenerative changes.  No acute bony abnormality.   Original Report Authenticated By: Cyndie Chime, M.D.    Ct Cervical Spine Wo Contrast  04/23/2012  *RADIOLOGY REPORT*  Clinical Data:  Fall.  Abrasions to forehead.  CT HEAD WITHOUT CONTRAST CT CERVICAL SPINE WITHOUT CONTRAST  Technique:  Multidetector CT imaging of the head and cervical spine was performed following the standard protocol without intravenous contrast.  Multiplanar CT image reconstructions of the cervical spine were also generated.  Comparison:  None.  CT HEAD  Findings: There is atrophy and chronic small vessel disease changes.   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  Old right thalamic lacunar infarct, stable. No acute intracranial abnormality.  Specifically, no hemorrhage, hydrocephalus, mass lesion, acute infarction, or significant intracranial injury.  No acute calvarial abnormality. Visualized paranasal sinuses and mastoids clear.  Orbital soft tissues unremarkable.  IMPRESSION: No acute intracranial abnormality.  CT CERVICAL SPINE  Findings: Diffuse degenerative disc disease and facet disease. Normal alignment.  Prevertebral soft tissues are normal.  No fracture, epidural or paraspinal hematoma.  IMPRESSION: Diffuse degenerative changes.  No acute bony abnormality.   Original Report Authenticated By: Cyndie Chime, M.D.    Dg Shoulder Left  04/23/2012  *RADIOLOGY REPORT*  Clinical Data: Fall  LEFT SHOULDER - 2+ VIEW  Comparison: 12/13/2004  Findings: Minimally displaced fracture involving the articular surface of the lateral humeral head is present.  This likely represents a greater tuberosity fragment.  Degenerative changes are superimposed with prominent inferior AC joint osteophytes. Prominent degenerative change of the glenohumeral joint.  IMPRESSION: Acute minimally displaced fracture of the humeral head.  This likely represents the greater tuberosity.   Original Report Authenticated By: Donavan Burnet, M.D.    Dg Knee Complete 4 Views Right  04/23/2012  *RADIOLOGY REPORT*  Clinical Data: Fall  RIGHT KNEE - COMPLETE 4+ VIEW  Comparison: None.  Findings: Total knee are there is no breakage or loosening of the hardware.  No acute fracture.  No dislocation.  IMPRESSION: Total knee arthroplasty anatomically aligned.  No acute bony pathology.   Original Report Authenticated By: Donavan Burnet, M.D.    Dg Humerus Left  04/23/2012  *RADIOLOGY REPORT*  Clinical Data: Fall, chest pain, shoulder pain.  LEFT HUMERUS - 2+ VIEW  Comparison: None.  Findings: Advanced degenerative changes in the left shoulder. No acute bony  abnormality.  Specifically, no fracture, subluxation, or dislocation.  Soft tissues are intact.  IMPRESSION: No acute bony abnormality.   Original Report Authenticated By: Cyndie Chime, M.D.     Review of Systems  Constitutional: Negative.   HENT: Positive for neck pain.   Respiratory: Negative.   Cardiovascular: Negative.   Gastrointestinal: Negative.   Genitourinary:  Negative.   Musculoskeletal: Positive for myalgias, joint pain and falls.  Skin: Negative.   Neurological: Positive for sensory change and headaches.   Blood pressure 160/84, pulse 83, temperature 98.4 F (36.9 C), temperature source Oral, resp. rate 18, height 5\' 8"  (1.727 m), weight 94.7 kg (208 lb 12.4 oz), SpO2 96.00%. Physical Exam  Assessment/Plan: Likely un-witnessed seizure. Also see dictation and orders.   Anna Livers 04/24/2012, 8:51 AM

## 2012-04-24 NOTE — Consult Note (Signed)
[facility] Anastacia Reinecke A. Gerilyn Pilgrim, MD     [email or web address]        SUBJECTIVE: The patient is a 78 year old black female who is known to my service from the outpatient setting. The patient lives independently by herself although she has significant chronic medical problems. She is to be highly functional but does have difficulty ambulating. She found herself on the ground while sleeping last night. She has no idea how she got on the floor. She was not able to move and complaint of weakness of the hands bilaterally associated with significant pain and swelling especially of the right upper extremity. She reports having numbness of the right hand and right forearm. The patient does not recall falling out of bed or having a seizure. She has a baseline history of epilepsy which has been very well controlled. Her last seizure was April 2012. She previously was on Dilantin but this did not work for her. It appears that the patient got her Keppra which she is taking filled out a week ago but misplaced the medication as he was filled. She reports that she has not taken the medication over a week. She denies any oral trauma or urine incontinence. The patient was seen a few weeks ago for headaches. She again has gait impairment due to arthritis.  OBJECTIVE: GENERAL: The patient is in obvious discomfort from her pain.  HEENT: Neck is supple head is normocephalic atraumatic.  CHEST: She has a marked size laceration and bruising involving the left chest region/left breast area.   ABDOMEN: soft  EXTREMITIES: There is swelling of the right hand. She actually has a wrist drop of the right hand. She has significant reduced range of motion of the hands and shoulders bilaterally especially on the right side. Mild bruising is noted of the right knee and left knee along with the left upper extremity. Again, there is marked swelling of the right hand which per the patient has actually gotten better.  BACK:  Unremarkable.  SKIN: Normal by inspection.    MENTAL STATUS: Alert and oriented. Speech, language and cognition are generally intact. Judgment and insight normal.   CRANIAL NERVES: Pupils are equal, round and reactive to light and accomodation; extra ocular movements are full, there is no significant nystagmus; visual fields are full; upper and lower facial muscles are normal in strength and symmetric, there is no flattening of the nasolabial folds; tongue is midline.  MOTOR: Normal tone, bulk and strength; no pronator drift.  COORDINATION: Left finger to nose is normal, right finger to nose is normal, No rest tremor; no intention tremor; no postural tremor; no bradykinesia.  SENSATION: Normal to light pain.  Head CT scan is unremarkable. Cervical spine CT scan shows degenerative changes but otherwise no acute fractures.  ASSESSMENT/PLAN:  1. I suspect that the patient most likely had a unwitnessed seizure since she has been off her Keppra. Another possibility could be that she fell out of the bed but I see no obvious head injuries. The patient has been restarted back on her Keppra. I see no reason to escalate the dose of her Keppra as this does not represent a breakthrough seizure. She has a few bruises which will be treated with Neosporin.  Santiana Glidden A. Gerilyn Pilgrim, M.D. Diplomate, Education officer, community and Neurology ( Neurology).

## 2012-04-24 NOTE — Progress Notes (Signed)
OT Cancellation Note  Occupational Therapy Evaluation cancelled today due to patient's level of consciousness. Patient too lerthargic to participate in OT eval this date.  Will re-attempt OT eval when appropriate.  Limmie Patricia, OTR/L 04/24/2012, 2:23 PM

## 2012-04-24 NOTE — Progress Notes (Signed)
Chart reviewed.  Subjective: Complaining of pain. Objective: Vital signs in last 24 hours: Filed Vitals:   04/23/12 1100 04/23/12 1330 04/23/12 2215 04/24/12 0506  BP: 188/92 167/95 144/81 160/84  Pulse:  82 82 83  Temp:  98.1 F (36.7 C) 98.3 F (36.8 C) 98.4 F (36.9 C)  TempSrc:  Oral Oral Oral  Resp: 24 18 18 18   Height:  5\' 8"  (1.727 m)    Weight:  94.7 kg (208 lb 12.4 oz)    SpO2:  98% 97% 96%   Weight change:   Intake/Output Summary (Last 24 hours) at 04/24/12 1304 Last data filed at 04/24/12 1249  Gross per 24 hour  Intake 2254.17 ml  Output    100 ml  Net 2154.17 ml   General: Asleep. Arousable. Occasionally winces in pain. Lungs clear to auscultation bilaterally without wheeze rhonchi or rales Cardiovascular regular rate rhythm without murmurs gallops rubs Abdomen obese soft nontender Extremities both arms in braces. Right hand with some edema. Legs without edema.  Lab Results: Basic Metabolic Panel:  Lab 04/24/12 1610 04/23/12 0745  NA 138 142  K 3.8 3.4*  CL 106 103  CO2 23 27  GLUCOSE 94 126*  BUN 14 17  CREATININE 0.84 0.80  CALCIUM 9.3 10.1  MG -- --  PHOS -- --   Liver Function Tests: No results found for this basename: AST:2,ALT:2,ALKPHOS:2,BILITOT:2,PROT:2,ALBUMIN:2 in the last 168 hours No results found for this basename: LIPASE:2,AMYLASE:2 in the last 168 hours No results found for this basename: AMMONIA:2 in the last 168 hours CBC:  Lab 04/23/12 0745  WBC 11.9*  NEUTROABS 9.2*  HGB 13.9  HCT 42.4  MCV 89.5  PLT 273   Cardiac Enzymes:  Lab 04/23/12 0745  CKTOTAL --  CKMB --  CKMBINDEX --  TROPONINI <0.30   BNP: No results found for this basename: PROBNP:3 in the last 168 hours D-Dimer: No results found for this basename: DDIMER:2 in the last 168 hours CBG: No results found for this basename: GLUCAP:6 in the last 168 hours Hemoglobin A1C: No results found for this basename: HGBA1C in the last 168 hours Fasting Lipid  Panel: No results found for this basename: CHOL,HDL,LDLCALC,TRIG,CHOLHDL,LDLDIRECT in the last 960 hours Thyroid Function Tests: No results found for this basename: TSH,T4TOTAL,FREET4,T3FREE,THYROIDAB in the last 168 hours Coagulation: No results found for this basename: LABPROT:4,INR:4 in the last 168 hours Anemia Panel: No results found for this basename: VITAMINB12,FOLATE,FERRITIN,TIBC,IRON,RETICCTPCT in the last 168 hours Urine Drug Screen: Drugs of Abuse  No results found for this basename: labopia, cocainscrnur, labbenz, amphetmu, thcu, labbarb    Alcohol Level: No results found for this basename: ETH:2 in the last 168 hours Urinalysis:  Lab 04/23/12 0735  COLORURINE YELLOW  LABSPEC 1.030  PHURINE 5.5  GLUCOSEU NEGATIVE  HGBUR LARGE*  BILIRUBINUR NEGATIVE  KETONESUR NEGATIVE  PROTEINUR 30*  UROBILINOGEN 0.2  NITRITE NEGATIVE  LEUKOCYTESUR MODERATE*   Micro Results: Recent Results (from the past 240 hour(s))  URINE CULTURE     Status: Normal   Collection Time   04/23/12  7:35 AM      Component Value Range Status Comment   Specimen Description URINE, CLEAN CATCH   Final    Special Requests NONE   Final    Culture  Setup Time 04/23/2012 15:24   Final    Colony Count 80,000 COLONIES/ML   Final    Culture     Final    Value: Multiple bacterial morphotypes present, none predominant. Suggest appropriate  recollection if clinically indicated.   Report Status 04/24/2012 FINAL   Final    Studies/Results: Dg Chest 1 View  04/23/2012  *RADIOLOGY REPORT*  Clinical Data: Fall  CHEST - 1 VIEW  Comparison: 09/20/2010  Findings: Heart is borderline enlarged.  Vascular congestion without interstitial edema.  Aortic knob is distinct.  No pneumothorax.  No consolidation.  Persistent elevation of the left hemidiaphragm.  No obvious acute bony deformity.  IMPRESSION: Borderline cardiomegaly without edema.   Original Report Authenticated By: Donavan Burnet, M.D.    Dg Shoulder  Right  04/23/2012  *RADIOLOGY REPORT*  Clinical Data: Fall  RIGHT SHOULDER - 2+ VIEW  Comparison: None.  Findings: There is lucency extending across the humeral head. Nondisplaced fracture is not excluded.  Normal obvious cortical step off.  There is apparent impaction of the humeral neck into the humeral head associated with a sclerotic band which may also represent a fracture  IMPRESSION: Above findings are worrisome for a nondisplaced and impaction fracture of the humeral neck and head.   Original Report Authenticated By: Donavan Burnet, M.D.    Dg Forearm Right  04/23/2012  *RADIOLOGY REPORT*  Clinical Data: Fall, pain.  RIGHT FOREARM - 2 VIEW  Comparison: None.  Findings: Degenerative changes in the right wrist and right elbow. No acute bony abnormality.  No fracture, subluxation or dislocation.  Soft tissues are intact.  IMPRESSION: No acute bony abnormality.   Original Report Authenticated By: Cyndie Chime, M.D.    Ct Head Wo Contrast  04/23/2012  *RADIOLOGY REPORT*  Clinical Data:  Fall.  Abrasions to forehead.  CT HEAD WITHOUT CONTRAST CT CERVICAL SPINE WITHOUT CONTRAST  Technique:  Multidetector CT imaging of the head and cervical spine was performed following the standard protocol without intravenous contrast.  Multiplanar CT image reconstructions of the cervical spine were also generated.  Comparison:  None.  CT HEAD  Findings: There is atrophy and chronic small vessel disease changes.  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  Old right thalamic lacunar infarct, stable. No acute intracranial abnormality.  Specifically, no hemorrhage, hydrocephalus, mass lesion, acute infarction, or significant intracranial injury.  No acute calvarial abnormality. Visualized paranasal sinuses and mastoids clear.  Orbital soft tissues unremarkable.  IMPRESSION: No acute intracranial abnormality.  CT CERVICAL SPINE  Findings: Diffuse degenerative disc disease and facet disease. Normal alignment.  Prevertebral  soft tissues are normal.  No fracture, epidural or paraspinal hematoma.  IMPRESSION: Diffuse degenerative changes.  No acute bony abnormality.   Original Report Authenticated By: Cyndie Chime, M.D.    Ct Cervical Spine Wo Contrast  04/23/2012  *RADIOLOGY REPORT*  Clinical Data:  Fall.  Abrasions to forehead.  CT HEAD WITHOUT CONTRAST CT CERVICAL SPINE WITHOUT CONTRAST  Technique:  Multidetector CT imaging of the head and cervical spine was performed following the standard protocol without intravenous contrast.  Multiplanar CT image reconstructions of the cervical spine were also generated.  Comparison:  None.  CT HEAD  Findings: There is atrophy and chronic small vessel disease changes.  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  Old right thalamic lacunar infarct, stable. No acute intracranial abnormality.  Specifically, no hemorrhage, hydrocephalus, mass lesion, acute infarction, or significant intracranial injury.  No acute calvarial abnormality. Visualized paranasal sinuses and mastoids clear.  Orbital soft tissues unremarkable.  IMPRESSION: No acute intracranial abnormality.  CT CERVICAL SPINE  Findings: Diffuse degenerative disc disease and facet disease. Normal alignment.  Prevertebral soft tissues are normal.  No fracture, epidural  or paraspinal hematoma.  IMPRESSION: Diffuse degenerative changes.  No acute bony abnormality.   Original Report Authenticated By: Cyndie Chime, M.D.    Dg Shoulder Left  04/23/2012  *RADIOLOGY REPORT*  Clinical Data: Fall  LEFT SHOULDER - 2+ VIEW  Comparison: 12/13/2004  Findings: Minimally displaced fracture involving the articular surface of the lateral humeral head is present.  This likely represents a greater tuberosity fragment.  Degenerative changes are superimposed with prominent inferior AC joint osteophytes. Prominent degenerative change of the glenohumeral joint.  IMPRESSION: Acute minimally displaced fracture of the humeral head.  This likely represents the  greater tuberosity.   Original Report Authenticated By: Donavan Burnet, M.D.    Dg Knee Complete 4 Views Right  04/23/2012  *RADIOLOGY REPORT*  Clinical Data: Fall  RIGHT KNEE - COMPLETE 4+ VIEW  Comparison: None.  Findings: Total knee are there is no breakage or loosening of the hardware.  No acute fracture.  No dislocation.  IMPRESSION: Total knee arthroplasty anatomically aligned.  No acute bony pathology.   Original Report Authenticated By: Donavan Burnet, M.D.    Dg Humerus Left  04/23/2012  *RADIOLOGY REPORT*  Clinical Data: Fall, chest pain, shoulder pain.  LEFT HUMERUS - 2+ VIEW  Comparison: None.  Findings: Advanced degenerative changes in the left shoulder. No acute bony abnormality.  Specifically, no fracture, subluxation, or dislocation.  Soft tissues are intact.  IMPRESSION: No acute bony abnormality.   Original Report Authenticated By: Cyndie Chime, M.D.    Scheduled Meds:   . aspirin EC  81 mg Oral Daily  . cefTRIAXone (ROCEPHIN)  IV  1 g Intravenous Q24H  . enoxaparin (LOVENOX) injection  40 mg Subcutaneous Q24H  . ezetimibe  10 mg Oral QPC supper   And  . simvastatin  80 mg Oral QPC supper  . folic acid  1 mg Oral Daily  . furosemide  20 mg Oral Daily  . HYDROcodone-acetaminophen  0.5 tablet Oral Once  . levETIRAcetam  250 mg Oral BID  . levothyroxine  175 mcg Oral Daily  . neomycin-bacitracin-polymyxin   Topical BID  . olopatadine  1 drop Both Eyes BID  . pantoprazole  40 mg Oral Q1200  . potassium chloride  10 mEq Oral Daily  . Vitamin D (Ergocalciferol)  50,000 Units Oral Q7 days  . DISCONTD: ezetimibe-simvastatin  1 tablet Oral QHS   Continuous Infusions:   . sodium chloride 75 mL/hr at 04/23/12 2045   PRN Meds:.acetaminophen, acetaminophen, ALPRAZolam, cyclobenzaprine, HYDROcodone-acetaminophen, ibuprofen Assessment/Plan: Active Problems:  SEIZURE DISORDER: Had been off her Keppra. She reportedly misplaced her medication for a week. Likely had a seizure  which led to her falling off the bed and sustaining fractures  Closed fracture of left proximal humerus  Closed fracture of right proximal humerus  HYPERTENSION  UTI (urinary tract infection): Change to oral and a biotic  HYPOTHYROIDISM  OBESITY  ANXIETY  TOBACCO ABUSE  CORONARY ARTERY DISEASE  COPD  Debility: Had previously been living alone and walking with a walker. I had an extensive discussion with family, they had many questions. I answered them. We went over the films. Patient will need skilled nursing facility placement. She is unable to care for herself in this condition. She has been started back on Keppra.    LOS: 1 day   Olesya Wike L 04/24/2012, 1:04 PM

## 2012-04-24 NOTE — Clinical Social Work Psychosocial (Signed)
Clinical Social Work Department BRIEF PSYCHOSOCIAL ASSESSMENT 04/24/2012  Patient:  [name], [name]     Account Number:  [id number]     Admit date:  04/23/2012  Clinical Social Worker:  Nancie Neas  Date/Time:  04/24/2012 03:15 PM  Referred by:  Physician  Date Referred:  04/24/2012 Referred for  SNF Placement   Other Referral:   Interview type:  Patient Other interview type:   daughter Earleen    PSYCHOSOCIAL DATA Living Status:  ALONE Admitted from facility:   Level of care:   Primary support name:  Earleen Primary support relationship to patient:  CHILD, ADULT Degree of support available:   supportive per pt    CURRENT CONCERNS Current Concerns  Post-Acute Placement   Other Concerns:    SOCIAL WORK ASSESSMENT / PLAN CSW met with pt and daughter at bedside. Pt alert and oriented for part of assessment but then fell asleep. She reports she lives alone and was independent in care and also did all the cooking and cleaning for herself. She states it appears she had a seizure yesterday while sleeping and woke up on the floor. Pt has bilateral humerus fractures. Pt too lethargic to work with PT today but it is apparent she will require rehab at d/c. Pt has been to [facility] in past. They request return there or [place]. SNF list provided. Family aware of copays and may contact DSS to apply for Medicaid. They will notify CSW if this is completed. Family involved and supportive and live nearby.   Assessment/plan status:  Psychosocial Support/Ongoing Assessment of Needs Other assessment/ plan:   Information/referral to community resources:   SNF list  DSS    PATIENT'S/FAMILY'S RESPONSE TO PLAN OF CARE: Pt and family feel SNF will be necessary at d/c prior to hopefully returning home.  Family is aware of [facility] authorization requirement for SNF. Awaiting PT eval. CSW faxed out FL2 and will follow up with bed offers when available.        Derenda Fennel, [place] [phone]

## 2012-04-25 DIAGNOSIS — G5630 Lesion of radial nerve, unspecified upper limb: Secondary | ICD-10-CM | POA: Diagnosis present

## 2012-04-25 MED ORDER — MAGNESIUM HYDROXIDE 400 MG/5ML PO SUSP
30.0000 mL | Freq: Every day | ORAL | Status: AC
  Administered 2012-04-26: 30 mL via ORAL
  Filled 2012-04-25 (×3): qty 30

## 2012-04-25 NOTE — Evaluation (Signed)
Physical Therapy Evaluation Patient Details Name: [name] MRN: [id number] DOB: 14-Mar-1919 Today's Date: 04/25/2012 Time: 9629-5284 PT Time Calculation (min): 58 min  PT Assessment / Plan / Recommendation Clinical Impression  Pt is seen for initial eval.  She is alert and oriented, very cooperative and well motivated.  She was found to be deconditioned from recent trauma and has decreased strength in both LEs.  Currently, both UEs are in slings and she is unable to weight bear on assistive device.  At home, she ambulates wiith a walker or "furniture walks".  She is s/p bilateral TKR.  I had initially expected the pt. to be total assist for all mobility.  I was mistaken.  She demonstrated the ability to assist with transfer to EOB and then was able to transfer sit to stand with just mod assist.  (After spending 2 days in bed).  She was able to transfer bed to chair with min to mod assis and was limited more by fear of falling than by physical limitation.  She has the potential to ambulate with one to two person assist and this will be critically important to develop at this point. If she stays bed or chair bound until her fractures heal, her ability to ambulate will be questionable.  She also will need immediate OT intervention to work on gentle exercise to both elbows, wrists and hands so as to prevent contracture and help improve strength of R wrist (she is R dominant).  This pt is surprisingly cooperative and well motivated for her age and recent trauma and would be an excellent candidate for SNF.              PT Assessment  Patient needs continued PT services    Follow Up Recommendations  Skilled nursing facility    Barriers to Discharge Decreased caregiver support pt lives alone    Equipment Recommendations  Defer to next venue    Recommendations for Other Services OT consult   Frequency Min 3X/week    Precautions / Restrictions Precautions Precautions: Fall Required Braces or  Orthoses: Other Brace/Splint Other Brace/Splint: bilateral UE slings Restrictions Weight Bearing Restrictions: Yes RUE Weight Bearing: Non weight bearing LUE Weight Bearing: Non weight bearing   Pertinent Vitals/Pain       Mobility  Bed Mobility Bed Mobility: Supine to Sit;Sitting - Scoot to Edge of Bed;Sit to Supine Supine to Sit: 2: Max assist;HOB elevated Sitting - Scoot to [place] of Bed: 2: Max assist Sit to Supine: Not Tested (comment) Details for Bed Mobility Assistance: pt has difficulty moving her hips without the use of her arms, but she tries diligently...once she is off of our air mattress, I predict that she will become more able to complete transfer with less assist Transfers Transfers: Sit to Stand;Stand to Sit;Stand Pivot Transfers Sit to Stand: 3: Mod assist;From elevated surface;Without upper extremity assist;From chair/3-in-1;From bed Stand to Sit: 4: Min assist;To bed;To chair/3-in-1 Stand Pivot Transfers: 3: Mod assist;From elevated surface Details for Transfer Assistance: pt was fearful at first, and she must have an elevated surface upon which to rise.  However, with several repetitions of standing, she became more confident.      Ambulation/Gait Ambulation/Gait Assistance: Not tested (comment) (unable just yet)    Exercises General Exercises - Lower Extremity Ankle Circles/Pumps: AROM;Both;10 reps;Supine Quad Sets: AROM;Both;10 reps;Supine Gluteal Sets: AROM;Both;10 reps;Supine Heel Slides: AROM;Both;10 reps;Supine Hip ABduction/ADduction: AROM;Both;10 reps;Supine   PT Diagnosis: Difficulty walking;Generalized weakness  PT Problem List: Decreased strength;Decreased activity  tolerance;Decreased mobility;Obesity;Decreased knowledge of precautions PT Treatment Interventions: Gait training;Functional mobility training;Therapeutic activities;Therapeutic exercise;Patient/family education   PT Goals Acute Rehab PT Goals PT Goal Formulation: With  patient/family Time For Goal Achievement: 05/09/12 Potential to Achieve Goals: Good Pt will go Supine/Side to Sit: with mod assist;with HOB not 0 degrees (comment degree) PT Goal: Supine/Side to Sit - Progress: Goal set today Pt will go Sit to Supine/Side: with mod assist;with HOB not 0 degrees (comment degree) PT Goal: Sit to Supine/Side - Progress: Goal set today Pt will go Sit to Stand: with min assist;without upper extremity assist;from elevated surface PT Goal: Sit to Stand - Progress: Goal set today Pt will go Stand to Sit: with supervision;without upper extremity assist;to elevated surface PT Goal: Stand to Sit - Progress: Goal set today Pt will Transfer Bed to Chair/Chair to Bed: with min assist PT Transfer Goal: Bed to Chair/Chair to Bed - Progress: Goal set today Pt will Ambulate: 1 - 15 feet;with mod assist;Other (comment) (with hand held assist) PT Goal: Ambulate - Progress: Goal set today  Visit Information  Last PT Received On: 04/25/12    Subjective Data  Subjective: I would like to get up and walk to the bathroom Patient Stated Goal: return to independence at home   Prior Functioning  Home Living Lives With: Alone Available Help at Discharge: Family;Available PRN/intermittently Type of Home: House Home Access: Ramped entrance Home Layout: One level Bathroom Accessibility: Yes How Accessible: Accessible via walker Home Adaptive Equipment: Walker - rolling;Bedside commode/3-in-1 Prior Function Level of Independence: Independent with assistive device(s) Able to Take Stairs?: No Driving: No Vocation: Retired Musician: No difficulties    Cognition  Overall Cognitive Status: Appears within functional limits for tasks assessed/performed Arousal/Alertness: Awake/alert Orientation Level: Appears intact for tasks assessed Behavior During Session: [facility] for tasks performed    Extremity/Trunk Assessment Right Upper Extremity Assessment RUE  ROM/Strength/Tone: Deficits RUE ROM/Strength/Tone Deficits: No ROM to be done at shoulder..Pt will need to begin gently AA to A ROM to elbow, wrist and hand...she has a drop wrist with 2-/5 strength at wrist extensors Left Upper Extremity Assessment LUE ROM/Strength/Tone: Deficits LUE ROM/Strength/Tone Deficits: no ROM to shoulder...will need AA to A ROM to elbow, wrist and hand  Right Lower Extremity Assessment RLE ROM/Strength/Tone: Deficits RLE ROM/Strength/Tone Deficits: strength of hip is 3/5, knee is 4-/5 RLE Sensation: WFL - Light Touch Left Lower Extremity Assessment LLE ROM/Strength/Tone: Deficits LLE ROM/Strength/Tone Deficits: strength of hip is 3/5, knee is 4-/5 LLE Sensation: WFL - Light Touch Trunk Assessment Trunk Assessment: Normal   Balance Balance Balance Assessed: Yes Static Sitting Balance Static Sitting - Balance Support: No upper extremity supported Static Sitting - Level of Assistance: 7: Independent Dynamic Sitting Balance Dynamic Sitting - Balance Support: No upper extremity supported Dynamic Sitting - Level of Assistance: 7: Independent Static Standing Balance Static Standing - Balance Support: No upper extremity supported Static Standing - Level of Assistance: 5: Stand by assistance  End of Session PT - End of Session Equipment Utilized During Treatment: Gait belt Activity Tolerance: Patient tolerated treatment well Patient left: in chair;with call bell/phone within reach;with family/visitor present Nurse Communication: Mobility status  GP     Konrad Penta 04/25/2012, 10:52 AM

## 2012-04-25 NOTE — Progress Notes (Signed)
Patient ID: [name], female   DOB: [birth year], 78 y.o.   MRN: [id number] Bilateral humeral fractures   Seizure  For the humeral fractures : follow up in 2 weeks for xrays   Wear slings x 2 weeks   At 2 weeks start OT/PT for the shoulders for PROM

## 2012-04-25 NOTE — Evaluation (Signed)
Occupational Therapy Evaluation Patient Details Name: [name] MRN: [id number] DOB: September 13, 1918 Today's Date: 04/25/2012 Time: 4098-1191 OT Time Calculation (min): 35 min OT Eval 1445-1510 Splinting 1510-1520  OT Assessment / Plan / Recommendation Clinical Impression  78 year old female who has fractured bilateral proximal humerus after falling out of bed. Patient also presents with radial nerve palsy type symptoms in her right wrist. Patient with decreased I with all BADLs due to inabiity to use BUE.   Per. Dr. Lendell Caprice order, obtained and applied right wrist brace to maintain extension in right wrist.    OT Assessment  Patient needs continued OT Services    Follow Up Recommendations  Skilled nursing facility    Barriers to Discharge      Equipment Recommendations  Defer to next venue    Recommendations for Other Services    Frequency  Min 2X/week    Precautions / Restrictions Precautions Precautions: Shoulder Type of Shoulder Precautions: Bilateral proximal humerus fractures, Slings x 2 weeks.  AROM of elbow - hand is ok.   Required Braces or Orthoses: Other Brace/Splint Other Brace/Splint: bilateral UE slings and right wrist brace.   Restrictions Weight Bearing Restrictions: Yes RUE Weight Bearing: Non weight bearing LUE Weight Bearing: Non weight bearing       ADL  Eating/Feeding: Simulated;Min guard (would be able to do finger food after setup) Grooming: +1 Total assistance;Simulated ADL Comments: RUE dominant.  Simulated self feeding wtih right and left hands within confines of the slings.  All other ADLs will be dependent due to bilateral proximal humerus fractures, with no AROM allowed  with either shoulder.      OT Diagnosis: Generalized weakness;Acute pain  OT Problem List: Decreased range of motion;Decreased coordination;Decreased safety awareness;Impaired tone;Decreased knowledge of precautions;Pain;Impaired UE functional use OT Treatment Interventions:  Self-care/ADL training;Therapeutic exercise;Therapeutic activities;Splinting;Manual therapy;Patient/family education   OT Goals Acute Rehab OT Goals OT Goal Formulation: With patient Time For Goal Achievement: 05/09/12 Potential to Achieve Goals: Good ADL Goals Pt Will Perform Eating: with set-up (finger foods only) ADL Goal: Eating - Progress: Goal set today Arm Goals Pt Will Perform AROM: with minimal assist;Bilateral upper extremities;to maintain range of motion Arm Goal: AROM - Progress: Goal set today Miscellaneous OT Goals Miscellaneous OT Goal #1: Patient will complete functional transfers with min-mod assist. OT Goal: Miscellaneous Goal #1 - Progress: Goal set today Miscellaneous OT Goal #2: Patient will be able to instruct caregiver on donning/doffing right wrist brace.    Visit Information  Last OT Received On: 04/25/12    Subjective Data  Subjective: S:  I live alone, I will be ok Patient Stated Goal: Get use of right arm back   Prior Functioning  Vision/Perception  Home Living Lives With: Alone Available Help at Discharge: Family;Available PRN/intermittently Type of Home: House Home Access: Ramped entrance Home Layout: One level Bathroom Accessibility: Yes How Accessible: Accessible via walker Home Adaptive Equipment: Walker - rolling;Bedside commode/3-in-1 Prior Function Level of Independence: Independent with assistive device(s) Able to Take Stairs?: No Driving: No Vocation: Retired Musician: No difficulties Dominant Hand: Right      Cognition  Overall Cognitive Status: Appears within functional limits for tasks assessed/performed Arousal/Alertness: Awake/alert Orientation Level: Appears intact for tasks assessed Behavior During Session: [facility] for tasks performed    Extremity/Trunk Assessment Right Upper Extremity Assessment RUE ROM/Strength/Tone: Deficits RUE ROM/Strength/Tone Deficits: No ROM to shoulder, A/PROM to elbow,  forearm, wrist, hands.  no active wrist extension against gravity.   Left Upper Extremity  Assessment LUE ROM/Strength/Tone: Deficits LUE ROM/Strength/Tone Deficits: no ROM to shoulder, A/PROM to elbow, wrist, hand is [facility].   Mobility  Shoulder Instructions      Donning/doffing sling/immobilizer: Caregiver independent with task Correct positioning of sling/immobilizer: Moderate assistance (Educated patient and daughter, Kennon Rounds, on proper positioning) ROM for elbow, wrist and digits of operated UE: Minimal assistance;Caregiver independent with task Sling wearing schedule (on at all times/off for ADL's): Supervision/safety   Exercise     Balance     End of Session OT - End of Session Activity Tolerance: Patient tolerated treatment well Patient left: in bed  GO     Shirlean Mylar, OTR/L  04/25/2012, 3:50 PM

## 2012-04-25 NOTE — Progress Notes (Signed)
Subjective: Pain better controlled on current regimen. Feels constipated. Has worked with physical therapy and occupational therapy. No other complaints.  Objective: Vital signs in last 24 hours: Filed Vitals:   04/24/12 0506 04/24/12 1345 04/24/12 2100 04/25/12 0450  BP: 160/84 178/97 169/98 152/85  Pulse: 83 77 79 77  Temp: 98.4 F (36.9 C) 98.3 F (36.8 C) 98.3 F (36.8 C) 98.2 F (36.8 C)  TempSrc: Oral Oral Oral Oral  Resp: 18 20 20 20   Height:      Weight:      SpO2: 96% 97% 97% 96%   Weight change:   Intake/Output Summary (Last 24 hours) at 04/25/12 1529 Last data filed at 04/25/12 0400  Gross per 24 hour  Intake    200 ml  Output    700 ml  Net   -500 ml   General: Alert, but falls asleep fairly quickly. More responsive today. More comfortable appearing. Lungs clear to auscultation bilaterally without wheeze rhonchi or rales Cardiovascular regular rate rhythm without murmurs gallops rubs Abdomen obese soft nontender Extremities both arms in braces. Right hand with some edema. Legs without edema.  Lab Results: Basic Metabolic Panel:  Lab 04/24/12 1610 04/23/12 0745  NA 138 142  K 3.8 3.4*  CL 106 103  CO2 23 27  GLUCOSE 94 126*  BUN 14 17  CREATININE 0.84 0.80  CALCIUM 9.3 10.1  MG -- --  PHOS -- --   Liver Function Tests: No results found for this basename: AST:2,ALT:2,ALKPHOS:2,BILITOT:2,PROT:2,ALBUMIN:2 in the last 168 hours No results found for this basename: LIPASE:2,AMYLASE:2 in the last 168 hours No results found for this basename: AMMONIA:2 in the last 168 hours CBC:  Lab 04/23/12 0745  WBC 11.9*  NEUTROABS 9.2*  HGB 13.9  HCT 42.4  MCV 89.5  PLT 273   Cardiac Enzymes:  Lab 04/23/12 0745  CKTOTAL --  CKMB --  CKMBINDEX --  TROPONINI <0.30   BNP: No results found for this basename: PROBNP:3 in the last 168 hours D-Dimer: No results found for this basename: DDIMER:2 in the last 168 hours CBG: No results found for this  basename: GLUCAP:6 in the last 168 hours Hemoglobin A1C: No results found for this basename: HGBA1C in the last 168 hours Fasting Lipid Panel: No results found for this basename: CHOL,HDL,LDLCALC,TRIG,CHOLHDL,LDLDIRECT in the last 960 hours Thyroid Function Tests: No results found for this basename: TSH,T4TOTAL,FREET4,T3FREE,THYROIDAB in the last 168 hours Coagulation: No results found for this basename: LABPROT:4,INR:4 in the last 168 hours Anemia Panel: No results found for this basename: VITAMINB12,FOLATE,FERRITIN,TIBC,IRON,RETICCTPCT in the last 168 hours Urine Drug Screen: Drugs of Abuse  No results found for this basename: labopia,  cocainscrnur,  labbenz,  amphetmu,  thcu,  labbarb    Alcohol Level: No results found for this basename: ETH:2 in the last 168 hours Urinalysis:  Lab 04/23/12 0735  COLORURINE YELLOW  LABSPEC 1.030  PHURINE 5.5  GLUCOSEU NEGATIVE  HGBUR LARGE*  BILIRUBINUR NEGATIVE  KETONESUR NEGATIVE  PROTEINUR 30*  UROBILINOGEN 0.2  NITRITE NEGATIVE  LEUKOCYTESUR MODERATE*   Micro Results: Recent Results (from the past 240 hour(s))  URINE CULTURE     Status: Normal   Collection Time   04/23/12  7:35 AM      Component Value Range Status Comment   Specimen Description URINE, CLEAN CATCH   Final    Special Requests NONE   Final    Culture  Setup Time 04/23/2012 15:24   Final    Colony Count 80,000  COLONIES/ML   Final    Culture     Final    Value: Multiple bacterial morphotypes present, none predominant. Suggest appropriate recollection if clinically indicated.   Report Status 04/24/2012 FINAL   Final    Studies/Results: No results found. Scheduled Meds:    . aspirin EC  81 mg Oral Daily  . cefUROXime  500 mg Oral BID WC  . docusate sodium  100 mg Oral BID  . enoxaparin (LOVENOX) injection  40 mg Subcutaneous Q24H  . ezetimibe  10 mg Oral QPC supper   And  . simvastatin  80 mg Oral QPC supper  . folic acid  1 mg Oral Daily  . furosemide  20  mg Oral Daily  . influenza  inactive virus vaccine  0.5 mL Intramuscular Tomorrow-1000  . levETIRAcetam  250 mg Oral BID  . levothyroxine  175 mcg Oral Daily  . neomycin-bacitracin-polymyxin   Topical BID  . olopatadine  1 drop Both Eyes BID  . pantoprazole  40 mg Oral Q1200  . potassium chloride  10 mEq Oral Daily  . Vitamin D (Ergocalciferol)  50,000 Units Oral Q7 days   Continuous Infusions:  PRN Meds:.acetaminophen, acetaminophen, ALPRAZolam, cyclobenzaprine, HYDROcodone-acetaminophen, ibuprofen, magnesium hydroxide Assessment/Plan: Active Problems:  SEIZURE DISORDER: Had been off her Keppra. She reportedly misplaced her medication for a week. Likely had a seizure which led to her falling off the bed and sustaining fractures  Closed fracture of left proximal humerus  Closed fracture of right proximal humerus  HYPERTENSION  UTI (urinary tract infection): Change to oral and a biotic  HYPOTHYROIDISM  OBESITY  ANXIETY  TOBACCO ABUSE  CORONARY ARTERY DISEASE  COPD  Debility: Had previously been living alone and walking with a walker. Constipation: Already on stool softeners. Add daily laxative until results.  Family again today has multiple questions and concerns. We discussed plans and they voice understanding. Awaiting placement. Right wrist splint ordered.   LOS: 2 days   Wil Slape L 04/25/2012, 3:29 PM

## 2012-04-26 DIAGNOSIS — W19XXXA Unspecified fall, initial encounter: Secondary | ICD-10-CM

## 2012-04-26 MED ORDER — ACETAMINOPHEN 325 MG PO TABS: 650.0000 mg | ORAL_TABLET | ORAL | Status: AC | PRN

## 2012-04-26 MED ORDER — DICLOFENAC SODIUM 1 % TD GEL
2.0000 g | Freq: Four times a day (QID) | TRANSDERMAL | Status: DC | PRN
  Administered 2012-04-26 – 2012-04-28 (×5): 2 g via TOPICAL
  Filled 2012-04-26: qty 100

## 2012-04-26 MED ORDER — BISACODYL 10 MG RE SUPP
10.0000 mg | Freq: Every day | RECTAL | Status: DC
  Administered 2012-04-26: 10 mg via RECTAL
  Filled 2012-04-26 (×4): qty 1

## 2012-04-26 MED ORDER — ALPRAZOLAM 0.25 MG PO TABS: 0.2500 mg | ORAL_TABLET | Freq: Every evening | ORAL | Status: DC | PRN

## 2012-04-26 MED ORDER — MAGNESIUM HYDROXIDE 400 MG/5ML PO SUSP: 30.0000 mL | Freq: Every day | ORAL | Status: AC | PRN

## 2012-04-26 MED ORDER — HYDROCODONE-ACETAMINOPHEN 5-325 MG PO TABS: 0.5000 | ORAL_TABLET | ORAL | Status: AC | PRN

## 2012-04-26 MED ORDER — DSS 100 MG PO CAPS: 100.0000 mg | ORAL_CAPSULE | Freq: Two times a day (BID) | ORAL | Status: AC

## 2012-04-26 NOTE — Progress Notes (Signed)
Physical Therapy Treatment Patient Details Name: [name] MRN: [id number] DOB: 04-Sep-1918 Today's Date: 04/26/2012 Time: 2725-3664 PT Time Calculation (min): 40 min 1 therex 1 gt PT Assessment / Plan / Recommendation Comments on Treatment Session  Patient continues to improve with all mobilty since last seen. Patient was Min A for bed mobility needing most assistance for scooting to EOB secondary to non WB of UE's. Patient only required Min A for  sit<>stand x4 from elevated bed . Patient also completed 6' of gait training with HHA  from bed<>chair. SNF still reccommended  to continue patient's progress to PLOF.    Follow Up Recommendations       Barriers to Discharge        Equipment Recommendations       Recommendations for Other Services    Frequency     Plan      Precautions / Restrictions     Pertinent Vitals/Pain     Mobility  Bed Mobility Bed Mobility: Rolling Left Supine to Sit: 4: Min assist Sitting - Scoot to Edge of Bed: 3: Mod assist Sit to Supine: Not Tested (comment) Details for Bed Mobility Assistance: Patient was able to bridge and move to side of bed and needed only Min A to complete sitting upright;scooting to EOB difficult due to non WB of UE's Transfers Sit to Stand: Without upper extremity assist;From bed;From elevated surface;4: Min assist Stand to Sit: 4: Min assist;Without upper extremity assist;To chair/3-in-1 Details for Transfer Assistance: patient able to repeat sit<>stand 4x Ambulation/Gait Ambulation/Gait Assistance: 2: Max assist (HHA) Ambulation Distance (Feet): 6 Feet Assistive device: 1 person hand held assist Gait Pattern: Trunk flexed;Decreased stance time - right Gait velocity: slow shuffled Stairs: No Wheelchair Mobility Wheelchair Mobility: No    Exercises General Exercises - Upper Extremity Elbow Flexion: AROM;Supine;10 reps;Other (comment) (bilateral) Elbow Extension: AROM;10 reps;Supine;Other (comment) (bilateral) Wrist  Flexion: AROM;10 reps;Seated;Other (comment) (bilateral) Wrist Extension: PROM;AROM;AAROM;Seated;Other (comment);10 reps (PROM on Right, attempted AAROM w/ gravity eliminated/assist) Digit Composite Flexion: AROM;AAROM;10 reps;Seated;Other (comment) (AAROM on right.) Composite Extension: AROM;AAROM;10 reps;Seated;Other (comment) (AAROM with right) General Exercises - Lower Extremity Ankle Circles/Pumps: Both;15 reps Quad Sets: Both;10 reps Gluteal Sets: 10 reps Short Arc Quad: Both;10 reps Heel Slides: Both;10 reps Hip ABduction/ADduction: Both;10 reps Straight Leg Raises: Both;10 reps Hand Exercises Forearm Supination: PROM;AROM;10 reps;Seated Forearm Pronation: PROM;AROM;10 reps;Both;Seated Wrist Flexion: PROM;AROM;Both;10 reps;Seated Wrist Extension: PROM;AROM;Both;10 reps;Seated Digit Composite Flexion: PROM;AROM;Both;10 reps;Seated Composite Extension: PROM;Right;10 reps;Seated Other Exercises Other Exercises: sit<>stand x4   PT Diagnosis:    PT Problem List:   PT Treatment Interventions:     PT Goals    Visit Information  Last PT Received On: 04/26/12 Assistance Needed: +1    Subjective Data      Cognition  Overall Cognitive Status: Appears within functional limits for tasks assessed/performed Arousal/Alertness: Awake/alert Orientation Level: Appears intact for tasks assessed Behavior During Session: [facility] for tasks performed    Balance     End of Session PT - End of Session Equipment Utilized During Treatment: Gait belt Activity Tolerance: Patient tolerated treatment well Patient left: in chair;with call bell/phone within reach;with family/visitor present Nurse Communication: Mobility status   GP     Basilia Stuckert ATKINSO 04/26/2012, 3:22 PM

## 2012-04-26 NOTE — Discharge Summary (Addendum)
Physician Discharge Summary  Patient ID: [name] MRN: [id number] DOB/AGE: 1918-09-10 78 y.o.  Admit date: 04/23/2012 Discharge date: 04/26/2012  Discharge Diagnoses:  Active Problems:  SEIZURE DISORDER  Closed fracture of left proximal humerus  Closed fracture of right proximal humerus  HYPERTENSION  UTI (urinary tract infection)  HYPOTHYROIDISM  OBESITY  ANXIETY  TOBACCO ABUSE  CORONARY ARTERY DISEASE  COPD  Debility  Radial nerve dysfunction    Medication List     As of 04/26/2012  2:45 PM    TAKE these medications         acetaminophen 325 MG tablet   Commonly known as: TYLENOL   Take 2 tablets (650 mg total) by mouth every 4 (four) hours as needed (or Fever >/= 101).      ALPRAZolam 0.25 MG tablet   Commonly known as: XANAX   Take 1 tablet (0.25 mg total) by mouth at bedtime as needed for sleep.      aspirin EC 81 MG tablet   Take 81 mg by mouth daily.      DSS 100 MG Caps   Take 100 mg by mouth 2 (two) times daily.      ezetimibe-simvastatin 10-80 MG per tablet   Commonly known as: VYTORIN   Take 1 tablet by mouth at bedtime.      folic acid 1 MG tablet   Commonly known as: FOLVITE   Take 1 mg by mouth daily.      furosemide 40 MG tablet   Commonly known as: LASIX   Take 20-40 mg by mouth daily as needed. Swelling      HYDROcodone-acetaminophen 5-325 MG per tablet   Commonly known as: NORCO/VICODIN   Take 0.5-1 tablets by mouth every 4 (four) hours as needed for pain.      levETIRAcetam 250 MG tablet   Commonly known as: KEPPRA   Take 250 mg by mouth 2 (two) times daily.      levothyroxine 175 MCG tablet   Commonly known as: SYNTHROID, LEVOTHROID   Take 175 mcg by mouth daily.      magnesium hydroxide 400 MG/5ML suspension   Commonly known as: MILK OF MAGNESIA   Take 30 mLs by mouth daily as needed for constipation.      PATADAY OP   Apply 1 drop to eye daily as needed. Itching      potassium chloride 10 MEQ tablet   Commonly known  as: K-DUR   Take 1 tablet (10 mEq total) by mouth daily.      Vitamin D (Ergocalciferol) 50000 UNITS Caps   Commonly known as: DRISDOL   Take 50,000 Units by mouth every 7 (seven) days. Usually on Sundays            Discharge Orders    Future Appointments: Provider: Department: Dept Phone: Center:   05/10/2012 10:45 AM Salley Scarlet, MD [facility] [phone] [facility]     Future Orders Please Complete By Expires   Diet - low sodium heart healthy      Walk with assistance      Discharge instructions      Comments:   Keep right hand in splint.  Keep both arms in slings for 2 weeks.  Needs assistance with all meals.  At 2 weeks, start OT for upper extremity strengthening      Follow-up Information    Follow up with Fuller Canada, MD. In 2 weeks.   Contact information:   [address]  [address] [place] [address] [phone]          Disposition: SNF  Discharged Condition: stable  Consults: Treatment Team:  Beryle Beams, MD Vickki Hearing, MD  Labs:    Sodium      138      Potassium      3.8      Chloride      106      CO2      23      BUN      14      Creatinine, Ser      0.84      Calcium      9.3      GFR calc non Af Amer      59      GFR calc Af Amer      68       Glucose, Bld      94       CARDIAC PROFILE    Troponin I      <0.30        CBC    WBC      11.9      RBC      4.74      Hemoglobin      13.9      HCT      42.4      MCV      89.5      MCH      29.3      MCHC      32.8      RDW      12.8      Platelets      273       DIFFERENTIAL    Neutrophils Relative      77      Lymphocytes Relative      10      Monocytes Relative      13      Eosinophils Relative      0      Basophils Relative      0      Neutro Abs      9.2      Lymphs Abs      1.2      Monocytes Absolute      1.6      Eosinophils Absolute      0.0      Basophils Absolute      0.0       DIABETES    Glucose, Bld       94       URINALYSIS    Color, Urine      YELLOW      APPearance      HAZY      Specific Gravity, Urine      1.030      pH      5.5      Glucose, UA      NEGATIVE      Bilirubin Urine      NEGATIVE      Ketones, ur      NEGATIVE      Protein, ur      30      Urobilinogen, UA      0.2      Nitrite      NEGATIVE      Leukocytes, UA      MODERATE  Hgb urine dipstick      LARGE      WBC, UA      TOO NUMEROUS TO COUNT      RBC / HPF      TOO NUMEROUS TO COUNT      Squamous Epithelial / LPF      RARE      Bacteria, UA      MANY       Diagnostics:  Dg Chest 1 View  04/23/2012  *RADIOLOGY REPORT*  Clinical Data: Fall  CHEST - 1 VIEW  Comparison: 09/20/2010  Findings: Heart is borderline enlarged.  Vascular congestion without interstitial edema.  Aortic knob is distinct.  No pneumothorax.  No consolidation.  Persistent elevation of the left hemidiaphragm.  No obvious acute bony deformity.  IMPRESSION: Borderline cardiomegaly without edema.   Original Report Authenticated By: Donavan Burnet, M.D.    Dg Shoulder Right  04/23/2012  *RADIOLOGY REPORT*  Clinical Data: Fall  RIGHT SHOULDER - 2+ VIEW  Comparison: None.  Findings: There is lucency extending across the humeral head. Nondisplaced fracture is not excluded.  Normal obvious cortical step off.  There is apparent impaction of the humeral neck into the humeral head associated with a sclerotic band which may also represent a fracture  IMPRESSION: Above findings are worrisome for a nondisplaced and impaction fracture of the humeral neck and head.   Original Report Authenticated By: Donavan Burnet, M.D.    Dg Forearm Right  04/23/2012  *RADIOLOGY REPORT*  Clinical Data: Fall, pain.  RIGHT FOREARM - 2 VIEW  Comparison: None.  Findings: Degenerative changes in the right wrist and right elbow. No acute bony abnormality.  No fracture, subluxation or dislocation.  Soft tissues are intact.  IMPRESSION: No acute bony abnormality.   Original Report  Authenticated By: Cyndie Chime, M.D.    Ct Head Wo Contrast  04/23/2012  *RADIOLOGY REPORT*  Clinical Data:  Fall.  Abrasions to forehead.  CT HEAD WITHOUT CONTRAST CT CERVICAL SPINE WITHOUT CONTRAST  Technique:  Multidetector CT imaging of the head and cervical spine was performed following the standard protocol without intravenous contrast.  Multiplanar CT image reconstructions of the cervical spine were also generated.  Comparison:  None.  CT HEAD  Findings: There is atrophy and chronic small vessel disease changes.  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  Old right thalamic lacunar infarct, stable. No acute intracranial abnormality.  Specifically, no hemorrhage, hydrocephalus, mass lesion, acute infarction, or significant intracranial injury.  No acute calvarial abnormality. Visualized paranasal sinuses and mastoids clear.  Orbital soft tissues unremarkable.  IMPRESSION: No acute intracranial abnormality.  CT CERVICAL SPINE  Findings: Diffuse degenerative disc disease and facet disease. Normal alignment.  Prevertebral soft tissues are normal.  No fracture, epidural or paraspinal hematoma.  IMPRESSION: Diffuse degenerative changes.  No acute bony abnormality.   Original Report Authenticated By: Cyndie Chime, M.D.    Ct Cervical Spine Wo Contrast  04/23/2012  *RADIOLOGY REPORT*  Clinical Data:  Fall.  Abrasions to forehead.  CT HEAD WITHOUT CONTRAST CT CERVICAL SPINE WITHOUT CONTRAST  Technique:  Multidetector CT imaging of the head and cervical spine was performed following the standard protocol without intravenous contrast.  Multiplanar CT image reconstructions of the cervical spine were also generated.  Comparison:  None.  CT HEAD  Findings: There is atrophy and chronic small vessel disease changes.  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  Old right thalamic lacunar infarct, stable. No acute intracranial abnormality.  Specifically, no hemorrhage, hydrocephalus, mass lesion, acute infarction,  or significant intracranial injury.  No acute calvarial abnormality. Visualized paranasal sinuses and mastoids clear.  Orbital soft tissues unremarkable.  IMPRESSION: No acute intracranial abnormality.  CT CERVICAL SPINE  Findings: Diffuse degenerative disc disease and facet disease. Normal alignment.  Prevertebral soft tissues are normal.  No fracture, epidural or paraspinal hematoma.  IMPRESSION: Diffuse degenerative changes.  No acute bony abnormality.   Original Report Authenticated By: Cyndie Chime, M.D.    Dg Shoulder Left  04/23/2012  *RADIOLOGY REPORT*  Clinical Data: Fall  LEFT SHOULDER - 2+ VIEW  Comparison: 12/13/2004  Findings: Minimally displaced fracture involving the articular surface of the lateral humeral head is present.  This likely represents a greater tuberosity fragment.  Degenerative changes are superimposed with prominent inferior AC joint osteophytes. Prominent degenerative change of the glenohumeral joint.  IMPRESSION: Acute minimally displaced fracture of the humeral head.  This likely represents the greater tuberosity.   Original Report Authenticated By: Donavan Burnet, M.D.    Dg Knee Complete 4 Views Right  04/23/2012  *RADIOLOGY REPORT*  Clinical Data: Fall  RIGHT KNEE - COMPLETE 4+ VIEW  Comparison: None.  Findings: Total knee are there is no breakage or loosening of the hardware.  No acute fracture.  No dislocation.  IMPRESSION: Total knee arthroplasty anatomically aligned.  No acute bony pathology.   Original Report Authenticated By: Donavan Burnet, M.D.    Dg Humerus Left  04/23/2012  *RADIOLOGY REPORT*  Clinical Data: Fall, chest pain, shoulder pain.  LEFT HUMERUS - 2+ VIEW  Comparison: None.  Findings: Advanced degenerative changes in the left shoulder. No acute bony abnormality.  Specifically, no fracture, subluxation, or dislocation.  Soft tissues are intact.  IMPRESSION: No acute bony abnormality.   Original Report Authenticated By: Cyndie Chime, M.D.      Procedures: none  EKG: Normal sinus rhythm Right bundle branch block  Full Code   Hospital Course:  See H&P for complete admission details. Ms. Baydoun is a 78 year old black female who lives alone and has a history of seizures. She presented after having woken up on the floor near her bed and complaining of bilateral shoulder pain, right greater than left. She does not recall how she came to be on the floor. She did report that she had been off her Keppra for a week, because she misplaced it. In the emergency room, she was noted to have bilateral shoulder pain and decreased range of motion in the shoulders due to pain, right wrist drop. She also had stage I sacral decubitus. Multiple imaging films were done and she was found to have bilateral proximal humerus fractures. Prior to this episode, she had been living alone and gets around with a walker. She likely had a seizure from being off her antiepileptic drugs.  Orthopedics and neurology were consulted. Orthopedics recommended bilateral arm sling this and a wrist splint on the right because of the wrist drop. Patient to work with physical therapy and would benefit from skilled nursing facility placement. She will need to followup with Dr. Romeo Apple in 2 weeks for repeat x-rays and evaluation.  On admission she was found to have a urinary tract infection and has been treated sufficiently in house. She needs no outpatient antibiotics at this time.  She did have problems with constipation and received stool softeners and laxatives while here. She is currently awaiting a skilled nursing facility bed. Total time on the day of discharge greater  than 30 minutes.  Discharge Exam:  Blood pressure 112/67, pulse 74, temperature 98.1 F (36.7 C), temperature source Oral, resp. rate 18, height 5\' 8"  (1.727 m), weight 94.7 kg (208 lb 12.4 oz), SpO2 93.00%.  Gen:  More comfortable, interactive.  Smiling Lungs:  CTA without WRR CV RRR without  MGR Abd:  S, NT, ND Ext:  Right hand edematous. Splint  And slings in place. Legs not edematous.  SignedChristiane Ha 04/26/2012, 2:45 PM

## 2012-04-26 NOTE — Progress Notes (Signed)
CARE MANAGEMENT NOTE 04/26/2012  Patient:  [name], [name]   Account Number:  [id number]  Date Initiated:  04/26/2012  Documentation initiated by:  Rosemary Holms  Subjective/Objective Assessment:   Pt admitted from home after falling and breaking both arms.     Action/Plan:   Pt DC'd to SNF but unable to get an authorization from [place] to DC pt to [facility] in [place]. CSW able to get approval fast tracked to [facility] from [facility] but daughters refusing to allow mother to DC due to the distance from family mem..   Anticipated DC Date:  04/26/2012   Anticipated DC Plan:  SKILLED NURSING FACILITY  In-house referral  Clinical Social Worker         Choice offered to / List presented to:             Status of service:  In process, will continue to follow Medicare Important Message given?  YES (If response is "NO", the following Medicare IM given date fields will be blank) Date Medicare IM given:  04/26/2012 Date Additional Medicare IM given:    Discharge Disposition:    Per UR Regulation:    If discussed at Long Length of Stay Meetings, dates discussed:    Comments:  04/26/12 1630 Blakeley Margraf RN BSN CM IM given and explained in detail to two daughters. CM advised AC of situation and to pick up IM from room. Anticipate appeal to [employer].

## 2012-04-26 NOTE — Progress Notes (Signed)
Occupational Therapy Treatment Patient Details Name: [name] MRN: [id number] DOB: 29-Sep-1918 Today's Date: 04/26/2012 Time: [phone]      (splinting from 1:00 to 1:45) OT Time Calculation (min): 44 min  OT Assessment / Plan / Recommendation Comments on Treatment Session A: Fabricated resting hand splint for right hand. Education provided to patient and 3 family members on care of splint, precautions (swelling, pain, skin breakdown) and wear time. All give verbally and with a handout  Patient had moderate difficulty to get out of bed to [facility].  Total assist for hygiene.                   Plan Discharge plan remains appropriate    Precautions / Restrictions     OT Goals Acute Rehab OT Goals OT Goal Formulation: With patient Time For Goal Achievement: 05/09/12 Potential to Achieve Goals: Good ADL Goals Pt Will Perform Eating: with set-up (finger foods only) ADL Goal: Eating - Progress: Progressing toward goals Arm Goals Pt Will Perform AROM: with minimal assist;Bilateral upper extremities;to maintain range of motion Arm Goal: AROM - Progress: Progressing toward goal Miscellaneous OT Goals Miscellaneous OT Goal #1: Patient will complete functional transfers with min-mod assist. OT Goal: Miscellaneous Goal #1 - Progress: Progressing toward goals Miscellaneous OT Goal #2: Patient will be able to instruct caregiver on donning/doffing right wrist brace.   OT Goal: Miscellaneous Goal #2 - Progress: Progressing toward goals  Visit Information  Last OT Received On: 04/26/12 Assistance Needed: +1    Subjective Data   I keep wetting myself, what is wrong with me.   Prior Functioning       Cognition  Overall Cognitive Status: Appears within functional limits for tasks assessed/performed Arousal/Alertness: Awake/alert Orientation Level: Appears intact for tasks assessed Behavior During Session: [facility] for tasks performed    Mobility  Shoulder Instructions Bed Mobility Bed  Mobility: Rolling Left Supine to Sit: 2: Max assist Sitting - Scoot to Edge of Bed: 2: Max assist Details for Bed Mobility Assistance: patient non wt bearing bilateral UE and has great difficulty scooting herself forward without the use of her arms. Transfers Sit to Stand: 3: Mod assist   Donning/doffing sling/immobilizer: Moderate assistance Correct positioning of sling/immobilizer: Moderate assistance   Exercises  General Exercises - Upper Extremity Elbow Flexion: AROM;Supine;10 reps;Other (comment) (bilateral) Elbow Extension: AROM;10 reps;Supine;Other (comment) (bilateral) Wrist Flexion: AROM;10 reps;Seated;Other (comment) (bilateral) Wrist Extension: PROM;AROM;AAROM;Seated;Other (comment);10 reps (PROM on Right, attempted AAROM w/ gravity eliminated/assist) Digit Composite Flexion: AROM;AAROM;10 reps;Seated;Other (comment) (AAROM on right.) Composite Extension: AROM;AAROM;10 reps;Seated;Other (comment) (AAROM with right) Hand Exercises Forearm Supination: PROM;AROM;10 reps;Seated Forearm Pronation: PROM;AROM;10 reps;Both;Seated Wrist Flexion: PROM;AROM;Both;10 reps;Seated Wrist Extension: PROM;AROM;Both;10 reps;Seated Digit Composite Flexion: PROM;AROM;Both;10 reps;Seated Composite Extension: PROM;Right;10 reps;Seated   Balance     End of Session OT - End of Session Equipment Utilized During Treatment: Gait belt;Other (comment) (bilateral slings and wrist splint) Activity Tolerance: Patient tolerated treatment well Patient left: in chair;with call bell/phone within reach;with chair alarm set  GO     Margert Edsall L 04/26/2012, 3:05 PM

## 2012-04-26 NOTE — Progress Notes (Signed)
04/26/12 1710 patient up to chair this afternoon with PT assist per patient, tolerated sitting up well. C/o left hand pain, stated 10/10 aching pain. Repositioned hand for comfort, vicodin as ordered for pain. Notified Dr Lendell Caprice, pt c/o pain and requesting arthritis cream if possible for left hand. Stated would place order. Pt assisted back to bed, repositioned for comfort, daughter at bedside. bedalarm on for safety, call light within reach. [name]

## 2012-04-26 NOTE — Progress Notes (Signed)
04/26/12 1933 Patient able to turn self at times, assist as needed. Repositioned with pillows frequently during day for comfort, patient preferred not to turn at times, preferred arms just be elevated on pillows.  Nursing to monitor and reposition as needed/desired. [name]

## 2012-04-26 NOTE — Clinical Social Work Note (Addendum)
Pt d/c today and bed at [facility] is available. Pt given IM by care manager and they have decided to appeal d/c. CSW will follow up.   Derenda Fennel, [place] [phone]

## 2012-04-27 DIAGNOSIS — R569 Unspecified convulsions: Secondary | ICD-10-CM

## 2012-04-27 NOTE — Progress Notes (Signed)
04/27/12 1838 Discussed rationale for turning/repositioning with patient, daughter Kennon Rounds at bedside. Patient stated was comfortable sitting up in bed with pillow support at this time. Preferred not to be turned/repositioned at this time. Encouraged to call as needed, call light within reach, bedalarm on for safety. Riccardo Dubin

## 2012-04-27 NOTE — Care Management Note (Signed)
Talked with a daughter this afternoon.  Detailed notice of discharged given to daughter per request of Angelique Blonder at [facility].  Chart, detailed notice and appeal papers faxed to [facility] at [facility].

## 2012-04-27 NOTE — Progress Notes (Signed)
Subjective: The patient has no active complaints of pain. She is getting pain medicine as needed. No complaints of shortness of breath or chest congestion.  Objective: Vital signs in last 24 hours: Filed Vitals:   04/26/12 1130 04/26/12 1500 04/26/12 2056 04/27/12 0457  BP: 112/67 127/80 114/72 110/69  Pulse:  80 78 78  Temp:  98.4 F (36.9 C) 98.3 F (36.8 C) 98.9 F (37.2 C)  TempSrc:   Oral Oral  Resp:  18 18 18   Height:      Weight:      SpO2:  94% 96% 93%    Intake/Output Summary (Last 24 hours) at 04/27/12 1032 Last data filed at 04/26/12 1700  Gross per 24 hour  Intake    480 ml  Output    350 ml  Net    130 ml    Weight change:   Physical exam: General: Pleasant obese 78 year old African-American woman sitting up in bed, in no acute distress. Lungs: Clear anteriorly with decreased breath sounds in the bases. Heart: S1, S2, with a soft systolic murmur. Abdomen: Obese, positive bowel sounds, soft, nontender, nondistended. Extremities: Trace of pedal edema bilaterally. Right hand slightly edematous. She is able to wiggle her fingers on both hands. Splint in place. Bilateral upper extremity slings in place. Dressing on left knee, clean and dry. Not taken off but will be examined later on today or tomorrow.   Lab Results: Basic Metabolic Panel: No results found for this basename: NA:2,K:2,CL:2,CO2:2,GLUCOSE:2,BUN:2,CREATININE:2,CALCIUM:2,MG:2,PHOS:2 in the last 72 hours Liver Function Tests: No results found for this basename: AST:2,ALT:2,ALKPHOS:2,BILITOT:2,PROT:2,ALBUMIN:2 in the last 72 hours No results found for this basename: LIPASE:2,AMYLASE:2 in the last 72 hours No results found for this basename: AMMONIA:2 in the last 72 hours CBC: No results found for this basename: WBC:2,NEUTROABS:2,HGB:2,HCT:2,MCV:2,PLT:2 in the last 72 hours Cardiac Enzymes: No results found for this basename: CKTOTAL:3,CKMB:3,CKMBINDEX:3,TROPONINI:3 in the last 72 hours BNP: No  results found for this basename: PROBNP:3 in the last 72 hours D-Dimer: No results found for this basename: DDIMER:2 in the last 72 hours CBG: No results found for this basename: GLUCAP:6 in the last 72 hours Hemoglobin A1C: No results found for this basename: HGBA1C in the last 72 hours Fasting Lipid Panel: No results found for this basename: CHOL,HDL,LDLCALC,TRIG,CHOLHDL,LDLDIRECT in the last 72 hours Thyroid Function Tests: No results found for this basename: TSH,T4TOTAL,FREET4,T3FREE,THYROIDAB in the last 72 hours Anemia Panel: No results found for this basename: VITAMINB12,FOLATE,FERRITIN,TIBC,IRON,RETICCTPCT in the last 72 hours Coagulation: No results found for this basename: LABPROT:2,INR:2 in the last 72 hours Urine Drug Screen: Drugs of Abuse  No results found for this basename: labopia, cocainscrnur, labbenz, amphetmu, thcu, labbarb    Alcohol Level: No results found for this basename: ETH:2 in the last 72 hours Urinalysis: No results found for this basename: COLORURINE:2,APPERANCEUR:2,LABSPEC:2,PHURINE:2,GLUCOSEU:2,HGBUR:2,BILIRUBINUR:2,KETONESUR:2,PROTEINUR:2,UROBILINOGEN:2,NITRITE:2,LEUKOCYTESUR:2 in the last 72 hours Misc. Labs:   Micro: Recent Results (from the past 240 hour(s))  URINE CULTURE     Status: Normal   Collection Time   04/23/12  7:35 AM      Component Value Range Status Comment   Specimen Description URINE, CLEAN CATCH   Final    Special Requests NONE   Final    Culture  Setup Time 04/23/2012 15:24   Final    Colony Count 80,000 COLONIES/ML   Final    Culture     Final    Value: Multiple bacterial morphotypes present, none predominant. Suggest appropriate recollection if clinically indicated.   Report Status 04/24/2012 FINAL  Final     Studies/Results: No results found.  Medications: and an in and Scheduled:   . aspirin EC  81 mg Oral Daily  . bisacodyl  10 mg Rectal Daily  . cefUROXime  500 mg Oral BID WC  . docusate sodium  100 mg Oral  BID  . enoxaparin (LOVENOX) injection  40 mg Subcutaneous Q24H  . ezetimibe  10 mg Oral QPC supper   And  . simvastatin  80 mg Oral QPC supper  . folic acid  1 mg Oral Daily  . furosemide  20 mg Oral Daily  . levETIRAcetam  250 mg Oral BID  . levothyroxine  175 mcg Oral Daily  . magnesium hydroxide  30 mL Oral Daily  . neomycin-bacitracin-polymyxin   Topical BID  . olopatadine  1 drop Both Eyes BID  . pantoprazole  40 mg Oral Q1200  . potassium chloride  10 mEq Oral Daily  . Vitamin D (Ergocalciferol)  50,000 Units Oral Q7 days   Continuous:  ZOX:[id number], acetaminophen, ALPRAZolam, cyclobenzaprine, diclofenac sodium, HYDROcodone-acetaminophen, ibuprofen  Assessment: Active Problems:  HYPOTHYROIDISM  OBESITY  ANXIETY  TOBACCO ABUSE  HYPERTENSION  CORONARY ARTERY DISEASE  COPD  SEIZURE DISORDER  Fracture of left humerus  Fracture of right humerus  UTI (urinary tract infection)  Closed fracture of left proximal humerus  Closed fracture of right proximal humerus  Debility  Radial nerve dysfunction     The patient is stable for discharge. However, the family is requesting that the patient be discharged to a skilled nursing facility closer band overcounting, preferably in [place] to be closer to her family. They are appealing to discharge. We'll continue supportive treatment. We'll continue antibiotic orally until discharge.  Plan:  As above.  LOS: 4 days   Meribeth Vitug 04/27/2012, 10:32 AM

## 2012-04-27 NOTE — Progress Notes (Signed)
04/27/12 1524 Patient's daughter Kennon Rounds at bedside this afternoon, requested patient have all four bed rails up for safety. Stated was concerned with risk of falling out of bed, since pt had fallen out of bed at home. Discussed daughter's concerns with nursing supervisor, stated okay for all bed rails up for safety d/t hx of seizures and fall. Bed rails up as requested, pt repositioned for comfort, bedalarm for safety, call light within reach. Daughter at bedside. Riccardo Dubin

## 2012-04-28 DIAGNOSIS — IMO0002 Reserved for concepts with insufficient information to code with codable children: Secondary | ICD-10-CM

## 2012-04-28 DIAGNOSIS — S80219A Abrasion, unspecified knee, initial encounter: Secondary | ICD-10-CM | POA: Diagnosis present

## 2012-04-28 DIAGNOSIS — D649 Anemia, unspecified: Secondary | ICD-10-CM

## 2012-04-28 LAB — CBC
Hemoglobin: 11.6 g/dL — ABNORMAL LOW (ref 12.0–15.0)
RBC: 3.98 MIL/uL (ref 3.87–5.11)

## 2012-04-28 LAB — BASIC METABOLIC PANEL
GFR calc Af Amer: 66 mL/min — ABNORMAL LOW (ref >90)
GFR calc non Af Amer: 57 mL/min — ABNORMAL LOW (ref >90)
Potassium: 3.9 mEq/L (ref 3.5–5.1)
Sodium: 134 mEq/L — ABNORMAL LOW (ref 135–145)

## 2012-04-28 NOTE — Care Management (Signed)
Denise from [employer] called and stated chart review completed and on 04/29/12 at 12 noon she will assume the cost from the hospital.  Angelique Blonder said the [id number] can be issued by CM in am.

## 2012-04-28 NOTE — Progress Notes (Signed)
Subjective: The patient has some pain and stiffness in her left hand, but the vault ferritin gel is helping. No active complaints of bilateral arm pain or knee pain.  Objective: Vital signs in last 24 hours: Filed Vitals:   04/27/12 0457 04/27/12 1500 04/27/12 2123 04/28/12 0604  BP: 110/69 124/77 131/76 144/85  Pulse: 78 66 61 73  Temp: 98.9 F (37.2 C) 98 F (36.7 C) 98.1 F (36.7 C) 98.4 F (36.9 C)  TempSrc: Oral  Oral Oral  Resp: 18 18 18 18   Height:      Weight:      SpO2: 93% 95% 96% 96%    Intake/Output Summary (Last 24 hours) at 04/28/12 1111 Last data filed at 04/27/12 1700  Gross per 24 hour  Intake    480 ml  Output      0 ml  Net    480 ml    Weight change:   Physical exam: General: Pleasant obese 78 year old African-American woman sitting up in bed, in no acute distress. Lungs: Clear anteriorly with decreased breath sounds in the bases. Heart: S1, S2, with a soft systolic murmur. Abdomen: Obese, positive bowel sounds, soft, nontender, nondistended. Extremities: Trace of pedal edema bilaterally. Right hand slightly edematous. She is able to wiggle her fingers on both hands. Right upper extremity splint in place. Bilateral upper extremity slings in place. Mild tenderness over the left hand, but no warmth or edema or synovitis. Left knee with superficial ulcer/abrasion. No. When drainage. No surrounding erythema. Scant [place] abrasion on the right knee.   Lab Results: Basic Metabolic Panel:  Basename 04/28/12 0655  NA 134*  K 3.9  CL 99  CO2 27  GLUCOSE 83  BUN 18  CREATININE 0.86  CALCIUM 9.7  MG --  PHOS --   Liver Function Tests: No results found for this basename: AST:2,ALT:2,ALKPHOS:2,BILITOT:2,PROT:2,ALBUMIN:2 in the last 72 hours No results found for this basename: LIPASE:2,AMYLASE:2 in the last 72 hours No results found for this basename: AMMONIA:2 in the last 72 hours CBC:  Basename 04/28/12 0655  WBC 6.5  NEUTROABS --  HGB 11.6*  HCT  35.5*  MCV 89.2  PLT 263   Cardiac Enzymes: No results found for this basename: CKTOTAL:3,CKMB:3,CKMBINDEX:3,TROPONINI:3 in the last 72 hours BNP: No results found for this basename: PROBNP:3 in the last 72 hours D-Dimer: No results found for this basename: DDIMER:2 in the last 72 hours CBG: No results found for this basename: GLUCAP:6 in the last 72 hours Hemoglobin A1C: No results found for this basename: HGBA1C in the last 72 hours Fasting Lipid Panel: No results found for this basename: CHOL,HDL,LDLCALC,TRIG,CHOLHDL,LDLDIRECT in the last 72 hours Thyroid Function Tests: No results found for this basename: TSH,T4TOTAL,FREET4,T3FREE,THYROIDAB in the last 72 hours Anemia Panel: No results found for this basename: VITAMINB12,FOLATE,FERRITIN,TIBC,IRON,RETICCTPCT in the last 72 hours Coagulation: No results found for this basename: LABPROT:2,INR:2 in the last 72 hours Urine Drug Screen: Drugs of Abuse  No results found for this basename: labopia,  cocainscrnur,  labbenz,  amphetmu,  thcu,  labbarb    Alcohol Level: No results found for this basename: ETH:2 in the last 72 hours Urinalysis: No results found for this basename: COLORURINE:2,APPERANCEUR:2,LABSPEC:2,PHURINE:2,GLUCOSEU:2,HGBUR:2,BILIRUBINUR:2,KETONESUR:2,PROTEINUR:2,UROBILINOGEN:2,NITRITE:2,LEUKOCYTESUR:2 in the last 72 hours Misc. Labs:   Micro: Recent Results (from the past 240 hour(s))  URINE CULTURE     Status: Normal   Collection Time   04/23/12  7:35 AM      Component Value Range Status Comment   Specimen Description URINE, CLEAN CATCH  Final    Special Requests NONE   Final    Culture  Setup Time 04/23/2012 15:24   Final    Colony Count 80,000 COLONIES/ML   Final    Culture     Final    Value: Multiple bacterial morphotypes present, none predominant. Suggest appropriate recollection if clinically indicated.   Report Status 04/24/2012 FINAL   Final     Studies/Results: No results found.  Medications:  and an in and Scheduled:    . aspirin EC  81 mg Oral Daily  . bisacodyl  10 mg Rectal Daily  . cefUROXime  500 mg Oral BID WC  . docusate sodium  100 mg Oral BID  . enoxaparin (LOVENOX) injection  40 mg Subcutaneous Q24H  . ezetimibe  10 mg Oral QPC supper   And  . simvastatin  80 mg Oral QPC supper  . folic acid  1 mg Oral Daily  . furosemide  20 mg Oral Daily  . levETIRAcetam  250 mg Oral BID  . levothyroxine  175 mcg Oral Daily  . magnesium hydroxide  30 mL Oral Daily  . neomycin-bacitracin-polymyxin   Topical BID  . olopatadine  1 drop Both Eyes BID  . pantoprazole  40 mg Oral Q1200  . potassium chloride  10 mEq Oral Daily  . Vitamin D (Ergocalciferol)  50,000 Units Oral Q7 days   Continuous:  WUJ:[id number], acetaminophen, ALPRAZolam, cyclobenzaprine, diclofenac sodium, HYDROcodone-acetaminophen, ibuprofen  Assessment: Active Problems:  HYPOTHYROIDISM  OBESITY  ANXIETY  TOBACCO ABUSE  HYPERTENSION  CORONARY ARTERY DISEASE  COPD  SEIZURE DISORDER  Fracture of left humerus  Fracture of right humerus  UTI (urinary tract infection)  Closed fracture of left proximal humerus  Closed fracture of right proximal humerus  Debility  Radial nerve dysfunction  Seizure  Anemia  Knee abrasion     The patient is stable for discharge. However, the family is requesting that the patient be discharged to a skilled nursing facility closer to home, preferably in [place]. They are appealing the discharge. We'll continue supportive treatment and wound care. Left knee abrasion/ulcer shows no signs of infection. We'll continue Ceftin orally until discharge for treatment of the UTI.  Plan:  As above.  LOS: 5 days   [name] 04/28/2012, 11:11 AM

## 2012-04-28 NOTE — Progress Notes (Signed)
04/28/12 1635 Patient IV site due to be changed, but within normal limits, patient is difficult IV stick per report and does not care to be stuck if possible. Notified Dr Sherrie Mustache, stated okay to leave current access in since within normal limits. Pt also requested her daughter bring her some flounder for supper. Discussed with Dr Sherrie Mustache since pt is on heart healthy diet, stated ok for patient to have flounder, cole slaw, and/or plain baked potato, but no hush puppies, fries, greasy/salty foods. Discussed with patient and family, pt agreeable. Riccardo Dubin

## 2012-04-28 NOTE — Progress Notes (Signed)
04/28/12 1146 Patient c/o TED hose feeling too tight this morning, removed for patient comfort. Notified Dr Sherrie Mustache and discussed switching to SCDs instead, stated was okay. Patient assisted up to chair, tolerated well. Positioned with pillows for comfort, chair alarm in place for safety, daughter and son at bedside. Riccardo Dubin

## 2012-04-29 DIAGNOSIS — L89152 Pressure ulcer of sacral region, stage 2: Secondary | ICD-10-CM | POA: Diagnosis present

## 2012-04-29 MED ORDER — DICLOFENAC SODIUM 1 % TD GEL: 2.0000 g | Freq: Four times a day (QID) | TRANSDERMAL | Status: DC | PRN

## 2012-04-29 NOTE — Clinical Social Work Placement (Signed)
Clinical Social Work Department CLINICAL SOCIAL WORK PLACEMENT NOTE 04/29/2012  Patient:  [name], [name]  Account Number:  [id number] Admit date:  04/23/2012  Clinical Social Worker:  Sherrlyn Hock  Date/time:  04/24/2012 02:40 PM  Clinical Social Work is seeking post-discharge placement for this patient at the following level of care:   SKILLED NURSING   (*CSW will update this form in Epic as items are completed)   04/24/2012  Patient/family provided with [facility] Department of Clinical Social Work's list of facilities offering this level of care within the geographic area requested by the patient (or if unable, by the patient's family).  04/24/2012  Patient/family informed of their freedom to choose among providers that offer the needed level of care, that participate in Medicare, Medicaid or managed care program needed by the patient, have an available bed and are willing to accept the patient.  04/24/2012  Patient/family informed of [facility]' ownership interest in [facility], as well as of the fact that they are under no obligation to receive care at this facility.  PASARR submitted to EDS on  PASARR number received from [employer] on   FL2 transmitted to all facilities in geographic area requested by pt/family on  04/24/2012 FL2 transmitted to all facilities within larger geographic area on   Patient informed that his/her managed care company has contracts with or will negotiate with  certain facilities, including the following:     Patient/family informed of bed offers received:  04/29/2012 Patient chooses bed at [facility] Physician recommends and patient chooses bed at  [facility]  Patient to be transferred to [facility] on  04/29/2012 Patient to be transferred to facility by [facility]  The following physician request were entered in Epic:   Additional Comments: Previous pasarr number  Derenda Fennel,  LCSW [phone]

## 2012-04-29 NOTE — Clinical Social Work Note (Signed)
CSW spoke with pt's daughter Melford Aase around 12:15 regarding d/c. She was aware that appeal ended at noon today. However, by noon there was no authorization for [employer] at [facility]. Earleen notified that [facility] had received authorization and we could proceed with d/c there. She states they want to wait for a few hours and see if authorization is received at [facility] by then. Melford Aase became very upset with CSW due to a misunderstanding about Friday's d/c and [facility]'s ability to take pt based on a pilot program with [employer]. She said that did not make sense to her why one facility could take her and not another one. CSW tried to explain again as did Elease Hashimoto at [facility] that their facility required authorization before transferring pt. CSW received call from [facility] representative stating that her request would be approved for SNF but this had not been communicated to [facility] yet.  Earleen requested to speak with CSW supervisor and names and contact information were provided. She states they are considering involving their lawyer as well. Awaiting call from [facility].  Derenda Fennel, [place] [phone]

## 2012-04-29 NOTE — Discharge Summary (Signed)
Physician Discharge Summary  Patient ID: [name] MRN: [id number] DOB/AGE: 76-08-1918 76 y.o.  ADDENDUM:  Admit date: 04/23/2012 Discharge date: 04/29/2012  Discharge Diagnoses:  Active Problems:  SEIZURE DISORDER  Closed fracture of left proximal humerus  Closed fracture of right proximal humerus  HYPERTENSION  UTI (urinary tract infection)  HYPOTHYROIDISM  OBESITY  ANXIETY  TOBACCO ABUSE  CORONARY ARTERY DISEASE  COPD  Debility  Radial nerve dysfunction. Mild anemia Mild hyponatremia. Stage II decubitus sacral ulcer. Apply Tegaderm every 3-5 days or per the wound care protocol at the skilled nursing facility. Left knee abrasion. Cleaned with normal saline daily and apply nonstick Telfa and secure with gauze.    Medication List     As of 04/29/2012 10:52 AM    TAKE these medications         acetaminophen 325 MG tablet   Commonly known as: TYLENOL   Take 2 tablets (650 mg total) by mouth every 4 (four) hours as needed (or Fever >/= 101).      ALPRAZolam 0.25 MG tablet   Commonly known as: XANAX   Take 1 tablet (0.25 mg total) by mouth at bedtime as needed for sleep.      aspirin EC 81 MG tablet   Take 81 mg by mouth daily.      diclofenac sodium 1 % Gel   Commonly known as: VOLTAREN   Apply 2 g topically every 6 (six) hours as needed (pain).      DSS 100 MG Caps   Take 100 mg by mouth 2 (two) times daily.      ezetimibe-simvastatin 10-80 MG per tablet   Commonly known as: VYTORIN   Take 1 tablet by mouth at bedtime.      folic acid 1 MG tablet   Commonly known as: FOLVITE   Take 1 mg by mouth daily.      furosemide 40 MG tablet   Commonly known as: LASIX   Take 20-40 mg by mouth daily as needed. Swelling      HYDROcodone-acetaminophen 5-325 MG per tablet   Commonly known as: NORCO/VICODIN   Take 0.5-1 tablets by mouth every 4 (four) hours as needed for pain.      levETIRAcetam 250 MG tablet   Commonly known as: KEPPRA   Take 250 mg by mouth 2  (two) times daily.      levothyroxine 175 MCG tablet   Commonly known as: SYNTHROID, LEVOTHROID   Take 175 mcg by mouth daily.      magnesium hydroxide 400 MG/5ML suspension   Commonly known as: MILK OF MAGNESIA   Take 30 mLs by mouth daily as needed for constipation.      PATADAY OP   Apply 1 drop to eye daily as needed. Itching      potassium chloride 10 MEQ tablet   Commonly known as: K-DUR   Take 1 tablet (10 mEq total) by mouth daily.      Vitamin D (Ergocalciferol) 50000 UNITS Caps   Commonly known as: DRISDOL   Take 50,000 Units by mouth every 7 (seven) days. Usually on Sundays          Discharge Orders    Future Appointments: Provider: Department: Dept Phone: Center:   05/10/2012 10:45 AM Salley Scarlet, MD [facility] [phone] [facility]     Future Orders Please Complete By Expires   Diet - low sodium heart healthy      Walk with assistance  Discharge instructions      Comments:   Keep right hand in splint.  Keep both arms in slings for 2 weeks.  Needs assistance with all meals.  At 2 weeks, start OT for upper extremity strengthening      Follow-up Information    Follow up with Fuller Canada, MD. In 2 weeks.   Contact information:   [address], [place] [address] [phone]          Disposition: SNF  Discharged Condition: stable  Consults: Treatment Team:  Beryle Beams, MD Vickki Hearing, MD  Labs:    Sodium      138      Potassium      3.8      Chloride      106      CO2      23      BUN      14      Creatinine, Ser      0.84      Calcium      9.3      GFR calc non Af Amer      59      GFR calc Af Amer      68       Glucose, Bld      94       CARDIAC PROFILE    Troponin I      <0.30        CBC    WBC      11.9      RBC      4.74      Hemoglobin      13.9      HCT      42.4      MCV      89.5      MCH      29.3      MCHC      32.8      RDW      12.8       Platelets      273       DIFFERENTIAL    Neutrophils Relative      77      Lymphocytes Relative      10      Monocytes Relative      13      Eosinophils Relative      0      Basophils Relative      0      Neutro Abs      9.2      Lymphs Abs      1.2      Monocytes Absolute      1.6      Eosinophils Absolute      0.0      Basophils Absolute      0.0       DIABETES    Glucose, Bld      94       URINALYSIS    Color, Urine      YELLOW      APPearance      HAZY      Specific Gravity, Urine      1.030      pH      5.5      Glucose, UA      NEGATIVE      Bilirubin Urine  NEGATIVE      Ketones, ur      NEGATIVE      Protein, ur      30      Urobilinogen, UA      0.2      Nitrite      NEGATIVE      Leukocytes, UA      MODERATE      Hgb urine dipstick      LARGE      WBC, UA      TOO NUMEROUS TO COUNT      RBC / HPF      TOO NUMEROUS TO COUNT      Squamous Epithelial / LPF      RARE      Bacteria, UA      MANY       Diagnostics:  Dg Chest 1 View  04/23/2012  *RADIOLOGY REPORT*  Clinical Data: Fall  CHEST - 1 VIEW  Comparison: 09/20/2010  Findings: Heart is borderline enlarged.  Vascular congestion without interstitial edema.  Aortic knob is distinct.  No pneumothorax.  No consolidation.  Persistent elevation of the left hemidiaphragm.  No obvious acute bony deformity.  IMPRESSION: Borderline cardiomegaly without edema.   Original Report Authenticated By: Donavan Burnet, M.D.    Dg Shoulder Right  04/23/2012  *RADIOLOGY REPORT*  Clinical Data: Fall  RIGHT SHOULDER - 2+ VIEW  Comparison: None.  Findings: There is lucency extending across the humeral head. Nondisplaced fracture is not excluded.  Normal obvious cortical step off.  There is apparent impaction of the humeral neck into the humeral head associated with a sclerotic band which may also represent a fracture  IMPRESSION: Above findings are worrisome for a nondisplaced and impaction fracture of the humeral neck and head.    Original Report Authenticated By: Donavan Burnet, M.D.    Dg Forearm Right  04/23/2012  *RADIOLOGY REPORT*  Clinical Data: Fall, pain.  RIGHT FOREARM - 2 VIEW  Comparison: None.  Findings: Degenerative changes in the right wrist and right elbow. No acute bony abnormality.  No fracture, subluxation or dislocation.  Soft tissues are intact.  IMPRESSION: No acute bony abnormality.   Original Report Authenticated By: Cyndie Chime, M.D.    Ct Head Wo Contrast  04/23/2012  *RADIOLOGY REPORT*  Clinical Data:  Fall.  Abrasions to forehead.  CT HEAD WITHOUT CONTRAST CT CERVICAL SPINE WITHOUT CONTRAST  Technique:  Multidetector CT imaging of the head and cervical spine was performed following the standard protocol without intravenous contrast.  Multiplanar CT image reconstructions of the cervical spine were also generated.  Comparison:  None.  CT HEAD  Findings: There is atrophy and chronic small vessel disease changes.  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  Old right thalamic lacunar infarct, stable. No acute intracranial abnormality.  Specifically, no hemorrhage, hydrocephalus, mass lesion, acute infarction, or significant intracranial injury.  No acute calvarial abnormality. Visualized paranasal sinuses and mastoids clear.  Orbital soft tissues unremarkable.  IMPRESSION: No acute intracranial abnormality.  CT CERVICAL SPINE  Findings: Diffuse degenerative disc disease and facet disease. Normal alignment.  Prevertebral soft tissues are normal.  No fracture, epidural or paraspinal hematoma.  IMPRESSION: Diffuse degenerative changes.  No acute bony abnormality.   Original Report Authenticated By: Cyndie Chime, M.D.    Ct Cervical Spine Wo Contrast  04/23/2012  *RADIOLOGY REPORT*  Clinical Data:  Fall.  Abrasions to forehead.  CT HEAD WITHOUT CONTRAST CT CERVICAL SPINE WITHOUT  CONTRAST  Technique:  Multidetector CT imaging of the head and cervical spine was performed following the standard protocol without  intravenous contrast.  Multiplanar CT image reconstructions of the cervical spine were also generated.  Comparison:  None.  CT HEAD  Findings: There is atrophy and chronic small vessel disease changes.  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  Old right thalamic lacunar infarct, stable. No acute intracranial abnormality.  Specifically, no hemorrhage, hydrocephalus, mass lesion, acute infarction, or significant intracranial injury.  No acute calvarial abnormality. Visualized paranasal sinuses and mastoids clear.  Orbital soft tissues unremarkable.  IMPRESSION: No acute intracranial abnormality.  CT CERVICAL SPINE  Findings: Diffuse degenerative disc disease and facet disease. Normal alignment.  Prevertebral soft tissues are normal.  No fracture, epidural or paraspinal hematoma.  IMPRESSION: Diffuse degenerative changes.  No acute bony abnormality.   Original Report Authenticated By: Cyndie Chime, M.D.    Dg Shoulder Left  04/23/2012  *RADIOLOGY REPORT*  Clinical Data: Fall  LEFT SHOULDER - 2+ VIEW  Comparison: 12/13/2004  Findings: Minimally displaced fracture involving the articular surface of the lateral humeral head is present.  This likely represents a greater tuberosity fragment.  Degenerative changes are superimposed with prominent inferior AC joint osteophytes. Prominent degenerative change of the glenohumeral joint.  IMPRESSION: Acute minimally displaced fracture of the humeral head.  This likely represents the greater tuberosity.   Original Report Authenticated By: Donavan Burnet, M.D.    Dg Knee Complete 4 Views Right  04/23/2012  *RADIOLOGY REPORT*  Clinical Data: Fall  RIGHT KNEE - COMPLETE 4+ VIEW  Comparison: None.  Findings: Total knee are there is no breakage or loosening of the hardware.  No acute fracture.  No dislocation.  IMPRESSION: Total knee arthroplasty anatomically aligned.  No acute bony pathology.   Original Report Authenticated By: Donavan Burnet, M.D.    Dg Humerus  Left  04/23/2012  *RADIOLOGY REPORT*  Clinical Data: Fall, chest pain, shoulder pain.  LEFT HUMERUS - 2+ VIEW  Comparison: None.  Findings: Advanced degenerative changes in the left shoulder. No acute bony abnormality.  Specifically, no fracture, subluxation, or dislocation.  Soft tissues are intact.  IMPRESSION: No acute bony abnormality.   Original Report Authenticated By: Cyndie Chime, M.D.     Procedures: none  EKG: Normal sinus rhythm Right bundle branch block  Full Code   Hospital Course:  See H&P for complete admission details. Ms. [name] is a 78 year old black female who lives alone and has a history of seizures. She presented after having woken up on the floor near her bed and complaining of bilateral shoulder pain, right greater than left. She does not recall how she came to be on the floor. She did report that she had been off her Keppra for a week, because she misplaced it. In the emergency room, she was noted to have bilateral shoulder pain and decreased range of motion in the shoulders due to pain, right wrist drop. She also had stage I sacral decubitus. Multiple imaging films were done and she was found to have bilateral proximal humerus fractures. Prior to this episode, she had been living alone and gets around with a walker. She likely had a seizure from being off her antiepileptic drugs.  Orthopedics and neurology were consulted. Orthopedics recommended bilateral arm slings and a wrist splint on the right because of the wrist drop. Patient to work with physical therapy and would benefit from skilled nursing facility placement. She will need to followup  with Dr. Romeo Apple in 2 weeks for repeat x-rays and evaluation.  On admission she was found to have a urinary tract infection and has been treated sufficiently in house. She needs no outpatient antibiotics at this time. She did have problems with constipation and received stool softeners and laxatives while here. She is currently  awaiting a skilled nursing facility bed. Total time on the day of discharge greater than 30 minutes.  Discharge Exam:  Blood pressure 143/80, pulse 66, temperature 98 F (36.7 C), temperature source Oral, resp. rate 18, height 5\' 8"  (1.727 m), weight 94.7 kg (208 lb 12.4 oz), SpO2 96.00%.  Gen:  More comfortable, interactive.  Smiling. Lungs:  CTA without WRR Heart: S1, S2, with a soft systolic murmur. Abd: Obese, positive bowel sounds, soft, nontender, nondistended. Ext:  Right hand edematous. Splint on right hand. The patient is able to wiggle her fingers. Mild arthritic changes seen in her left hand. Nontender and no synovitis.  Bilateral slings in place. Left knee abrasion/superficial ulcer with no drainage or surrounding erythema. Sacrum: Stage II sacral ulcer. No surrounding edema or erythema or drainage.     ADDENDUM:  The patient's discharge was postponed until today because of the family's appeal for discharge. Her family wanted the patient to be discharged to a skilled nursing facility closer to home. There appeal was reviewed by CMS. The decision was that the patient would need to be discharged by noon today, otherwise she would assume the cost of further hospitalization. The patient and family were aware of this decision. Apparently, a skilled nursing facility bed has opened up at [facility]. The patient will be discharged there today.  To review: The patient will need to have her bilateral slings in place for a total of 2 weeks or until May 09, 2012. At that time, occupational therapy and physical therapy can start. The right hand splint will need to stay in place until she follows up with Dr. Romeo Apple. She will need to followup with Dr. Romeo Apple, orthopedic doctor in 2 weeks. Recommend x-rays then. Wound care instructions were dictated in the discharge diagnosis section above.  Addendum discharge time: 20 minutes.  Signed: Reyna Lorenzi 04/29/2012, 10:52 AM

## 2012-04-29 NOTE — Progress Notes (Signed)
Report Called to K. Roselyn Bering LPN at [facility]. Pt. Taken to [facility] via EMS.

## 2012-04-29 NOTE — Clinical Social Work Note (Signed)
CSW received call from [facility] reporting authorization has been received and okay to transfer pt. Family requesting transport via [facility]. D/C summary faxed. Pt's daughter [name] notified of authorization and is appreciative.   Derenda Fennel, [place] [phone]

## 2012-04-29 NOTE — Progress Notes (Signed)
Occupational Therapy Treatment Patient Details Name: [name] MRN: [id number] DOB: 03-03-1919 Today's Date: 04/29/2012 Time: 6644-0347 OT Time Calculation (min): 29 min  OT Assessment / Plan / Recommendation Comments on Treatment Session A:  Checked splint which patient said she wore all night.  Patient without any signs of redness or breakdown,    Follow Up Recommendations       Barriers to Discharge       Equipment Recommendations       Recommendations for Other Services    Frequency Min 2X/week   Plan Discharge plan remains appropriate    Precautions / Restrictions Precautions Precautions: Fall Type of Shoulder Precautions: Bilateral proximal humerus fractures, Slings x 2 weeks. AROM of elbow - hand is ok  Required Braces or Orthoses: Other Brace/Splint Other Brace/Splint: fabricated resting hand splint Restrictions Weight Bearing Restrictions: Yes RUE Weight Bearing: Non weight bearing (requires cues not to use BUE when getting out of bed) LUE Weight Bearing: Non weight bearing (requires ques to not bear when when getting out of bed)   Pertinent Vitals/Pain     ADL  Grooming: Performed;Wash/dry face;Set up Where Assessed - Grooming: Supported sitting Toilet Transfer: Performed;Minimal Dentist Method: Sit to stand;Stand pivot Acupuncturist: Materials engineer and Hygiene: Performed;+1 Total assistance Where Assessed - Engineer, mining and Hygiene: Sit to stand from 3-in-1 or toilet Equipment Used: Gait belt;Other (comment) (bilateral slings)    OT Diagnosis:    OT Problem List:   OT Treatment Interventions:     OT Goals ADL Goals ADL Goal: Eating - Progress: Progressing toward goals Arm Goals Arm Goal: AROM - Progress: Progressing toward goal Miscellaneous OT Goals OT Goal: Miscellaneous Goal #1 - Progress: Met OT Goal: Miscellaneous Goal #2 - Progress: Met  Visit  Information  Last OT Received On: 04/29/12 Assistance Needed: +1    Subjective Data  Subjective: S:  I am so tired.   Prior Functioning       Cognition  Overall Cognitive Status: Appears within functional limits for tasks assessed/performed Arousal/Alertness: Awake/alert Orientation Level: Appears intact for tasks assessed Behavior During Session: [facility] for tasks performed    Mobility  Shoulder Instructions Bed Mobility Bed Mobility: Rolling Left;Right Sidelying to Sit;Sitting - Scoot to [place] of Bed Rolling Left: 4: Min assist Right Sidelying to Sit: 3: Mod assist Supine to Sit: 4: Min assist Sitting - Scoot to Edge of Bed: 3: Mod assist Sit to Supine: Not Tested (comment)   Donning/doffing sling/immobilizer: Moderate assistance (ed. on correct positioning while in sling.) Correct positioning of sling/immobilizer: Moderate assistance Sling wearing schedule (on at all times/off for ADL's): Supervision/safety   Exercises  General Exercises - Upper Extremity Elbow Flexion: AROM;15 reps;Both Elbow Extension: AROM;15 reps;Both Wrist Flexion: AROM;15 reps;Both Wrist Extension: AROM;10 reps;Left Digit Composite Flexion: PROM;AAROM;Right;15 reps   Balance     End of Session OT - End of Session Equipment Utilized During Treatment: Gait belt Activity Tolerance: Patient tolerated treatment well Patient left: in chair;with call bell/phone within reach;with chair alarm set;with family/visitor present  GO    Fateh Kindle L. Keatyn Luck, COTA/L  04/29/2012, 1:27 PM

## 2012-04-30 NOTE — Care Management Note (Unsigned)
    Page 1 of 1   04/30/2012     10:32:03 AM   CARE MANAGEMENT NOTE 04/30/2012  Patient:  [name], [name]   Account Number:  [id number]  Date Initiated:  04/26/2012  Documentation initiated by:  Rosemary Holms  Subjective/Objective Assessment:   Pt admitted from home after falling and breaking both arms.     Action/Plan:   Pt DC'd to SNF but unable to get an authorization from [facility] to DC pt to [facility] in [place]. CSW able to get approval fast tracked to [facility] from [facility] but daughters refusing to allow mother to DC due to the distance from family mem..   Anticipated DC Date:  04/29/2012   Anticipated DC Plan:  SKILLED NURSING FACILITY  In-house referral  Clinical Social Worker         Choice offered to / List presented to:             Status of service:  Completed, signed off Medicare Important Message given?  YES (If response is "NO", the following Medicare IM given date fields will be blank) Date Medicare IM given:  04/26/2012 Date Additional Medicare IM given:    Discharge Disposition:  SKILLED NURSING FACILITY  Per UR Regulation:    If discussed at Long Length of Stay Meetings, dates discussed:    Comments:  04/29/12 0900 Anibal Henderson RN Pt, and family in the room, daughter, son and son-in- Social worker were given HINN, per medicare/QIO instructions,  and spoke with Rosey Bath by phone. IM & DND faxed to [facility] per request of Blythe Stanford CM at [facility] 04/26/12 1630 Delayed entry 04/30/12 1020 Careli Luzader RN IM given and explained  to daughter in room per patient's permission, that if refusing to go to [facility], they had to take pt home, or if not, must appeal D/C as IM describes. Also explained to  daughter, Rosey Bath, by phone 04/26/12 1630 Amy Robson RN BSN CM IM given and explained in detail to two daughters. CM advised AC of situation and to pick up IM from room. Anticipate appeal to [employer].

## 2012-05-09 ENCOUNTER — Telehealth: Payer: Self-pay | Admitting: Orthopedic Surgery

## 2012-05-09 NOTE — Telephone Encounter (Signed)
Per Tonny Bollman, contact person at [facility] where patient is a resident, patient's appointment needs to be re-scheduled due to transportation.  Aware to have Xray prior to the new appointment, 05/20/12, 1:45pm.  Order needed.  Please fax to facility at [phone]. (If fax copy needed at [facility], location where patient will be taken for Xray, radiology fax [phone].

## 2012-05-10 ENCOUNTER — Ambulatory Visit: Payer: Medicare Other | Admitting: Family Medicine

## 2012-05-15 ENCOUNTER — Ambulatory Visit: Payer: Medicare PPO | Admitting: Orthopedic Surgery

## 2012-05-20 ENCOUNTER — Ambulatory Visit (INDEPENDENT_AMBULATORY_CARE_PROVIDER_SITE_OTHER): Payer: Medicare PPO | Admitting: Orthopedic Surgery

## 2012-05-20 ENCOUNTER — Encounter: Payer: Self-pay | Admitting: Orthopedic Surgery

## 2012-05-20 VITALS — BP 110/64 | Ht 68.0 in | Wt 208.0 lb

## 2012-05-20 DIAGNOSIS — S42302A Unspecified fracture of shaft of humerus, left arm, initial encounter for closed fracture: Secondary | ICD-10-CM

## 2012-05-20 DIAGNOSIS — S42309A Unspecified fracture of shaft of humerus, unspecified arm, initial encounter for closed fracture: Secondary | ICD-10-CM

## 2012-05-20 DIAGNOSIS — G5630 Lesion of radial nerve, unspecified upper limb: Secondary | ICD-10-CM

## 2012-05-20 DIAGNOSIS — S42301A Unspecified fracture of shaft of humerus, right arm, initial encounter for closed fracture: Secondary | ICD-10-CM

## 2012-05-20 NOTE — Progress Notes (Signed)
Patient ID: [name], female   DOB: Feb 15, 1919, 78 y.o.   MRN: [id number] Chief Complaint  Patient presents with  . Follow-up    2 week recheck on bilateral proximal humeral fractures.    This patient is a right and left proximal humerus fracture and a right radial nerve palsy after falling out of bed secondary to a seizure and laying on her right arm for some unknown amount of time she is in a functional position splint on the right hand and bilateral shoulder immobilizers  X-rays were done at [facility] as a followup and they show fracture healing in both proximal humeri with chronic rotator cuff disease changes in the bone  She still has no active extension of the metacarpophalangeal joint or the wrist, no active extension of the thumb  She can remove the sling and start physical therapy on the shoulders and continue with splinting of the wrist and passive range of motion of the hand and the fingers as well as the wrist.  Followup in 4 weeks  She is not expected to need nursing home care for 6 months and should be home prior to that

## 2012-06-10 ENCOUNTER — Telehealth: Payer: Self-pay | Admitting: Orthopedic Surgery

## 2012-06-10 NOTE — Telephone Encounter (Signed)
Appropriate response no other response need

## 2012-06-10 NOTE — Telephone Encounter (Signed)
Patient called from her cell ph# [phone] from [facility] facility (direct ph# [phone])) stating she would like to go home.  States she has some help at home, although daughter in background said she does not have help at home.  Patient's next appointment is 06/18/12, which I confirmed, and relayed that Dr. Romeo Apple will be re-evaluating her regarding her status at that.  Please advise if any other recommendations regarding potential discharge to home.

## 2012-06-18 ENCOUNTER — Encounter: Payer: Self-pay | Admitting: Orthopedic Surgery

## 2012-06-18 ENCOUNTER — Ambulatory Visit (INDEPENDENT_AMBULATORY_CARE_PROVIDER_SITE_OTHER): Payer: Medicare PPO | Admitting: Orthopedic Surgery

## 2012-06-18 VITALS — Ht 68.0 in | Wt 208.0 lb

## 2012-06-18 DIAGNOSIS — S42209A Unspecified fracture of upper end of unspecified humerus, initial encounter for closed fracture: Secondary | ICD-10-CM

## 2012-06-18 DIAGNOSIS — S42202A Unspecified fracture of upper end of left humerus, initial encounter for closed fracture: Secondary | ICD-10-CM

## 2012-06-18 DIAGNOSIS — S42201A Unspecified fracture of upper end of right humerus, initial encounter for closed fracture: Secondary | ICD-10-CM

## 2012-06-18 DIAGNOSIS — G5630 Lesion of radial nerve, unspecified upper limb: Secondary | ICD-10-CM

## 2012-06-18 NOTE — Progress Notes (Signed)
Patient ID: [name], female   DOB: 08-08-1919, 78 y.o.   MRN: [id number] Chief Complaint  Patient presents with  . Follow-up    4 week recheck ROM.    1. Closed fracture of right proximal humerus   2. Closed fracture of left proximal humerus   3. Radial nerve dysfunction     Status post closed fractures as noted with radial nerve palsy  She's currently in a cock-up splint she is at a [facility]  She is making excellent progress. She should be ready to discharge in about 2 more weeks. Hopefully she can get home health to help her with bathing activity to daily living cooking and dressing  She has excellent passive range of motion in the left upper extremity poor passive range of motion in the right upper extremity passive wrist extension tight and contracted IP joints and MP joints  Recommend continued therapy discharge in 2 weeks followup in one month.  Ordered nerve conduction study to evaluate radial nerve

## 2012-07-04 ENCOUNTER — Telehealth: Payer: Self-pay | Admitting: Family Medicine

## 2012-07-04 NOTE — Telephone Encounter (Signed)
rx faxed

## 2012-07-04 NOTE — Telephone Encounter (Signed)
Please fax script to number below

## 2012-07-08 ENCOUNTER — Ambulatory Visit (INDEPENDENT_AMBULATORY_CARE_PROVIDER_SITE_OTHER): Payer: Medicare PPO | Admitting: Family Medicine

## 2012-07-08 ENCOUNTER — Ambulatory Visit (HOSPITAL_COMMUNITY)
Admission: RE | Admit: 2012-07-08 | Discharge: 2012-07-08 | Disposition: A | Discharge status: Home / Self Care | Payer: Medicare PPO | Source: Ambulatory Visit | Attending: Family Medicine | Admitting: Family Medicine

## 2012-07-08 ENCOUNTER — Encounter: Payer: Self-pay | Admitting: Family Medicine

## 2012-07-08 VITALS — BP 120/70 | HR 84 | Resp 15 | Ht 68.0 in | Wt 208.8 lb

## 2012-07-08 DIAGNOSIS — R609 Edema, unspecified: Secondary | ICD-10-CM

## 2012-07-08 DIAGNOSIS — M79609 Pain in unspecified limb: Secondary | ICD-10-CM | POA: Insufficient documentation

## 2012-07-08 DIAGNOSIS — M19019 Primary osteoarthritis, unspecified shoulder: Secondary | ICD-10-CM | POA: Insufficient documentation

## 2012-07-08 DIAGNOSIS — S42309A Unspecified fracture of shaft of humerus, unspecified arm, initial encounter for closed fracture: Secondary | ICD-10-CM

## 2012-07-08 DIAGNOSIS — M25519 Pain in unspecified shoulder: Secondary | ICD-10-CM | POA: Insufficient documentation

## 2012-07-08 DIAGNOSIS — S42301A Unspecified fracture of shaft of humerus, right arm, initial encounter for closed fracture: Secondary | ICD-10-CM

## 2012-07-08 DIAGNOSIS — R569 Unspecified convulsions: Secondary | ICD-10-CM

## 2012-07-08 DIAGNOSIS — I10 Essential (primary) hypertension: Secondary | ICD-10-CM

## 2012-07-08 MED ORDER — TORSEMIDE 20 MG PO TABS: 20.0000 mg | ORAL_TABLET | Freq: Every day | ORAL | Status: DC

## 2012-07-08 MED ORDER — LEVOTHYROXINE SODIUM 175 MCG PO TABS: 175.0000 ug | ORAL_TABLET | Freq: Every day | ORAL | Status: DC

## 2012-07-08 MED ORDER — VITAMIN D (ERGOCALCIFEROL) 1.25 MG (50000 UNIT) PO CAPS: 50000.0000 [IU] | ORAL_CAPSULE | ORAL | Status: DC

## 2012-07-08 MED ORDER — HYDROCODONE-ACETAMINOPHEN 5-325 MG PO TABS: 1.0000 | ORAL_TABLET | Freq: Four times a day (QID) | ORAL | Status: DC | PRN

## 2012-07-08 MED ORDER — POTASSIUM CHLORIDE ER 10 MEQ PO TBCR: 10.0000 meq | EXTENDED_RELEASE_TABLET | Freq: Every day | ORAL | Status: DC

## 2012-07-08 MED ORDER — FOLIC ACID 1 MG PO TABS: 1.0000 mg | ORAL_TABLET | Freq: Every day | ORAL | Status: DC

## 2012-07-08 MED ORDER — DICLOFENAC SODIUM 1 % TD GEL: 2.0000 g | Freq: Four times a day (QID) | TRANSDERMAL | Status: DC | PRN

## 2012-07-08 MED ORDER — ALPRAZOLAM 0.25 MG PO TABS: 0.2500 mg | ORAL_TABLET | Freq: Every evening | ORAL | Status: DC | PRN

## 2012-07-08 MED ORDER — LEVETIRACETAM 250 MG PO TABS: 250.0000 mg | ORAL_TABLET | Freq: Two times a day (BID) | ORAL | Status: DC

## 2012-07-08 NOTE — Assessment & Plan Note (Signed)
Evaluated by neuro, still on keppra

## 2012-07-08 NOTE — Assessment & Plan Note (Signed)
Change to demadex

## 2012-07-08 NOTE — Progress Notes (Signed)
  Subjective:    Patient ID: [name], female    DOB: 12-27-1918, 78 y.o.   MRN: [id number]  HPI Pt presents to f/u recent admission to SNF after seizure resulted in fall from bed and bilateral humerus fractures. She has been seen by ortho and discharged from SNF to start home PT. Since she has been staying with her daughter ( note she is not happy about loosing independence) she has been trying to lift with her right arm/shoulder and now she has significant swelling over shoulder. Using pain medications every 4 hours Seen by neuro today for nerve conduction studies No further seizure activity on Keppra  Medication reconciliation done, asking to be changed to demadex, lasix does not help [facility]   Review of Systems  GEN- denies fatigue, fever, weight loss,weakness, recent illness HEENT- denies eye drainage, change in vision, nasal discharge, CVS- denies chest pain, palpitations RESP- denies SOB, cough, wheeze ABD- denies N/V, change in stools, abd pain GU- denies dysuria, hematuria, dribbling, incontinence MSK-  joint pain, muscle aches, injury Neuro- denies headache, dizziness, syncope, seizure activity      Objective:   Physical Exam GEN- NAD, alert and oriented x3, ambulates with walker with arm rest for right side  HEENT- PERRL, EOMI, non injected sclera, pink conjunctiva, MMM, oropharynx clear Neck- Supple,  CVS- RRR, no murmur RESP-CTAB EXT- 1+edema LE, + edema right hand  Pulses- Radial, DP- 2+ MSK- Decreased ROM bilateral shoulder R>L, + swelling over deltoid and top of humerus on right side, TTP Neuro- CNII-XII in tact grossly, no new focal deficits       Assessment & Plan:

## 2012-07-08 NOTE — Patient Instructions (Addendum)
Use the Voltaren gel  Get the xray done of your shoulder/arm  We will have HH set up - [facility]  Medications to be refilled  F/U 3 months

## 2012-07-08 NOTE — Assessment & Plan Note (Signed)
Signigicant swelling of shoulder xrays obtained which shows severe OA and healed humeral fractures Will discuss with ortho, given voltaren gel, refilled pain meds Advised to stop her uncessisary activity and follow with PT

## 2012-07-08 NOTE — Assessment & Plan Note (Signed)
Blood pressure looks good today, no change to meds 

## 2012-07-09 ENCOUNTER — Ambulatory Visit: Payer: Medicare PPO | Admitting: Family Medicine

## 2012-07-17 ENCOUNTER — Encounter: Payer: Self-pay | Admitting: Orthopedic Surgery

## 2012-07-17 ENCOUNTER — Ambulatory Visit (INDEPENDENT_AMBULATORY_CARE_PROVIDER_SITE_OTHER): Payer: Medicare PPO | Admitting: Orthopedic Surgery

## 2012-07-17 DIAGNOSIS — G5630 Lesion of radial nerve, unspecified upper limb: Secondary | ICD-10-CM

## 2012-07-17 DIAGNOSIS — S42209A Unspecified fracture of upper end of unspecified humerus, initial encounter for closed fracture: Secondary | ICD-10-CM

## 2012-07-17 DIAGNOSIS — S42202A Unspecified fracture of upper end of left humerus, initial encounter for closed fracture: Secondary | ICD-10-CM

## 2012-07-17 DIAGNOSIS — S42201A Unspecified fracture of upper end of right humerus, initial encounter for closed fracture: Secondary | ICD-10-CM

## 2012-07-17 NOTE — Progress Notes (Signed)
Patient ID: [name], female   DOB: 05-Jun-1919, 78 y.o.   MRN: [id number] Chief Complaint  Patient presents with  . Follow-up    4 week recheck with NCS results. bilateral proximal humerus fractures 04-23-2012    Patient has returned to her daughter's house, but she really wants to go to her house. She is very angry and very depressed.  She has not been compliant with her RIGHT upper extremity splint.  The nerve conduction study was completed. It was inconclusive.  She continues with the RIGHT wrist drop.  Recommend functional RIGHT upper extremity wrist splint, metacarpal, 70 wrist flexion, 30  Recommendation return home with home health and occupation and physical therapy at home.  Recommend patient's stay with her family until Monday.

## 2012-07-17 NOTE — Patient Instructions (Addendum)
Continue therapy on hand and wrist  Wrist splint right upper extremity

## 2012-08-01 ENCOUNTER — Encounter: Payer: Self-pay | Admitting: Family Medicine

## 2012-08-08 ENCOUNTER — Telehealth: Payer: Self-pay | Admitting: Family Medicine

## 2012-08-08 NOTE — Telephone Encounter (Signed)
They can use vapor rub, or robitussin for cough, there is not a lot for common cold Rest and plenty of fluids

## 2012-08-08 NOTE — Telephone Encounter (Signed)
C/o runny nose (clear) and a lot of sneezing. No fever and just a little cough. Wants to know what she can take.  Please advise

## 2012-08-08 NOTE — Telephone Encounter (Signed)
Patient aware.

## 2012-09-11 ENCOUNTER — Ambulatory Visit: Payer: Medicare PPO | Admitting: Orthopedic Surgery

## 2012-09-17 ENCOUNTER — Ambulatory Visit (INDEPENDENT_AMBULATORY_CARE_PROVIDER_SITE_OTHER): Payer: Medicare PPO | Admitting: Orthopedic Surgery

## 2012-09-17 VITALS — BP 142/72 | Ht 68.0 in | Wt 208.0 lb

## 2012-09-17 DIAGNOSIS — G5630 Lesion of radial nerve, unspecified upper limb: Secondary | ICD-10-CM

## 2012-09-17 NOTE — Patient Instructions (Signed)
Continue home therapy and night splinting

## 2012-09-17 NOTE — Progress Notes (Signed)
Patient ID: [name], female   DOB: [birth year], 79 y.o.   MRN: [id number] Chief Complaint  Patient presents with  . Follow-up    right upper extremity follow up    Status post bilateral proximal humerus fractures with radial nerve palsy secondary to lying on her right arm until she was found date of injury was September she's make good progress she is now at home with a CNA assist 3 hours per day she got a platform walker  She wears a night splint she gets home occupational therapy. She can now flex and extend her elbow she can extend her wrist which is major improvement she's still having trouble flexing the metacarpophalangeal joints. Week and continue therapy to help with this  I'll see her again in 3 months

## 2012-10-07 ENCOUNTER — Encounter: Payer: Self-pay | Admitting: Family Medicine

## 2012-10-07 ENCOUNTER — Ambulatory Visit (INDEPENDENT_AMBULATORY_CARE_PROVIDER_SITE_OTHER): Payer: Medicare PPO | Admitting: Family Medicine

## 2012-10-07 VITALS — BP 120/72 | HR 100 | Resp 18 | Ht 68.0 in | Wt 180.0 lb

## 2012-10-07 DIAGNOSIS — S99922A Unspecified injury of left foot, initial encounter: Secondary | ICD-10-CM

## 2012-10-07 DIAGNOSIS — R569 Unspecified convulsions: Secondary | ICD-10-CM

## 2012-10-07 DIAGNOSIS — I10 Essential (primary) hypertension: Secondary | ICD-10-CM

## 2012-10-07 DIAGNOSIS — F411 Generalized anxiety disorder: Secondary | ICD-10-CM

## 2012-10-07 DIAGNOSIS — S8990XA Unspecified injury of unspecified lower leg, initial encounter: Secondary | ICD-10-CM

## 2012-10-07 DIAGNOSIS — IMO0002 Reserved for concepts with insufficient information to code with codable children: Secondary | ICD-10-CM

## 2012-10-07 DIAGNOSIS — E039 Hypothyroidism, unspecified: Secondary | ICD-10-CM

## 2012-10-07 DIAGNOSIS — I2510 Atherosclerotic heart disease of native coronary artery without angina pectoris: Secondary | ICD-10-CM

## 2012-10-07 NOTE — Patient Instructions (Addendum)
Use your splint at bedtime Soak the toe in Epsom salt and keep covered  I will refill Voltaren gel  Pataday once a day  F/U 4 months

## 2012-10-07 NOTE — Progress Notes (Signed)
  Subjective:    Patient ID: [name], female    DOB: Jul 21, 1919, 79 y.o.   MRN: [id number]  HPI  Patient presents to follow chronic medical problems. Today she stubbed her toe in the kitchen and noticed it was bleeding this was seen by her insurance nurse and this was wrapped.. She continues to take her medications. She actually moved back home in November after she states her protests against her family she's been doing well at home has been preparing her meals a day she typically eats a lot of foods with sweets because her taste buds have change in food does not taste right to her.  Review of Systems  GEN- denies fatigue, fever, weight loss,weakness, recent illness HEENT- denies eye drainage, change in vision, nasal discharge, CVS- denies chest pain, palpitations RESP- denies SOB, cough, wheeze ABD- denies N/V, change in stools, abd pain GU- denies dysuria, hematuria, dribbling, incontinence MSK- +joint pain, muscle aches, injury Neuro- denies headache, dizziness, syncope, seizure activity      Objective:   Physical Exam GEN- NAD, alert and oriented x3, ambulates with walker with arm rest for right side  HEENT- PERRL, EOMI, non injected sclera, pink conjunctiva, MMM, oropharynx clear Neck- Supple,  CVS- RRR, no murmur RESP-CTAB EXT- 1+edema LE, + decreased strength right hand, weakness right hand with grip, left great toe- small laceration now closed - no bleeding, mild TTP, no pus noted Pulses- Radial, DP- 2+       Assessment & Plan:

## 2012-10-08 DIAGNOSIS — S99929A Unspecified injury of unspecified foot, initial encounter: Secondary | ICD-10-CM | POA: Insufficient documentation

## 2012-10-08 MED ORDER — DICLOFENAC SODIUM 1 % TD GEL: 2.0000 g | Freq: Four times a day (QID) | TRANSDERMAL | Status: DC | PRN

## 2012-10-08 MED ORDER — OLOPATADINE HCL 0.2 % OP SOLN: OPHTHALMIC | Status: DC

## 2012-10-08 NOTE — Assessment & Plan Note (Signed)
Will put her back on full tear [place] jail. She rarely takes the hydrocodone which was given by orthopedics

## 2012-10-08 NOTE — Assessment & Plan Note (Signed)
Continue Synthroid °

## 2012-10-08 NOTE — Assessment & Plan Note (Signed)
Well-controlled no change the medication 

## 2012-10-08 NOTE — Assessment & Plan Note (Signed)
She stubbed her toe this morning the small laceration is already sealed over her whole have her do Epsom salt for the swelling and keep clean

## 2012-10-08 NOTE — Assessment & Plan Note (Signed)
She is doing well. We discussed the fact that she is on her cholesterol medication she has no difficulty taking medications and nothing is bothering her at this time so we will go ahead and continue

## 2012-10-08 NOTE — Assessment & Plan Note (Signed)
No recent seizure activity she is doing well on Keppra

## 2012-10-08 NOTE — Assessment & Plan Note (Signed)
She has Xanax to take however has not even had refilled that she very very rarely uses this medication

## 2012-10-25 LAB — CBC
Hemoglobin: 12.2 g/dL (ref 12.0–15.0)
MCH: 28.4 pg (ref 26.0–34.0)
MCHC: 32.7 g/dL (ref 30.0–36.0)
MCV: 86.7 fL (ref 78.0–100.0)

## 2012-10-25 LAB — BASIC METABOLIC PANEL
BUN: 17 mg/dL (ref 6–23)
Calcium: 9.8 mg/dL (ref 8.4–10.5)
Creat: 0.9 mg/dL (ref 0.50–1.10)
Glucose, Bld: 81 mg/dL (ref 70–99)
Sodium: 141 mEq/L (ref 135–145)

## 2012-10-25 LAB — LIPID PANEL
HDL: 60 mg/dL (ref >39)
LDL Cholesterol: 77 mg/dL (ref 0–99)
Triglycerides: 66 mg/dL (ref <150)

## 2012-10-25 LAB — HEPATIC FUNCTION PANEL
Albumin: 3.9 g/dL (ref 3.5–5.2)
Alkaline Phosphatase: 81 U/L (ref 39–117)
Total Bilirubin: 0.7 mg/dL (ref 0.3–1.2)

## 2012-10-26 ENCOUNTER — Emergency Department (HOSPITAL_COMMUNITY)
Admission: EM | Admit: 2012-10-26 | Discharge: 2012-10-26 | Disposition: A | Discharge status: Home / Self Care | Payer: Medicare PPO | Attending: Emergency Medicine | Admitting: Emergency Medicine

## 2012-10-26 ENCOUNTER — Encounter (HOSPITAL_COMMUNITY): Payer: Self-pay | Admitting: *Deleted

## 2012-10-26 DIAGNOSIS — Z8739 Personal history of other diseases of the musculoskeletal system and connective tissue: Secondary | ICD-10-CM | POA: Insufficient documentation

## 2012-10-26 DIAGNOSIS — E669 Obesity, unspecified: Secondary | ICD-10-CM | POA: Insufficient documentation

## 2012-10-26 DIAGNOSIS — Z8679 Personal history of other diseases of the circulatory system: Secondary | ICD-10-CM | POA: Insufficient documentation

## 2012-10-26 DIAGNOSIS — Z87448 Personal history of other diseases of urinary system: Secondary | ICD-10-CM | POA: Insufficient documentation

## 2012-10-26 DIAGNOSIS — E039 Hypothyroidism, unspecified: Secondary | ICD-10-CM | POA: Insufficient documentation

## 2012-10-26 DIAGNOSIS — Z7982 Long term (current) use of aspirin: Secondary | ICD-10-CM | POA: Insufficient documentation

## 2012-10-26 DIAGNOSIS — I2510 Atherosclerotic heart disease of native coronary artery without angina pectoris: Secondary | ICD-10-CM | POA: Insufficient documentation

## 2012-10-26 DIAGNOSIS — F17200 Nicotine dependence, unspecified, uncomplicated: Secondary | ICD-10-CM | POA: Insufficient documentation

## 2012-10-26 DIAGNOSIS — Z872 Personal history of diseases of the skin and subcutaneous tissue: Secondary | ICD-10-CM | POA: Insufficient documentation

## 2012-10-26 DIAGNOSIS — Z8719 Personal history of other diseases of the digestive system: Secondary | ICD-10-CM | POA: Insufficient documentation

## 2012-10-26 DIAGNOSIS — F3289 Other specified depressive episodes: Secondary | ICD-10-CM | POA: Insufficient documentation

## 2012-10-26 DIAGNOSIS — I252 Old myocardial infarction: Secondary | ICD-10-CM | POA: Insufficient documentation

## 2012-10-26 DIAGNOSIS — Z8709 Personal history of other diseases of the respiratory system: Secondary | ICD-10-CM | POA: Insufficient documentation

## 2012-10-26 DIAGNOSIS — G40909 Epilepsy, unspecified, not intractable, without status epilepticus: Secondary | ICD-10-CM | POA: Insufficient documentation

## 2012-10-26 DIAGNOSIS — J4489 Other specified chronic obstructive pulmonary disease: Secondary | ICD-10-CM | POA: Insufficient documentation

## 2012-10-26 DIAGNOSIS — F411 Generalized anxiety disorder: Secondary | ICD-10-CM | POA: Insufficient documentation

## 2012-10-26 DIAGNOSIS — Z79899 Other long term (current) drug therapy: Secondary | ICD-10-CM | POA: Insufficient documentation

## 2012-10-26 DIAGNOSIS — R011 Cardiac murmur, unspecified: Secondary | ICD-10-CM | POA: Insufficient documentation

## 2012-10-26 DIAGNOSIS — H9209 Otalgia, unspecified ear: Secondary | ICD-10-CM | POA: Insufficient documentation

## 2012-10-26 MED ORDER — NEOMYCIN-POLYMYXIN-HC 3.5-10000-1 OT SUSP: 4.0000 [drp] | Freq: Three times a day (TID) | OTIC | Status: DC

## 2012-10-26 NOTE — ED Notes (Signed)
Cleaning out right ear with Q-tip x 2 wks ago.  States is having pain since.  States daughter seen some white pieces out of ear today.  Pt also admits to sticking a bobbipen in right ear to remove white pieces.

## 2012-10-26 NOTE — ED Provider Notes (Signed)
History     CSN: [id number]  Arrival date & time 10/26/12  1702   First MD Initiated Contact with Patient 10/26/12 1816      Chief Complaint  Patient presents with  . Otalgia    (Consider location/radiation/quality/duration/timing/severity/associated sxs/prior treatment) HPI  The patient presents with 2 weeks of right ear pain.  Onset was soon after using a Q-tip to clean the ear.  Since that time she has had pain persistently, with no auditory changes, no loss of hearing capacity.  The pain is diffusely radiating up into the ear.  It is sore.  The pain has become worse intermittently with any additional attempts to remove pieces of the Q-tip.  The pain is minimally improved with OTC medication.  No new headache, chills, fever, chest pain.  The patient has baseline dyspnea, which she states is unchanged.    Past Medical History  Diagnosis Date  . Myocardial infarction     history of  . Coronary artery disease   . Leg edema     left  . Aneurysm     hx of  . COPD (chronic obstructive pulmonary disease)   . Hypertension   . Pulmonary nodule   . Obesity   . Anxiety   . Depression   . Hemoptysis   . Pressure ulcer of back     lower  . Tobacco abuse   . DJD (degenerative joint disease)   . Panic disorder   . Urinary incontinence   . Seizure disorder   . Ganglion cyst     left wrist  . Hypothyroidism   . Leg edema   . DDD (degenerative disc disease), lumbosacral     Past Surgical History  Procedure Laterality Date  . Appendectomy    . Cholecystectomy    . Replacement total knee  11/2000    right   . Angioplasty      times 2    No family history on file.  History  Substance Use Topics  . Smoking status: Current Every Day Smoker    Types: Cigarettes  . Smokeless tobacco: Never Used  . Alcohol Use: No    OB History   Grav Para Term Preterm Abortions TAB SAB Ect Mult Living                  Review of Systems  All other systems reviewed and are  negative.    Allergies  Review of patient's allergies indicates no known allergies.  Home Medications   Current Outpatient Rx  Name  Route  Sig  Dispense  Refill  . ALPRAZolam (XANAX) 0.25 MG tablet   Oral   Take 1 tablet (0.25 mg total) by mouth at bedtime as needed for sleep.   30 tablet   2   . aspirin EC 81 MG tablet   Oral   Take 81 mg by mouth daily.         Marland Kitchen ezetimibe-simvastatin (VYTORIN) 10-80 MG per tablet   Oral   Take 1 tablet by mouth at bedtime.           . folic acid (FOLVITE) 1 MG tablet   Oral   Take 1 tablet (1 mg total) by mouth daily.   30 tablet   4   . levETIRAcetam (KEPPRA) 250 MG tablet   Oral   Take 1 tablet (250 mg total) by mouth 2 (two) times daily.   60 tablet   4   . levothyroxine (SYNTHROID, LEVOTHROID)  175 MCG tablet   Oral   Take 1 tablet (175 mcg total) by mouth daily.   30 tablet   4   . Olopatadine HCl 0.2 % SOLN   Both Eyes   Place 1 drop into both eyes daily as needed. 1 drop each eye daily as needed(dry eyes)         . potassium chloride (K-DUR) 10 MEQ tablet   Oral   Take 1 tablet (10 mEq total) by mouth daily.   30 tablet   4   . torsemide (DEMADEX) 20 MG tablet   Oral   Take 1 tablet (20 mg total) by mouth daily. For leg swelling, take with potassium   30 tablet   3   . acetaminophen (TYLENOL) 325 MG tablet   Oral   Take 2 tablets (650 mg total) by mouth every 4 (four) hours as needed (or Fever >/= 101).         Marland Kitchen diclofenac sodium (VOLTAREN) 1 % GEL   Topical   Apply 2 g topically every 6 (six) hours as needed (pain).   100 g   3   . HYDROcodone-acetaminophen (NORCO/VICODIN) 5-325 MG per tablet   Oral   Take 1 tablet by mouth every 6 (six) hours as needed for pain.         Marland Kitchen neomycin-polymyxin-hydrocortisone (CORTISPORIN) 3.5-10000-1 otic suspension   Otic   Place 4 drops in ear(s) 3 (three) times daily.   10 mL   0     BP 117/83  Pulse 114  Temp(Src) 98.5 F (36.9 C) (Oral)   Resp 18  Ht 5\' 8"  (1.727 m)  Wt 188 lb (85.276 kg)  BMI 28.59 kg/m2  SpO2 99%  Physical Exam  Nursing note and vitals reviewed. Constitutional: She is oriented to person, place, and time. She appears well-developed and well-nourished. No distress.  HENT:  Head: Normocephalic and atraumatic.  The right tympanic membrane is visible, not injected, not perforated.  There are scattered bits of white material within the most adjacent area of the auditory canal.  There is mild injected skin around the foreign bodies.  No discharge, no bleeding.  Eyes: Conjunctivae and EOM are normal.  Cardiovascular: Normal rate and regular rhythm.   Murmur heard. Pulmonary/Chest: Effort normal and breath sounds normal. No stridor. No respiratory distress.  Abdominal: She exhibits no distension.  Musculoskeletal: She exhibits no edema.  Neurological: She is alert and oriented to person, place, and time. No cranial nerve deficit.  Skin: Skin is warm and dry.  Psychiatric: She has a normal mood and affect.    ED Course  Procedures (including critical care time)  Labs Reviewed - No data to display No results found.   1. Otalgia of right ear       MDM  This elderly female presents with ongoing ear pain following likely irritation with a Q-tip him and with evidence of retained foreign body on exam.  There is no evidence of perforation, nor infection, and absent fever the patient was not started on systemic antibiotics.  She received topical medication, ENT followup as needed.   Gerhard Munch, MD 10/26/12 [phone]

## 2012-10-28 MED ORDER — LEVOTHYROXINE SODIUM 150 MCG PO TABS: 150.0000 ug | ORAL_TABLET | Freq: Every day | ORAL | Status: DC

## 2012-10-28 NOTE — Addendum Note (Signed)
Addended by: Milinda Antis F on: 10/28/2012 12:04 PM   Modules accepted: Orders

## 2012-10-30 NOTE — ED Notes (Signed)
10/30/2012 Talked with patient.  Unable to go to [facility] to take Rx. Permission from Dr. Bebe Shaggy to call in Rx Cortisporin otic suspension (3.5-10000) four drops to affected ear three times per day to be delivered to her at [address] [place].  Patient advised that if she is getting worse today to return to ED or see her doctor or return to ED tomorrow if not getting better.  Ree Shay, RN

## 2012-10-31 ENCOUNTER — Encounter (HOSPITAL_COMMUNITY): Payer: Self-pay | Admitting: *Deleted

## 2012-10-31 ENCOUNTER — Emergency Department (HOSPITAL_COMMUNITY): Payer: Medicare PPO

## 2012-10-31 ENCOUNTER — Emergency Department (HOSPITAL_COMMUNITY)
Admission: EM | Admit: 2012-10-31 | Discharge: 2012-10-31 | Disposition: A | Discharge status: Home / Self Care | Payer: Medicare PPO | Attending: Emergency Medicine | Admitting: Emergency Medicine

## 2012-10-31 DIAGNOSIS — Z8679 Personal history of other diseases of the circulatory system: Secondary | ICD-10-CM | POA: Insufficient documentation

## 2012-10-31 DIAGNOSIS — R34 Anuria and oliguria: Secondary | ICD-10-CM | POA: Insufficient documentation

## 2012-10-31 DIAGNOSIS — G40909 Epilepsy, unspecified, not intractable, without status epilepticus: Secondary | ICD-10-CM | POA: Insufficient documentation

## 2012-10-31 DIAGNOSIS — E669 Obesity, unspecified: Secondary | ICD-10-CM | POA: Insufficient documentation

## 2012-10-31 DIAGNOSIS — I10 Essential (primary) hypertension: Secondary | ICD-10-CM | POA: Insufficient documentation

## 2012-10-31 DIAGNOSIS — R0602 Shortness of breath: Secondary | ICD-10-CM | POA: Insufficient documentation

## 2012-10-31 DIAGNOSIS — J4489 Other specified chronic obstructive pulmonary disease: Secondary | ICD-10-CM | POA: Insufficient documentation

## 2012-10-31 DIAGNOSIS — Z8709 Personal history of other diseases of the respiratory system: Secondary | ICD-10-CM | POA: Insufficient documentation

## 2012-10-31 DIAGNOSIS — I2510 Atherosclerotic heart disease of native coronary artery without angina pectoris: Secondary | ICD-10-CM | POA: Insufficient documentation

## 2012-10-31 DIAGNOSIS — F411 Generalized anxiety disorder: Secondary | ICD-10-CM | POA: Insufficient documentation

## 2012-10-31 DIAGNOSIS — I509 Heart failure, unspecified: Secondary | ICD-10-CM | POA: Insufficient documentation

## 2012-10-31 DIAGNOSIS — Z7982 Long term (current) use of aspirin: Secondary | ICD-10-CM | POA: Insufficient documentation

## 2012-10-31 DIAGNOSIS — F17200 Nicotine dependence, unspecified, uncomplicated: Secondary | ICD-10-CM | POA: Insufficient documentation

## 2012-10-31 DIAGNOSIS — Z79899 Other long term (current) drug therapy: Secondary | ICD-10-CM | POA: Insufficient documentation

## 2012-10-31 DIAGNOSIS — I252 Old myocardial infarction: Secondary | ICD-10-CM | POA: Insufficient documentation

## 2012-10-31 DIAGNOSIS — Z8739 Personal history of other diseases of the musculoskeletal system and connective tissue: Secondary | ICD-10-CM | POA: Insufficient documentation

## 2012-10-31 DIAGNOSIS — E039 Hypothyroidism, unspecified: Secondary | ICD-10-CM | POA: Insufficient documentation

## 2012-10-31 DIAGNOSIS — F3289 Other specified depressive episodes: Secondary | ICD-10-CM | POA: Insufficient documentation

## 2012-10-31 HISTORY — DX: Unspecified convulsions: R56.9

## 2012-10-31 LAB — URINALYSIS, MICROSCOPIC ONLY
Bilirubin Urine: NEGATIVE
Glucose, UA: NEGATIVE mg/dL

## 2012-10-31 LAB — CBC WITH DIFFERENTIAL/PLATELET
Basophils Relative: 1 % (ref 0–1)
Eosinophils Absolute: 0.2 10*3/uL (ref 0.0–0.7)
Eosinophils Relative: 3 % (ref 0–5)
HCT: 36.5 % (ref 36.0–46.0)
Hemoglobin: 11.9 g/dL — ABNORMAL LOW (ref 12.0–15.0)
MCH: 29.1 pg (ref 26.0–34.0)
MCHC: 32.6 g/dL (ref 30.0–36.0)
MCV: 89.2 fL (ref 78.0–100.0)
Monocytes Absolute: 1.1 10*3/uL — ABNORMAL HIGH (ref 0.1–1.0)
Monocytes Relative: 17 % — ABNORMAL HIGH (ref 3–12)
Neutro Abs: 2.3 10*3/uL (ref 1.7–7.7)
RDW: 12.7 % (ref 11.5–15.5)

## 2012-10-31 LAB — BASIC METABOLIC PANEL
BUN: 16 mg/dL (ref 6–23)
Calcium: 9.8 mg/dL (ref 8.4–10.5)
Chloride: 105 mEq/L (ref 96–112)
Creatinine, Ser: 0.96 mg/dL (ref 0.50–1.10)
GFR calc Af Amer: 58 mL/min — ABNORMAL LOW (ref >90)

## 2012-10-31 MED ORDER — FUROSEMIDE 10 MG/ML IJ SOLN
80.0000 mg | Freq: Once | INTRAMUSCULAR | Status: AC
  Administered 2012-10-31: 80 mg via INTRAVENOUS
  Filled 2012-10-31: qty 8

## 2012-10-31 NOTE — ED Notes (Signed)
pts legs presents with bilateral 3+ pitting edema. She takes Lasix and took yesterday but not today due to complaints of urgency and straining.

## 2012-10-31 NOTE — ED Provider Notes (Signed)
History  This chart was scribed for Gilda Crease, MD by Shari Heritage, ED Scribe. The patient was seen in room APA02/APA02. Patient's care was started at 1834.   CSN: [id number]  Arrival date & time 10/31/12  1727   First MD Initiated Contact with Patient 10/31/12 1834      Chief Complaint  Patient presents with  . Leg Swelling     The history is provided by the patient. No language interpreter was used.    HPI Comments: [name] is a 79 y.o. female who presents to the Emergency Department complaining of bilateral leg swelling onset this morning. She states that she has had difficulty ambulating today secondary to swelling. There is associated decreased urine output. Patient says that she has dyspnea on exertion, but this is not a new problem today. Patient denies chest pain, fever, vomiting, nausea, diarrhea or abdominal pain. Patient is currently taking fluid pills. She has a medical history of MI, coronary artery disease, COPD, hypertension, lower leg edema and aneurysm.  Past Medical History  Diagnosis Date  . Myocardial infarction     history of  . Coronary artery disease   . Leg edema     left  . Aneurysm     hx of  . COPD (chronic obstructive pulmonary disease)   . Hypertension   . Pulmonary nodule   . Obesity   . Anxiety   . Depression   . Hemoptysis   . Pressure ulcer of back     lower  . Tobacco abuse   . DJD (degenerative joint disease)   . Panic disorder   . Urinary incontinence   . Seizure disorder   . Ganglion cyst     left wrist  . Hypothyroidism   . Leg edema   . DDD (degenerative disc disease), lumbosacral   . Seizures     Past Surgical History  Procedure Laterality Date  . Appendectomy    . Cholecystectomy    . Replacement total knee  11/2000    right   . Angioplasty      times 2    History reviewed. No pertinent family history.  History  Substance Use Topics  . Smoking status: Current Every Day Smoker    Types:  Cigarettes  . Smokeless tobacco: Never Used  . Alcohol Use: No    OB History   Grav Para Term Preterm Abortions TAB SAB Ect Mult Living                  Review of Systems  Constitutional: Negative for fever.  Respiratory: Positive for shortness of breath.   Cardiovascular: Positive for leg swelling.  Gastrointestinal: Negative for nausea, vomiting and diarrhea.  Genitourinary: Positive for decreased urine volume.  All other systems reviewed and are negative.    Allergies  Review of patient's allergies indicates no known allergies.  Home Medications   Current Outpatient Rx  Name  Route  Sig  Dispense  Refill  . ALPRAZolam (XANAX) 0.25 MG tablet   Oral   Take 1 tablet (0.25 mg total) by mouth at bedtime as needed for sleep.   30 tablet   2   . aspirin EC 81 MG tablet   Oral   Take 81 mg by mouth daily.         Marland Kitchen ezetimibe-simvastatin (VYTORIN) 10-80 MG per tablet   Oral   Take 1 tablet by mouth at bedtime.           Marland Kitchen  folic acid (FOLVITE) 1 MG tablet   Oral   Take 1 tablet (1 mg total) by mouth daily.   30 tablet   4   . HYDROcodone-acetaminophen (NORCO/VICODIN) 5-325 MG per tablet   Oral   Take 1 tablet by mouth every 6 (six) hours as needed for pain.         Marland Kitchen levETIRAcetam (KEPPRA) 250 MG tablet   Oral   Take 1 tablet (250 mg total) by mouth 2 (two) times daily.   60 tablet   4   . levothyroxine (SYNTHROID, LEVOTHROID) 150 MCG tablet   Oral   Take 1 tablet (150 mcg total) by mouth daily.   30 tablet   4     New dose   . neomycin-polymyxin-hydrocortisone (CORTISPORIN) 3.5-10000-1 otic suspension   Otic   Place 4 drops in ear(s) 3 (three) times daily.   10 mL   0   . potassium chloride (K-DUR) 10 MEQ tablet   Oral   Take 1 tablet (10 mEq total) by mouth daily.   30 tablet   4   . torsemide (DEMADEX) 20 MG tablet   Oral   Take 1 tablet (20 mg total) by mouth daily. For leg swelling, take with potassium   30 tablet   3   .  acetaminophen (TYLENOL) 325 MG tablet   Oral   Take 2 tablets (650 mg total) by mouth every 4 (four) hours as needed (or Fever >/= 101).         Marland Kitchen diclofenac sodium (VOLTAREN) 1 % GEL   Topical   Apply 2 g topically every 6 (six) hours as needed (pain).   100 g   3   . Olopatadine HCl 0.2 % SOLN   Both Eyes   Place 1 drop into both eyes daily as needed. 1 drop each eye daily as needed(dry eyes)           Triage Vitals: BP 115/66  Pulse 86  Temp(Src) 97.8 F (36.6 C) (Oral)  Resp 20  Ht 5' 8.5" (1.74 m)  Wt 180 lb (81.647 kg)  BMI 26.97 kg/m2  SpO2 100%  Physical Exam  Constitutional: She is oriented to person, place, and time. She appears well-developed and well-nourished. No distress.  HENT:  Head: Normocephalic and atraumatic.  Right Ear: Hearing normal.  Nose: Nose normal.  Mouth/Throat: Oropharynx is clear and moist and mucous membranes are normal.  Eyes: Conjunctivae and EOM are normal. Pupils are equal, round, and reactive to light.  Neck: Normal range of motion. Neck supple.  Cardiovascular: Normal rate, regular rhythm, S1 normal and S2 normal.  Exam reveals gallop and S3. Exam reveals no friction rub.   No murmur heard. Pulmonary/Chest: Effort normal and breath sounds normal. No respiratory distress. She has no wheezes. She has no rales. She exhibits no tenderness.  Abdominal: Soft. Normal appearance and bowel sounds are normal. There is no hepatosplenomegaly. There is no tenderness. There is no rebound, no guarding, no tenderness at McBurney's point and negative Murphy's sign. No hernia.  Musculoskeletal: Normal range of motion. She exhibits edema.  3+ pitting edema of bilateral lower legs.  Neurological: She is alert and oriented to person, place, and time. She has normal strength. No cranial nerve deficit or sensory deficit. Coordination normal. GCS eye subscore is 4. GCS verbal subscore is 5. GCS motor subscore is 6.  Skin: Skin is warm, dry and intact. No  rash noted. No cyanosis.  Psychiatric: She has a  normal mood and affect. Her speech is normal and behavior is normal. Thought content normal.    ED Course  Procedures (including critical care time) DIAGNOSTIC STUDIES: Oxygen Saturation is 100% on room air, normal by my interpretation.     Date: 10/31/2012  Rate: 74  Rhythm: atrial fibrillation  QRS Axis: normal  Intervals: no p waves  ST/T Wave abnormalities: normal  Conduction Disutrbances:right bundle branch block  Narrative Interpretation:   Old EKG Reviewed: unchanged    COORDINATION OF CARE: 6:51 PM- Patient informed of current plan for treatment and evaluation and agrees with plan at this time.      Labs Reviewed  CBC WITH DIFFERENTIAL - Abnormal; Notable for the following:    Hemoglobin 11.9 (*)    Neutrophils Relative 37 (*)    Monocytes Relative 17 (*)    Monocytes Absolute 1.1 (*)    All other components within normal limits  BASIC METABOLIC PANEL - Abnormal; Notable for the following:    GFR calc non Af Amer 50 (*)    GFR calc Af Amer 58 (*)    All other components within normal limits  PRO B NATRIURETIC PEPTIDE - Abnormal; Notable for the following:    Pro B Natriuretic peptide (BNP) 3114.0 (*)    All other components within normal limits  URINALYSIS, MICROSCOPIC ONLY - Abnormal; Notable for the following:    Specific Gravity, Urine >1.030 (*)    Hgb urine dipstick MODERATE (*)    Protein, ur TRACE (*)    Leukocytes, UA SMALL (*)    Bacteria, UA MANY (*)    All other components within normal limits  URINE CULTURE  TROPONIN I   Dg Chest 2 View  10/31/2012  *RADIOLOGY REPORT*  Clinical Data: Chest pain.  Shortness of breath.  Bilateral lower extremity edema.  Smoker.  History of coronary artery disease with MI.  CHEST - 2 VIEW  Comparison: One-view chest x-ray 04/23/2012 and two-view chest x- ray 09/21/2011.  Findings: Suboptimal inspiration due to body habitus which accounts for crowded bronchovascular  markings at the bases and accentuates the cardiac silhouette.  There is no account, cardiac silhouette moderately enlarged but stable.  Pulmonary vascularity normal without evidence of pulmonary edema.  Consolidation in the left lower lobe posteriorly.  Lungs otherwise clear.  No visible pleural effusions.  Elevation of the left hemidiaphragm as noted previously.  Thoracic aorta atherosclerotic, unchanged.  Hilar and mediastinal contours otherwise unremarkable. Degenerative changes and DISH involving the thoracic spine.  IMPRESSION: Stable moderate cardiomegaly without pulmonary edema.  Left lower lobe atelectasis and/or pneumonia.   Original Report Authenticated By: Hulan Saas, M.D.      Diagnosis: Congestive heart failure    MDM  Patient comes to the ER for evaluation of increased swelling of both of her legs. It's not clear if she is taking her torsemide daily. She does have a history of congestive heart failure. She has had progressive swelling over a couple of days, but seems be more pronounced today. She is comfortable resting in the bed. Oxygen saturations 100% on room air. Blood pressure is normal. Lungs are clear, no rales or signs of pulmonary edema. Workup does reveal elevated natruretic peptide. Additionally she has pitting edema of the lower extremities. Patient was administered IV Lasix here with significant diuresis. As she has been stable from a normal vital signs and is breathing comfortably, she can be discharged to home to continue diuresis and follow up with primary Dr. Return to the  ER if symptoms worsen.      I personally performed the services described in this documentation, which was scribed in my presence. The recorded information has been reviewed and is accurate.   Gilda Crease, MD 10/31/12 2121

## 2012-10-31 NOTE — ED Notes (Signed)
Swelling of both legs, onset this am.  Feels she is voiding small amts.

## 2012-10-31 NOTE — ED Notes (Signed)
Pt alert & oriented x4. Patient given discharge instructions, paperwork & prescription(s). Patient carried out via wheelchair. Patient Parent verbalized understanding. Pt left department w/ no further questions.

## 2012-11-02 LAB — URINE CULTURE

## 2012-11-04 ENCOUNTER — Telehealth: Payer: Self-pay | Admitting: Family Medicine

## 2012-11-04 NOTE — Telephone Encounter (Signed)
I spoke with Ms. Steinmeyer. She was seen in the ED secondary to leg swelling. She states her legs are still swelling she occasionally gets pains in them. She states she also is been checking her pulse it was a little irregular earlier today. She's not taking her water pill today but was previously urinating with this. I will have her take 40 mg Demadex. She also advised me that she wants to go into a nursing home she is scheduled appointment to see me tomorrow with her family members. She's given red flags and understands when to call 911. I also attempted to call her daughter as well as her niece who her caregivers for her both were left messages to have him get checking on her this evening

## 2012-11-05 ENCOUNTER — Ambulatory Visit: Payer: Medicare PPO | Admitting: Family Medicine

## 2012-11-05 ENCOUNTER — Telehealth: Payer: Self-pay | Admitting: Family Medicine

## 2012-11-05 NOTE — Telephone Encounter (Signed)
Pt rescheduled due to weather, her daughter was in office today, advised her mother via phone to increase demadex to 2 tablets daily until Friday when she is seen Her mother has not been following instructions.  Called Pt again and reiterated importance of medications, high risk for CHF, she voiced understanding,

## 2012-11-08 ENCOUNTER — Encounter (HOSPITAL_COMMUNITY): Payer: Self-pay | Admitting: Emergency Medicine

## 2012-11-08 ENCOUNTER — Encounter: Payer: Self-pay | Admitting: Family Medicine

## 2012-11-08 ENCOUNTER — Inpatient Hospital Stay (HOSPITAL_COMMUNITY)
Admission: EM | Admit: 2012-11-08 | Discharge: 2012-11-11 | DRG: 292 | Disposition: A | Discharge status: Home / Self Care | Payer: Medicare PPO | Attending: Internal Medicine | Admitting: Internal Medicine

## 2012-11-08 ENCOUNTER — Telehealth: Payer: Self-pay | Admitting: Family Medicine

## 2012-11-08 ENCOUNTER — Ambulatory Visit (INDEPENDENT_AMBULATORY_CARE_PROVIDER_SITE_OTHER): Payer: Medicare PPO | Admitting: Family Medicine

## 2012-11-08 ENCOUNTER — Emergency Department (HOSPITAL_COMMUNITY): Payer: Medicare PPO

## 2012-11-08 VITALS — BP 118/74 | HR 70 | Resp 18 | Ht 68.0 in | Wt 204.0 lb

## 2012-11-08 DIAGNOSIS — M1990 Unspecified osteoarthritis, unspecified site: Secondary | ICD-10-CM | POA: Diagnosis present

## 2012-11-08 DIAGNOSIS — J449 Chronic obstructive pulmonary disease, unspecified: Secondary | ICD-10-CM | POA: Diagnosis present

## 2012-11-08 DIAGNOSIS — R5381 Other malaise: Secondary | ICD-10-CM

## 2012-11-08 DIAGNOSIS — I4891 Unspecified atrial fibrillation: Secondary | ICD-10-CM

## 2012-11-08 DIAGNOSIS — M5137 Other intervertebral disc degeneration, lumbosacral region: Secondary | ICD-10-CM | POA: Diagnosis present

## 2012-11-08 DIAGNOSIS — Z833 Family history of diabetes mellitus: Secondary | ICD-10-CM

## 2012-11-08 DIAGNOSIS — I2510 Atherosclerotic heart disease of native coronary artery without angina pectoris: Secondary | ICD-10-CM | POA: Diagnosis present

## 2012-11-08 DIAGNOSIS — F17200 Nicotine dependence, unspecified, uncomplicated: Secondary | ICD-10-CM | POA: Diagnosis present

## 2012-11-08 DIAGNOSIS — R569 Unspecified convulsions: Secondary | ICD-10-CM | POA: Diagnosis present

## 2012-11-08 DIAGNOSIS — E039 Hypothyroidism, unspecified: Secondary | ICD-10-CM | POA: Diagnosis present

## 2012-11-08 DIAGNOSIS — I10 Essential (primary) hypertension: Secondary | ICD-10-CM | POA: Diagnosis present

## 2012-11-08 DIAGNOSIS — J4489 Other specified chronic obstructive pulmonary disease: Secondary | ICD-10-CM | POA: Diagnosis present

## 2012-11-08 DIAGNOSIS — Z8249 Family history of ischemic heart disease and other diseases of the circulatory system: Secondary | ICD-10-CM

## 2012-11-08 DIAGNOSIS — R609 Edema, unspecified: Secondary | ICD-10-CM

## 2012-11-08 DIAGNOSIS — F329 Major depressive disorder, single episode, unspecified: Secondary | ICD-10-CM | POA: Diagnosis present

## 2012-11-08 DIAGNOSIS — F411 Generalized anxiety disorder: Secondary | ICD-10-CM | POA: Diagnosis present

## 2012-11-08 DIAGNOSIS — R0609 Other forms of dyspnea: Secondary | ICD-10-CM

## 2012-11-08 DIAGNOSIS — G40909 Epilepsy, unspecified, not intractable, without status epilepticus: Secondary | ICD-10-CM | POA: Diagnosis present

## 2012-11-08 DIAGNOSIS — I509 Heart failure, unspecified: Secondary | ICD-10-CM

## 2012-11-08 DIAGNOSIS — N390 Urinary tract infection, site not specified: Secondary | ICD-10-CM | POA: Diagnosis present

## 2012-11-08 DIAGNOSIS — E669 Obesity, unspecified: Secondary | ICD-10-CM | POA: Diagnosis present

## 2012-11-08 DIAGNOSIS — I252 Old myocardial infarction: Secondary | ICD-10-CM

## 2012-11-08 DIAGNOSIS — M51379 Other intervertebral disc degeneration, lumbosacral region without mention of lumbar back pain or lower extremity pain: Secondary | ICD-10-CM | POA: Diagnosis present

## 2012-11-08 DIAGNOSIS — D649 Anemia, unspecified: Secondary | ICD-10-CM

## 2012-11-08 DIAGNOSIS — Z6831 Body mass index (BMI) 31.0-31.9, adult: Secondary | ICD-10-CM

## 2012-11-08 DIAGNOSIS — F3289 Other specified depressive episodes: Secondary | ICD-10-CM | POA: Diagnosis present

## 2012-11-08 DIAGNOSIS — A498 Other bacterial infections of unspecified site: Secondary | ICD-10-CM | POA: Diagnosis present

## 2012-11-08 DIAGNOSIS — I5033 Acute on chronic diastolic (congestive) heart failure: Principal | ICD-10-CM | POA: Diagnosis present

## 2012-11-08 DIAGNOSIS — Z66 Do not resuscitate: Secondary | ICD-10-CM | POA: Diagnosis present

## 2012-11-08 LAB — BASIC METABOLIC PANEL
BUN: 19 mg/dL (ref 6–23)
Chloride: 102 mEq/L (ref 96–112)
Glucose, Bld: 89 mg/dL (ref 70–99)
Potassium: 4 mEq/L (ref 3.5–5.1)

## 2012-11-08 LAB — URINE MICROSCOPIC-ADD ON

## 2012-11-08 LAB — CBC WITH DIFFERENTIAL/PLATELET
Basophils Relative: 0 % (ref 0–1)
Eosinophils Absolute: 0.1 10*3/uL (ref 0.0–0.7)
HCT: 41.2 % (ref 36.0–46.0)
Hemoglobin: 13.5 g/dL (ref 12.0–15.0)
MCH: 29 pg (ref 26.0–34.0)
MCHC: 32.8 g/dL (ref 30.0–36.0)
MCV: 88.4 fL (ref 78.0–100.0)
Monocytes Absolute: 0.9 10*3/uL (ref 0.1–1.0)
Monocytes Relative: 14 % — ABNORMAL HIGH (ref 3–12)

## 2012-11-08 LAB — URINALYSIS, ROUTINE W REFLEX MICROSCOPIC
Bilirubin Urine: NEGATIVE
Specific Gravity, Urine: 1.01 (ref 1.005–1.030)
Urobilinogen, UA: 0.2 mg/dL (ref 0.0–1.0)

## 2012-11-08 LAB — TROPONIN I: Troponin I: <0.3 ng/mL (ref <0.30)

## 2012-11-08 MED ORDER — FUROSEMIDE 10 MG/ML IJ SOLN
40.0000 mg | Freq: Once | INTRAMUSCULAR | Status: AC
  Administered 2012-11-08: 40 mg via INTRAVENOUS
  Filled 2012-11-08: qty 4

## 2012-11-08 MED ORDER — FOLIC ACID 1 MG PO TABS
1.0000 mg | ORAL_TABLET | Freq: Every day | ORAL | Status: DC
  Administered 2012-11-08 – 2012-11-11 (×4): 1 mg via ORAL
  Filled 2012-11-08 (×5): qty 1

## 2012-11-08 MED ORDER — SODIUM CHLORIDE 0.9 % IV SOLN: 250.0000 mL | INTRAVENOUS | Status: DC | PRN

## 2012-11-08 MED ORDER — WARFARIN SODIUM 5 MG PO TABS
5.0000 mg | ORAL_TABLET | Freq: Once | ORAL | Status: AC
  Administered 2012-11-08: 5 mg via ORAL
  Filled 2012-11-08: qty 1

## 2012-11-08 MED ORDER — SODIUM CHLORIDE 0.9 % IJ SOLN
3.0000 mL | Freq: Two times a day (BID) | INTRAMUSCULAR | Status: DC
  Administered 2012-11-08 – 2012-11-11 (×4): 3 mL via INTRAVENOUS

## 2012-11-08 MED ORDER — ONDANSETRON HCL 4 MG/2ML IJ SOLN: 4.0000 mg | Freq: Four times a day (QID) | INTRAMUSCULAR | Status: DC | PRN

## 2012-11-08 MED ORDER — CEFTRIAXONE SODIUM 1 G IJ SOLR
1.0000 g | INTRAMUSCULAR | Status: DC
  Administered 2012-11-09: 1 g via INTRAVENOUS
  Filled 2012-11-08 (×2): qty 10

## 2012-11-08 MED ORDER — LEVOTHYROXINE SODIUM 75 MCG PO TABS
150.0000 ug | ORAL_TABLET | Freq: Every day | ORAL | Status: DC
  Filled 2012-11-08: qty 2

## 2012-11-08 MED ORDER — ACETAMINOPHEN 325 MG PO TABS
650.0000 mg | ORAL_TABLET | ORAL | Status: DC | PRN
  Administered 2012-11-10: 650 mg via ORAL
  Filled 2012-11-08: qty 2

## 2012-11-08 MED ORDER — OLOPATADINE HCL 0.1 % OP SOLN
1.0000 [drp] | Freq: Every day | OPHTHALMIC | Status: DC | PRN
  Filled 2012-11-08: qty 0.1

## 2012-11-08 MED ORDER — ATORVASTATIN CALCIUM 40 MG PO TABS
40.0000 mg | ORAL_TABLET | Freq: Every day | ORAL | Status: DC
  Administered 2012-11-08 – 2012-11-10 (×3): 40 mg via ORAL
  Filled 2012-11-08 (×3): qty 1

## 2012-11-08 MED ORDER — ALPRAZOLAM 0.25 MG PO TABS
0.2500 mg | ORAL_TABLET | Freq: Every evening | ORAL | Status: DC | PRN
  Administered 2012-11-09 – 2012-11-10 (×2): 0.25 mg via ORAL
  Filled 2012-11-08 (×2): qty 1

## 2012-11-08 MED ORDER — ASPIRIN EC 81 MG PO TBEC
81.0000 mg | DELAYED_RELEASE_TABLET | Freq: Every day | ORAL | Status: DC
  Administered 2012-11-08 – 2012-11-11 (×4): 81 mg via ORAL
  Filled 2012-11-08 (×5): qty 1

## 2012-11-08 MED ORDER — SODIUM CHLORIDE 0.9 % IV SOLN: INTRAVENOUS | Status: DC

## 2012-11-08 MED ORDER — HYDROCODONE-ACETAMINOPHEN 5-325 MG PO TABS
1.0000 | ORAL_TABLET | Freq: Four times a day (QID) | ORAL | Status: DC | PRN
  Administered 2012-11-09 – 2012-11-10 (×2): 1 via ORAL
  Filled 2012-11-08 (×2): qty 1

## 2012-11-08 MED ORDER — SODIUM CHLORIDE 0.9 % IJ SOLN: 3.0000 mL | INTRAMUSCULAR | Status: DC | PRN

## 2012-11-08 MED ORDER — FUROSEMIDE 40 MG PO TABS
40.0000 mg | ORAL_TABLET | Freq: Two times a day (BID) | ORAL | Status: DC
  Administered 2012-11-09 (×2): 40 mg via ORAL
  Filled 2012-11-08 (×3): qty 1

## 2012-11-08 MED ORDER — EZETIMIBE 10 MG PO TABS
10.0000 mg | ORAL_TABLET | Freq: Every day | ORAL | Status: DC
  Administered 2012-11-08 – 2012-11-10 (×3): 10 mg via ORAL
  Filled 2012-11-08 (×3): qty 1

## 2012-11-08 MED ORDER — EZETIMIBE-SIMVASTATIN 10-80 MG PO TABS: 1.0000 | ORAL_TABLET | Freq: Every day | ORAL | Status: DC

## 2012-11-08 MED ORDER — WARFARIN - PHARMACIST DOSING INPATIENT
Freq: Every day | Status: DC
  Administered 2012-11-10: 16:00:00

## 2012-11-08 MED ORDER — LEVETIRACETAM 500 MG PO TABS
250.0000 mg | ORAL_TABLET | Freq: Two times a day (BID) | ORAL | Status: DC
  Administered 2012-11-08: 250 mg via ORAL
  Administered 2012-11-09: 21:00:00 via ORAL
  Administered 2012-11-09 – 2012-11-11 (×4): 250 mg via ORAL
  Filled 2012-11-08 (×2): qty 1
  Filled 2012-11-08: qty 2
  Filled 2012-11-08 (×3): qty 1

## 2012-11-08 MED ORDER — CARVEDILOL 3.125 MG PO TABS
3.1250 mg | ORAL_TABLET | Freq: Two times a day (BID) | ORAL | Status: DC
  Administered 2012-11-08 – 2012-11-11 (×6): 3.125 mg via ORAL
  Filled 2012-11-08 (×7): qty 1

## 2012-11-08 MED ORDER — ENOXAPARIN SODIUM 100 MG/ML ~~LOC~~ SOLN
90.0000 mg | Freq: Two times a day (BID) | SUBCUTANEOUS | Status: DC
  Administered 2012-11-08: 23:00:00 via SUBCUTANEOUS
  Administered 2012-11-09 – 2012-11-11 (×5): 90 mg via SUBCUTANEOUS
  Filled 2012-11-08 (×6): qty 1

## 2012-11-08 MED ORDER — LISINOPRIL 5 MG PO TABS
5.0000 mg | ORAL_TABLET | Freq: Every day | ORAL | Status: DC
  Administered 2012-11-08 – 2012-11-11 (×4): 5 mg via ORAL
  Filled 2012-11-08 (×4): qty 1

## 2012-11-08 MED ORDER — CEFTRIAXONE SODIUM 1 G IJ SOLR
1.0000 g | Freq: Once | INTRAMUSCULAR | Status: AC
  Administered 2012-11-08: 1 g via INTRAVENOUS
  Filled 2012-11-08: qty 10

## 2012-11-08 MED ORDER — POTASSIUM CHLORIDE CRYS ER 20 MEQ PO TBCR
40.0000 meq | EXTENDED_RELEASE_TABLET | Freq: Every day | ORAL | Status: DC
  Administered 2012-11-08 – 2012-11-11 (×4): 40 meq via ORAL
  Filled 2012-11-08 (×4): qty 2

## 2012-11-08 NOTE — Telephone Encounter (Signed)
Spoke with pt daughter Rosey Bath understands plan for admission

## 2012-11-08 NOTE — Progress Notes (Signed)
ANTICOAGULATION CONSULT NOTE - Initial Consult  Pharmacy Consult for lovenox & coumadin Indication: atrial fibrillation  No Known Allergies  Patient Measurements: Height: 5\' 8"  (172.7 cm) Weight: 201 lb 4.5 oz (91.3 kg) IBW/kg (Calculated) : 63.9 Vital Signs: Temp: 97.7 F (36.5 C) (03/28 1431) Temp src: Oral (03/28 1431) BP: 126/75 mmHg (03/28 1600) Pulse Rate: 62 (03/28 1600)  Labs:  Recent Labs  11/08/12 1308  HGB 13.5  HCT 41.2  PLT 257  CREATININE 1.06  TROPONINI <0.30    Estimated Creatinine Clearance: 40.9 ml/min (by C-G formula based on Cr of 1.06).   Medical History: Past Medical History  Diagnosis Date  . Myocardial infarction     history of  . Coronary artery disease   . Leg edema     left  . Aneurysm     hx of  . COPD (chronic obstructive pulmonary disease)   . Hypertension   . Pulmonary nodule   . Obesity   . Anxiety   . Depression   . Hemoptysis   . Pressure ulcer of back     lower  . Tobacco abuse   . DJD (degenerative joint disease)   . Panic disorder   . Urinary incontinence   . Seizure disorder   . Ganglion cyst     left wrist  . Hypothyroidism   . Leg edema   . DDD (degenerative disc disease), lumbosacral   . Seizures    Assessment: 79yo female admitted with new onset afib   Goal of Therapy:    Plan:  Coumadin 5mg  x1 lovenox 1mg /kg sq q12h inr daily  [name] A 11/08/2012,6:53 PM

## 2012-11-08 NOTE — ED Notes (Signed)
Family at bedside. 

## 2012-11-08 NOTE — ED Provider Notes (Signed)
History     CSN: [id number]  Arrival date & time 11/08/12  1300   First MD Initiated Contact with Patient 11/08/12 1306      Chief Complaint  Patient presents with  . Irregular Heart Beat    HPI Pt was seen at 1325.   Per pt, c/o gradual onset and worsening of persistent palpitations, SOB and peripheral edema for the past 1 week, worse over the past few days.  Pt states she has "gained over 20 pounds" in the past 1 week despite taking an increased daily dose of lasix.  States her SOB and palpitations worsen when she ambulates.  Pt was eval in the ED 1 week ago for same, as well as eval by her PMD today for same.  Pt's PMD sent her to the ED for further evaluation and admission.  Pt denies CP, no cough, no back pain, no abd pain, no N/V/D, no fevers.     Past Medical History  Diagnosis Date  . Myocardial infarction     history of  . Coronary artery disease   . Leg edema     left  . Aneurysm     hx of  . COPD (chronic obstructive pulmonary disease)   . Hypertension   . Pulmonary nodule   . Obesity   . Anxiety   . Depression   . Hemoptysis   . Pressure ulcer of back     lower  . Tobacco abuse   . DJD (degenerative joint disease)   . Panic disorder   . Urinary incontinence   . Seizure disorder   . Ganglion cyst     left wrist  . Hypothyroidism   . Leg edema   . DDD (degenerative disc disease), lumbosacral   . Seizures     Past Surgical History  Procedure Laterality Date  . Appendectomy    . Cholecystectomy    . Replacement total knee  11/2000    right   . Angioplasty      times 2    History  Substance Use Topics  . Smoking status: Current Every Day Smoker    Types: Cigarettes  . Smokeless tobacco: Never Used  . Alcohol Use: No      Review of Systems ROS: Statement: All systems negative except as marked or noted in the HPI; Constitutional: Negative for fever and chills. ; ; Eyes: Negative for eye pain, redness and discharge. ; ; ENMT: Negative for ear  pain, hoarseness, nasal congestion, sinus pressure and sore throat. ; ; Cardiovascular: Negative for chest pain, diaphoresis, +palpitations, dyspnea and peripheral edema. ; ; Respiratory: Negative for cough, wheezing and stridor. ; ; Gastrointestinal: Negative for nausea, vomiting, diarrhea, abdominal pain, blood in stool, hematemesis, jaundice and rectal bleeding. . ; ; Genitourinary: Negative for dysuria, flank pain and hematuria. ; ; Musculoskeletal: Negative for back pain and neck pain. Negative for swelling and trauma.; ; Skin: Negative for pruritus, rash, abrasions, blisters, bruising and skin lesion.; ; Neuro: Negative for headache, lightheadedness and neck stiffness. Negative for weakness, altered level of consciousness , altered mental status, extremity weakness, paresthesias, involuntary movement, seizure and syncope.       Allergies  Review of patient's allergies indicates no known allergies.  Home Medications   Current Outpatient Rx  Name  Route  Sig  Dispense  Refill  . acetaminophen (TYLENOL) 325 MG tablet   Oral   Take 2 tablets (650 mg total) by mouth every 4 (four) hours as needed (or  Fever >/= 101).         Marland Kitchen ALPRAZolam (XANAX) 0.25 MG tablet   Oral   Take 1 tablet (0.25 mg total) by mouth at bedtime as needed for sleep.   30 tablet   2   . aspirin EC 81 MG tablet   Oral   Take 81 mg by mouth daily.         Marland Kitchen ezetimibe-simvastatin (VYTORIN) 10-80 MG per tablet   Oral   Take 1 tablet by mouth at bedtime.           . folic acid (FOLVITE) 1 MG tablet   Oral   Take 1 tablet (1 mg total) by mouth daily.   30 tablet   4   . HYDROcodone-acetaminophen (NORCO/VICODIN) 5-325 MG per tablet   Oral   Take 1 tablet by mouth every 6 (six) hours as needed for pain.         Marland Kitchen levETIRAcetam (KEPPRA) 250 MG tablet   Oral   Take 1 tablet (250 mg total) by mouth 2 (two) times daily.   60 tablet   4   . levothyroxine (SYNTHROID, LEVOTHROID) 150 MCG tablet   Oral    Take 1 tablet (150 mcg total) by mouth daily.   30 tablet   4     New dose   . neomycin-polymyxin-hydrocortisone (CORTISPORIN) 3.5-10000-1 otic suspension   Otic   Place 4 drops in ear(s) 3 (three) times daily.   10 mL   0   . Olopatadine HCl 0.2 % SOLN   Both Eyes   Place 1 drop into both eyes daily as needed. 1 drop each eye daily as needed(dry eyes)         . potassium chloride (K-DUR) 10 MEQ tablet   Oral   Take 1 tablet (10 mEq total) by mouth daily.   30 tablet   4   . torsemide (DEMADEX) 20 MG tablet   Oral   Take 1 tablet (20 mg total) by mouth daily. For leg swelling, take with potassium   30 tablet   3   . diclofenac sodium (VOLTAREN) 1 % GEL   Topical   Apply 2 g topically every 6 (six) hours as needed (pain).   100 g   3     BP 126/67  Pulse 74  Temp(Src) 97.7 F (36.5 C) (Oral)  Resp 20  Ht 5\' 8"  (1.727 m)  Wt 204 lb (92.534 kg)  BMI 31.03 kg/m2  SpO2 97%  Physical Exam 1330: Physical examination:  Nursing notes reviewed; Vital signs and O2 SAT reviewed;  Constitutional: Well developed, Well nourished, Well hydrated, In no acute distress; Head:  Normocephalic, atraumatic; Eyes: EOMI, PERRL, No scleral icterus; ENMT: Mouth and pharynx normal, Mucous membranes moist; Neck: Supple, Full range of motion, No lymphadenopathy; Cardiovascular: Irregular irregular rate and rhythm, No gallop; Respiratory: Breath sounds clear & equal bilaterally, No rales, rhonchi, wheezes.  Speaking full sentences with ease, Normal respiratory effort/excursion; Chest: Nontender, Movement normal; Abdomen: Soft, Nontender, Nondistended, Normal bowel sounds; Genitourinary: No CVA tenderness; Extremities: Pulses normal, No tenderness, +1 pedal edema bilat without calf asymmetry.; Neuro: AA&Ox3, Major CN grossly intact.  Speech clear. No gross focal motor or sensory deficits in extremities.; Skin: Color normal, Warm, Dry.   ED Course  Procedures     MDM  MDM Reviewed:  previous chart, nursing note and vitals Reviewed previous: ECG and labs Interpretation: ECG, labs and x-ray    Date: 11/08/2012  Rate:  73  Rhythm: atrial fibrillation  QRS Axis: left  Intervals: normal  ST/T Wave abnormalities: normal  Conduction Disutrbances:right bundle branch block  Narrative Interpretation:   Old EKG Reviewed: unchanged and changes noted; no significant changes from previous EKG dated 10/31/2012 but changed from EKG's dated 9/10/213 and 11/22/2010 which were NSR.  Results for orders placed during the hospital encounter of 11/08/12  BASIC METABOLIC PANEL      Result Value Range   Sodium 141  135 - 145 mEq/L   Potassium 4.0  3.5 - 5.1 mEq/L   Chloride 102  96 - 112 mEq/L   CO2 28  19 - 32 mEq/L   Glucose, Bld 89  70 - 99 mg/dL   BUN 19  6 - 23 mg/dL   Creatinine, Ser 1.30  0.50 - 1.10 mg/dL   Calcium 86.5  8.4 - 78.4 mg/dL   GFR calc non Af Amer 44 (*) >90 mL/min   GFR calc Af Amer 52 (*) >90 mL/min  CBC WITH DIFFERENTIAL      Result Value Range   WBC 6.3  4.0 - 10.5 K/uL   RBC 4.66  3.87 - 5.11 MIL/uL   Hemoglobin 13.5  12.0 - 15.0 g/dL   HCT 69.6  29.5 - 28.4 %   MCV 88.4  78.0 - 100.0 fL   MCH 29.0  26.0 - 34.0 pg   MCHC 32.8  30.0 - 36.0 g/dL   RDW 13.2  44.0 - 10.2 %   Platelets 257  150 - 400 K/uL   Neutrophils Relative 40 (*) 43 - 77 %   Neutro Abs 2.5  1.7 - 7.7 K/uL   Lymphocytes Relative 44  12 - 46 %   Lymphs Abs 2.7  0.7 - 4.0 K/uL   Monocytes Relative 14 (*) 3 - 12 %   Monocytes Absolute 0.9  0.1 - 1.0 K/uL   Eosinophils Relative 2  0 - 5 %   Eosinophils Absolute 0.1  0.0 - 0.7 K/uL   Basophils Relative 0  0 - 1 %   Basophils Absolute 0.0  0.0 - 0.1 K/uL  URINALYSIS, ROUTINE W REFLEX MICROSCOPIC      Result Value Range   Color, Urine YELLOW  YELLOW   APPearance HAZY (*) CLEAR   Specific Gravity, Urine 1.010  1.005 - 1.030   pH 7.5  5.0 - 8.0   Glucose, UA NEGATIVE  NEGATIVE mg/dL   Hgb urine dipstick SMALL (*) NEGATIVE   Bilirubin  Urine NEGATIVE  NEGATIVE   Ketones, ur NEGATIVE  NEGATIVE mg/dL   Protein, ur TRACE (*) NEGATIVE mg/dL   Urobilinogen, UA 0.2  0.0 - 1.0 mg/dL   Nitrite POSITIVE (*) NEGATIVE   Leukocytes, UA LARGE (*) NEGATIVE  TROPONIN I      Result Value Range   Troponin I <0.30  <0.30 ng/mL  PRO B NATRIURETIC PEPTIDE      Result Value Range   Pro B Natriuretic peptide (BNP) 2516.0 (*) 0 - 450 pg/mL  URINE MICROSCOPIC-ADD ON      Result Value Range   Squamous Epithelial / LPF RARE  RARE   WBC, UA TOO NUMEROUS TO COUNT  <3 WBC/hpf   RBC / HPF 3-6  <3 RBC/hpf   Bacteria, UA MANY (*) RARE   Dg Chest 2 View 10/31/2012  *RADIOLOGY REPORT*  Clinical Data: Chest pain.  Shortness of breath.  Bilateral lower extremity edema.  Smoker.  History of coronary artery disease with MI.  CHEST - 2 VIEW  Comparison: One-view chest x-ray 04/23/2012 and two-view chest x- ray 09/21/2011.  Findings: Suboptimal inspiration due to body habitus which accounts for crowded bronchovascular markings at the bases and accentuates the cardiac silhouette.  There is no account, cardiac silhouette moderately enlarged but stable.  Pulmonary vascularity normal without evidence of pulmonary edema.  Consolidation in the left lower lobe posteriorly.  Lungs otherwise clear.  No visible pleural effusions.  Elevation of the left hemidiaphragm as noted previously.  Thoracic aorta atherosclerotic, unchanged.  Hilar and mediastinal contours otherwise unremarkable. Degenerative changes and DISH involving the thoracic spine.  IMPRESSION: Stable moderate cardiomegaly without pulmonary edema.  Left lower lobe atelectasis and/or pneumonia.   Original Report Authenticated By: Hulan Saas, M.D.    Dg Chest Port 1 View 11/08/2012  *RADIOLOGY REPORT*  Clinical Data: Atrial fibrillation  PORTABLE CHEST - 1 VIEW  Comparison: 10/31/2012  Findings: The cardiac shadow is mildly enlarged.  The left hemidiaphragm remains mildly elevated.  No focal infiltrate or  sizable effusion is seen.  Minimal left basilar atelectasis is noted.  IMPRESSION: No change from prior exam.   Original Report Authenticated By: Alcide Clever, M.D.     Results for SHANETA, CERVENKA (MRN [id number]) as of 11/08/2012 15:43  Ref. Range 08/28/2010 04:16 10/31/2012 19:10 11/08/2012 13:08  Pro B Natriuretic peptide (BNP) Latest Range: 0-450 pg/mL 136.0 (H) 3114.0 (H) 2516.0 (H)       1545:  Pt and her family state pt usually walks with her walker at home.  Pt ambulated with difficulty with ED Tech today, c/o increasing SOB despite O2 Sat remaining 97% R/A.  Pt's HR while ambulating increased to 120's from 70's at rest.  Continues with elevated BNP; will dose IV lasix.  +UTI, UC pending; will start IV rocephin.  Dx and testing d/w pt and family.  Questions answered.  Verb understanding, agreeable to admit. T/C to [facility]. Kerry Hough, case discussed, including:  HPI, pertinent PM/SHx, VS/PE, dx testing, ED course and treatment:  Agreeable to observation admit, requests to write temporary orders, obtain tele bed to team 2.           Laray Anger, DO 11/08/12 1951

## 2012-11-08 NOTE — Assessment & Plan Note (Signed)
Fairly normal Echo in 2011, EF 65%, concern for new onset CHF as well

## 2012-11-08 NOTE — ED Notes (Signed)
At PCP this am for evaluation of leg swelling. Pt legs were better but PCP sent for evaluation of new onset Atril fibrillation

## 2012-11-08 NOTE — Assessment & Plan Note (Signed)
Decrease to in Feb, this can be rechecked during admission

## 2012-11-08 NOTE — Patient Instructions (Addendum)
Go directly to ER Atrial fibrillation ,new onset

## 2012-11-08 NOTE — Progress Notes (Signed)
  Subjective:    Patient ID: [name], female    DOB: [birth year], 79 y.o.   MRN: [id number]  HPI  Patient presents to followup ER visit for shortness of breath and leg swelling. She was seen on March 20 in the ER secondary to worsening leg swelling her family states that her legs or twice the normal size. She was supposed to be taking edema next 40 mg daily which is double her typical dose however she did not do this. See previous notes. In the few days she has been taking 40 mg daily however she did not take it this morning. She tells me when she takes her pulse and feels irregular in that it stops. She also notes that she is short of breath with little movement and feels weak and tired.  Review of Systems - per above    GEN- + fatigue, fever, weight loss,weakness, recent illness HEENT- denies eye drainage, change in vision, nasal discharge, CVS- denies chest pain, +palpitations RESP- + SOB, cough, wheeze ABD- denies N/V, change in stools, abd pain GU- denies dysuria, hematuria, dribbling, incontinence MSK- denies joint pain, muscle aches, injury Neuro- denies headache, dizziness, syncope, seizure activity      Objective:   Physical Exam GEN- NAD, alert and oriented x3, + weight gain HEENT- PERRL, EOMI, non injected sclera, pink conjunctiva, MMM, oropharynx clear Neck- Supple, JVD 3cm CVS- irregular rhythm, irregular rate RESP-bibasilar crackles, normal WOB at rest , no wheeze EXT- 1+ pitting edema  Pulses- Radial 2+   EKG- A fib RVR HR 115, RBBB      Assessment & Plan:

## 2012-11-08 NOTE — Assessment & Plan Note (Signed)
New onset a fib, HR is up and down, with DOE and age , worsening leg swelling send to ER for admission

## 2012-11-08 NOTE — ED Notes (Signed)
Report called to Abby, RN on unit 300. 

## 2012-11-08 NOTE — H&P (Signed)
[facility] History and Physical  [name] ZOX:[id number] DOB: 06-Mar-1919 DOA: 11/08/2012  Referring physician: Dr. Clarene Duke PCP: Milinda Antis, MD  Specialists:   Chief Complaint: palpitations, dyspnea on exertion  HPI: [name] is a 79 y.o. female who presents to the emergency room with shortness of breath and worsening lower extremity edema. Patient reports that her symptoms have been somewhat chronic, but appear to have worsened over the past week. She reports experiencing dyspnea on exertion, which resolves with rest. She denies any orthopnea. Denies any cough or fever. She's also been experiencing palpitations was also get worse on exertion. She's had occasional chest pain, nothing recently. She has noted that she is having worsening lower extremity edema. She has been prescribed a diuretic, but admits to not taking it regularly. Over the past few days she has doubled her dose, but then reports that her lower extremity edema has progressively gotten worse. She went to her primary care physician's office and was subsequently sent to the emergency room for evaluation. In the ER she was noted to be in atrial fibrillation with a controlled rate. She was also noted to have significant lower extremity edema which was pitting. She was ambulated and was found to have a heart rate going into the 120s while becoming extremely short of breath. She is somewhat deconditioned and requires assistance in ambulating with a walker. She's been referred for admission. She's noticed her on any new medications lately. Review of record indicates that her Synthroid dose was recently adjusted.  Review of Systems: Pertinent positives as per HPI, otherwise negative  Past Medical History  Diagnosis Date  . Myocardial infarction     history of  . Coronary artery disease   . Leg edema     left  . Aneurysm     hx of  . COPD (chronic obstructive pulmonary disease)   . Hypertension   . Pulmonary  nodule   . Obesity   . Anxiety   . Depression   . Hemoptysis   . Pressure ulcer of back     lower  . Tobacco abuse   . DJD (degenerative joint disease)   . Panic disorder   . Urinary incontinence   . Seizure disorder   . Ganglion cyst     left wrist  . Hypothyroidism   . Leg edema   . DDD (degenerative disc disease), lumbosacral   . Seizures    Past Surgical History  Procedure Laterality Date  . Appendectomy    . Cholecystectomy    . Replacement total knee  11/2000    right   . Angioplasty      times 2   Social History:  reports that she has been smoking Cigarettes.  She has been smoking about 0.00 packs per day. She has never used smokeless tobacco. She reports that she does not drink alcohol or use illicit drugs. Lives at home, walks with walker  No Known Allergies  Family history: mother lived to be 75 and had hypertension, DM and CAD, father lived to be 67 and had heart disease  Prior to Admission medications   Medication Sig Start Date End Date Taking? Authorizing Provider  acetaminophen (TYLENOL) 325 MG tablet Take 2 tablets (650 mg total) by mouth every 4 (four) hours as needed (or Fever >/= 101). 04/26/12 04/26/13 Yes Christiane Ha, MD  ALPRAZolam Prudy Feeler) 0.25 MG tablet Take 1 tablet (0.25 mg total) by mouth at bedtime as needed for sleep. 07/08/12  Yes Salley Scarlet, MD  aspirin EC 81 MG tablet Take 81 mg by mouth daily.   Yes Historical Provider, MD  ezetimibe-simvastatin (VYTORIN) 10-80 MG per tablet Take 1 tablet by mouth at bedtime.     Yes Historical Provider, MD  folic acid (FOLVITE) 1 MG tablet Take 1 tablet (1 mg total) by mouth daily. 07/08/12  Yes Salley Scarlet, MD  HYDROcodone-acetaminophen (NORCO/VICODIN) 5-325 MG per tablet Take 1 tablet by mouth every 6 (six) hours as needed for pain. 07/08/12  Yes Salley Scarlet, MD  levETIRAcetam (KEPPRA) 250 MG tablet Take 1 tablet (250 mg total) by mouth 2 (two) times daily. 07/08/12  Yes Salley Scarlet, MD  levothyroxine (SYNTHROID, LEVOTHROID) 150 MCG tablet Take 1 tablet (150 mcg total) by mouth daily. 10/28/12  Yes Salley Scarlet, MD  neomycin-polymyxin-hydrocortisone (CORTISPORIN) 3.5-10000-1 otic suspension Place 4 drops in ear(s) 3 (three) times daily. 10/26/12  Yes Gerhard Munch, MD  Olopatadine HCl 0.2 % SOLN Place 1 drop into both eyes daily as needed. 1 drop each eye daily as needed(dry eyes) 10/08/12  Yes Salley Scarlet, MD  potassium chloride (K-DUR) 10 MEQ tablet Take 1 tablet (10 mEq total) by mouth daily. 07/08/12  Yes Salley Scarlet, MD  torsemide (DEMADEX) 20 MG tablet Take 1 tablet (20 mg total) by mouth daily. For leg swelling, take with potassium 07/08/12  Yes Salley Scarlet, MD  diclofenac sodium (VOLTAREN) 1 % GEL Apply 2 g topically every 6 (six) hours as needed (pain). 10/08/12   Salley Scarlet, MD   Physical Exam: Filed Vitals:   11/08/12 1431 11/08/12 1537 11/08/12 1600 11/08/12 1732  BP: 128/62 126/67 126/75   Pulse: 80 74 62   Temp: 97.7 F (36.5 C)     TempSrc: Oral     Resp: 20  15   Height:    5\' 8"  (1.727 m)  Weight:    91.3 kg (201 lb 4.5 oz)  SpO2: 100% 97% 97%      General:  NAD  Eyes: PERRLA  ENT: moist mucous membranes  Neck: supple  Cardiovascular: S1, s2, irregular  Respiratory: CTA B  Abdomen: soft, nt, nd, bs+  Skin: no rashes  Musculoskeletal: 1-2+ edema b/l  Psychiatric: normal affect, cooperative with exam  Neurologic: grossly intact, nonfocal, generally weak  Labs on Admission:  Basic Metabolic Panel:  Recent Labs Lab 11/08/12 1308  NA 141  K 4.0  CL 102  CO2 28  GLUCOSE 89  BUN 19  CREATININE 1.06  CALCIUM 10.0   Liver Function Tests: No results found for this basename: AST, ALT, ALKPHOS, BILITOT, PROT, ALBUMIN,  in the last 168 hours No results found for this basename: LIPASE, AMYLASE,  in the last 168 hours No results found for this basename: AMMONIA,  in the last 168 hours CBC:  Recent  Labs Lab 11/08/12 1308  WBC 6.3  NEUTROABS 2.5  HGB 13.5  HCT 41.2  MCV 88.4  PLT 257   Cardiac Enzymes:  Recent Labs Lab 11/08/12 1308  TROPONINI <0.30    BNP (last 3 results)  Recent Labs  10/31/12 1910 11/08/12 1308  PROBNP 3114.0* 2516.0*   CBG: No results found for this basename: GLUCAP,  in the last 168 hours  Radiological Exams on Admission: Dg Chest Port 1 View  11/08/2012  *RADIOLOGY REPORT*  Clinical Data: Atrial fibrillation  PORTABLE CHEST - 1 VIEW  Comparison: 10/31/2012  Findings: The cardiac shadow is mildly  enlarged.  The left hemidiaphragm remains mildly elevated.  No focal infiltrate or sizable effusion is seen.  Minimal left basilar atelectasis is noted.  IMPRESSION: No change from prior exam.   Original Report Authenticated By: Alcide Clever, M.D.     EKG: Independently reviewed. Atrial fib with controlled rate, RBBB  Assessment/Plan Active Problems:   HYPOTHYROIDISM   HYPERTENSION   CORONARY ARTERY DISEASE   COPD   SEIZURE DISORDER   Debility   Atrial fibrillation   Dyspnea on exertion   Acute CHF   UTI (urinary tract infection)   1. Acute CHF. Patient will be started on IV Lasix for diuresis. We will monitor ins and outs and daily weights. We'll check 2-D echocardiogram for ejection fraction. Her most recent echocardiogram was in 2011 which showed a normal ejection fraction. She will likely need followup with cardiology. She has seen Dr. Daleen Squibb in the past. With her intermittent chest pain, will check cardiac markers. 2. Atrial fibrillation. This appears to be new finding. Patient's chads score is at least 3. We will start on Lovenox and Coumadin for anticoagulation. Started on beta blocker for further rate control, although she is not tachycardic at this point. Check thyroid studies. 3. UTI. Urinalysis indicates urinary tract infection. We will start the patient empirically on Rocephin check urine culture. 4. Seizure disorder. Continue  outpatient dose of Keppra. 5. COPD, stable 6. Debility. We'll get physical therapy consult to assess level of physical therapy that will need on discharge. She may need skilled nursing facility placement.  Code Status: DNR Family Communication: discussed with patient and daughters at bedside Disposition Plan: pending hospital course  Time spent:  [facility] Pager [phone]  If 7PM-7AM, please contact night-coverage [email or web address] Password [facility] 11/08/2012, 5:52 PM

## 2012-11-08 NOTE — ED Notes (Signed)
Pt ambulated to restroom with total assistance at this time. Pt 's o2 stayed at 95% but pt is not mobile at all. Urine was collected and sent for lab testing. [name]

## 2012-11-08 NOTE — Telephone Encounter (Signed)
Noted.  Reviewed by physician.

## 2012-11-09 LAB — BASIC METABOLIC PANEL
BUN: 20 mg/dL (ref 6–23)
Calcium: 9.5 mg/dL (ref 8.4–10.5)
Creatinine, Ser: 1.05 mg/dL (ref 0.50–1.10)
GFR calc Af Amer: 52 mL/min — ABNORMAL LOW (ref >90)
GFR calc non Af Amer: 45 mL/min — ABNORMAL LOW (ref >90)
Glucose, Bld: 89 mg/dL (ref 70–99)

## 2012-11-09 LAB — CBC
HCT: 39 % (ref 36.0–46.0)
Hemoglobin: 12.6 g/dL (ref 12.0–15.0)
WBC: 6 10*3/uL (ref 4.0–10.5)

## 2012-11-09 LAB — PROTIME-INR
INR: 1.07 (ref 0.00–1.49)
Prothrombin Time: 13.8 seconds (ref 11.6–15.2)

## 2012-11-09 LAB — TROPONIN I: Troponin I: <0.3 ng/mL (ref <0.30)

## 2012-11-09 MED ORDER — LEVOTHYROXINE SODIUM 25 MCG PO TABS
125.0000 ug | ORAL_TABLET | Freq: Every day | ORAL | Status: DC
  Administered 2012-11-10 – 2012-11-11 (×2): 125 ug via ORAL
  Filled 2012-11-09 (×2): qty 1

## 2012-11-09 MED ORDER — WARFARIN VIDEO
Freq: Once | Status: AC
  Administered 2012-11-09: 1

## 2012-11-09 MED ORDER — WARFARIN SODIUM 5 MG PO TABS
5.0000 mg | ORAL_TABLET | Freq: Once | ORAL | Status: AC
  Administered 2012-11-09: 5 mg via ORAL
  Filled 2012-11-09: qty 1

## 2012-11-09 MED ORDER — PATIENT'S GUIDE TO USING COUMADIN BOOK
Freq: Once | Status: DC
  Filled 2012-11-09: qty 1

## 2012-11-09 MED ORDER — FUROSEMIDE 10 MG/ML IJ SOLN
40.0000 mg | Freq: Two times a day (BID) | INTRAMUSCULAR | Status: DC
  Administered 2012-11-10 – 2012-11-11 (×3): 40 mg via INTRAVENOUS
  Filled 2012-11-09 (×3): qty 4

## 2012-11-09 NOTE — Progress Notes (Signed)
ANTICOAGULATION CONSULT NOTE   Pharmacy Consult for Lovenox and Coumadin Indication: atrial fibrillation  No Known Allergies  Patient Measurements: Height: 5\' 8"  (172.7 cm) Weight: 201 lb 4.5 oz (91.3 kg) IBW/kg (Calculated) : 63.9  Vital Signs: Temp: 97.8 F (36.6 C) (03/29 0500) Temp src: Oral (03/29 0500) BP: 103/70 mmHg (03/29 0500) Pulse Rate: 75 (03/29 0500)  Labs:  Recent Labs  11/08/12 1308 11/08/12 1809 11/08/12 2306 11/09/12 0753  HGB 13.5  --   --  12.6  HCT 41.2  --   --  39.0  PLT 257  --   --  250  LABPROT  --   --  13.2 13.8  INR  --   --  1.01 1.07  CREATININE 1.06  --   --  1.05  TROPONINI <0.30 <0.30 <0.30 <0.30    Estimated Creatinine Clearance: 41.3 ml/min (by C-G formula based on Cr of 1.05).   Medical History: Past Medical History  Diagnosis Date  . Myocardial infarction     history of  . Coronary artery disease   . Leg edema     left  . Aneurysm     hx of  . COPD (chronic obstructive pulmonary disease)   . Hypertension   . Pulmonary nodule   . Obesity   . Anxiety   . Depression   . Hemoptysis   . Pressure ulcer of back     lower  . Tobacco abuse   . DJD (degenerative joint disease)   . Panic disorder   . Urinary incontinence   . Seizure disorder   . Ganglion cyst     left wrist  . Hypothyroidism   . Leg edema   . DDD (degenerative disc disease), lumbosacral   . Seizures    Medications:  Scheduled:  . aspirin EC  81 mg Oral Daily  . ezetimibe  10 mg Oral QHS   And  . atorvastatin  40 mg Oral QHS  . carvedilol  3.125 mg Oral BID WC  . [COMPLETED] cefTRIAXone (ROCEPHIN)  IV  1 g Intravenous Once  . cefTRIAXone (ROCEPHIN)  IV  1 g Intravenous Q24H  . enoxaparin (LOVENOX) injection  90 mg Subcutaneous Q12H  . folic acid  1 mg Oral Daily  . [COMPLETED] furosemide  40 mg Intravenous Once  . furosemide  40 mg Oral BID  . levETIRAcetam  250 mg Oral BID  . levothyroxine  150 mcg Oral QAC breakfast  . lisinopril  5 mg  Oral Daily  . potassium chloride  40 mEq Oral Daily  . sodium chloride  3 mL Intravenous Q12H  . [COMPLETED] warfarin  5 mg Oral Once  . warfarin  5 mg Oral Once  . Warfarin - Pharmacist Dosing Inpatient   Does not apply q1800  . [DISCONTINUED] ezetimibe-simvastatin  1 tablet Oral QHS   Assessment: 79yo female admitted with new onset afib.  Pt has clcr > 40. Goal of Therapy:  INR 2-3 Monitor platelets by anticoagulation protocol: Yes   Plan: Lovenox 1mg /Kg SQ q12hrs until INR therapeutic Coumadin 5mg  today x 1 INR daily CBC 3 times weekly  Margo Aye, Zayah Keilman A 11/09/2012,9:36 AM

## 2012-11-09 NOTE — Progress Notes (Signed)
[facility] PROGRESS NOTE  [name] JYN:[id number] DOB: 1919/08/04 DOA: 11/08/2012 PCP: Milinda Antis, MD  Assessment/Plan: 1. Acute CHF. Patient is started on IV Lasix. Accurate input/output has not been reported. Weight has not been recorded for today.. 2-D echocardiogram is pending.Marland Kitchen Her most recent echocardiogram was in 2011 which showed a normal ejection fraction. She will likely need followup with cardiology. She has seen Dr. Daleen Squibb in the past. Cardiac markers are found to be negative. Continue diuresis for now. 2. Atrial fibrillation. This appears to be new finding. Patient's chads score is at least 3. We will start on Lovenox and Coumadin for anticoagulation. Started on beta blocker for further rate control, although she is not tachycardic at this point.  3. UTI. Urinalysis indicates urinary tract infection. Patient is currently on Rocephin. Followup urine culture. 4. Seizure disorder. Continue outpatient dose of Keppra. 5. COPD, stable 6. Debility. We'll get physical therapy consult to assess level of physical therapy that will need on discharge. She may need skilled nursing facility placement. 7. Hypothyroidism. TSH is low, although Synthroid was adjusted on previous doctor's visit. We will decrease dose from 150-125 mcg. She'll need repeat thyroid studies in 4-6 weeks.  Code Status: DNR Family Communication: disscused with patient and son at the bedside Disposition Plan: To be determined   Consultants:  none  Procedures:  none  Antibiotics:  none  HPI/Subjective: Patient feels she is improving today. She feels her breathing is also better today. She was lower extremity edema is also improving  Objective: Filed Vitals:   11/09/12 0500 11/09/12 1107 11/09/12 1108 11/09/12 1406  BP: 103/70 108/70  106/72  Pulse: 75  70 61  Temp: 97.8 F (36.6 C)   97.9 F (36.6 C)  TempSrc: Oral   Oral  Resp: 20   20  Height:      Weight:      SpO2: 90%   96%     Intake/Output Summary (Last 24 hours) at 11/09/12 1733 Last data filed at 11/09/12 1200  Gross per 24 hour  Intake    720 ml  Output    500 ml  Net    220 ml   Filed Weights   11/08/12 1259 11/08/12 1732  Weight: 92.534 kg (204 lb) 91.3 kg (201 lb 4.5 oz)    Exam:   General:  NAD  Cardiovascular: S1, S2, irregular  Respiratory: Clear to auscultation bilaterally  Abdomen: Soft, nontender, nondistended, bowel sounds are active  Musculoskeletal: One plus edema bilaterally, improving   Data Reviewed: Basic Metabolic Panel:  Recent Labs Lab 11/08/12 1308 11/09/12 0753  NA 141 137  K 4.0 4.6  CL 102 100  CO2 28 29  GLUCOSE 89 89  BUN 19 20  CREATININE 1.06 1.05  CALCIUM 10.0 9.5   Liver Function Tests: No results found for this basename: AST, ALT, ALKPHOS, BILITOT, PROT, ALBUMIN,  in the last 168 hours No results found for this basename: LIPASE, AMYLASE,  in the last 168 hours No results found for this basename: AMMONIA,  in the last 168 hours CBC:  Recent Labs Lab 11/08/12 1308 11/09/12 0753  WBC 6.3 6.0  NEUTROABS 2.5  --   HGB 13.5 12.6  HCT 41.2 39.0  MCV 88.4 88.8  PLT 257 250   Cardiac Enzymes:  Recent Labs Lab 11/08/12 1308 11/08/12 1809 11/08/12 2306 11/09/12 0753  TROPONINI <0.30 <0.30 <0.30 <0.30   BNP (last 3 results)  Recent Labs  10/31/12 1910 11/08/12 1308  PROBNP 3114.0* 2516.0*   CBG: No results found for this basename: GLUCAP,  in the last 168 hours  Recent Results (from the past 240 hour(s))  URINE CULTURE     Status: None   Collection Time    10/31/12  8:05 PM      Result Value Range Status   Specimen Description URINE, CLEAN CATCH   Final   Special Requests NONE   Final   Culture  Setup Time 10/31/2012 21:00   Final   Colony Count 2,000 COLONIES/ML   Final   Culture INSIGNIFICANT GROWTH   Final   Report Status 11/02/2012 FINAL   Final     Studies: Dg Chest Port 1 View  11/08/2012  *RADIOLOGY REPORT*   Clinical Data: Atrial fibrillation  PORTABLE CHEST - 1 VIEW  Comparison: 10/31/2012  Findings: The cardiac shadow is mildly enlarged.  The left hemidiaphragm remains mildly elevated.  No focal infiltrate or sizable effusion is seen.  Minimal left basilar atelectasis is noted.  IMPRESSION: No change from prior exam.   Original Report Authenticated By: Alcide Clever, M.D.     Scheduled Meds: . aspirin EC  81 mg Oral Daily  . ezetimibe  10 mg Oral QHS   And  . atorvastatin  40 mg Oral QHS  . carvedilol  3.125 mg Oral BID WC  . cefTRIAXone (ROCEPHIN)  IV  1 g Intravenous Q24H  . enoxaparin (LOVENOX) injection  90 mg Subcutaneous Q12H  . folic acid  1 mg Oral Daily  . furosemide  40 mg Oral BID  . levETIRAcetam  250 mg Oral BID  . levothyroxine  150 mcg Oral QAC breakfast  . lisinopril  5 mg Oral Daily  . patient's guide to using coumadin book   Does not apply Once  . potassium chloride  40 mEq Oral Daily  . sodium chloride  3 mL Intravenous Q12H  . warfarin  5 mg Oral Once  . Warfarin - Pharmacist Dosing Inpatient   Does not apply q1800   Continuous Infusions:   Active Problems:   HYPOTHYROIDISM   HYPERTENSION   CORONARY ARTERY DISEASE   COPD   SEIZURE DISORDER   Debility   Atrial fibrillation   Dyspnea on exertion   Acute CHF   UTI (urinary tract infection)    Time spent:    MEMON,JEHANZEB  [facility] Pager [phone]. If 7PM-7AM, please contact night-coverage at [email or web address], password [facility] 11/09/2012, 5:33 PM  LOS: 1 day

## 2012-11-10 DIAGNOSIS — D649 Anemia, unspecified: Secondary | ICD-10-CM

## 2012-11-10 LAB — BASIC METABOLIC PANEL
CO2: 29 mEq/L (ref 19–32)
GFR calc non Af Amer: 38 mL/min — ABNORMAL LOW (ref >90)
Glucose, Bld: 81 mg/dL (ref 70–99)
Potassium: 4.5 mEq/L (ref 3.5–5.1)
Sodium: 135 mEq/L (ref 135–145)

## 2012-11-10 LAB — PROTIME-INR
INR: 1.14 (ref 0.00–1.49)
Prothrombin Time: 14.4 s (ref 11.6–15.2)

## 2012-11-10 MED ORDER — NITROGLYCERIN 0.4 MG SL SUBL: 0.4000 mg | SUBLINGUAL_TABLET | SUBLINGUAL | Status: DC | PRN

## 2012-11-10 MED ORDER — WARFARIN SODIUM 5 MG PO TABS
5.0000 mg | ORAL_TABLET | Freq: Once | ORAL | Status: AC
  Administered 2012-11-10: 5 mg via ORAL
  Filled 2012-11-10: qty 1

## 2012-11-10 NOTE — Progress Notes (Addendum)
[facility] PROGRESS NOTE  SAYDIE GERDTS GNF:[id number] DOB: [birth year] DOA: 11/08/2012 PCP: Milinda Antis, MD  Assessment/Plan: 1. Acute CHF. Patient is on IV Lasix. 2-D echocardiogram is pending. Clinically, her volume status is improving. Her most recent echocardiogram was in 2011 which showed a normal ejection fraction. She will likely need followup with cardiology. She has seen Dr. Daleen Squibb in the past. Cardiac markers are found to be negative. Continue diuresis for now. 2. Atrial fibrillation. This appears to be new finding. Patient's chads score is at least 3. We will start on Lovenox and Coumadin for anticoagulation. Started on beta blocker for further rate control, although she is not tachycardic at this point.  3. UTI. Urinalysis indicates urinary tract infection, but urine culture did not show any significant growth. We'll discontinue Rocephin. 4. Seizure disorder. Continue outpatient dose of Keppra. 5. COPD, stable 6. Debility. We'll get physical therapy consult to assess level of physical therapy that will need on discharge. She may need skilled nursing facility placement. 7. Hypothyroidism. TSH is low, although Synthroid was adjusted on previous doctor's visit. We will decrease dose from 150-125 mcg. She'll need repeat thyroid studies in 4-6 weeks. 8. Chest pain. Patient complained of chest pain earlier today. Upon my arrival, her symptoms had resolved. EKG was unremarkable. We'll continue to follow.   Code Status: DNR Family Communication: disscused with patient and son at the bedside Disposition Plan: To be determined   Consultants:  none  Procedures:  none  Antibiotics:  Rocephin 3/28 - 3/30  HPI/Subjective: Patient feels she is improving today. She feels her breathing is also better today. She was lower extremity edema is also improving  Objective: Filed Vitals:   11/10/12 0500 11/10/12 0545 11/10/12 1015 11/10/12 1417  BP:  128/62 110/71 101/65  Pulse:   66 73 63  Temp:  98 F (36.7 C) 97.2 F (36.2 C) 97.4 F (36.3 C)  TempSrc:  Oral Oral Axillary  Resp:  22 20 20   Height:      Weight: 91.2 kg (201 lb 1 oz)     SpO2:  98% 93% 94%    Intake/Output Summary (Last 24 hours) at 11/10/12 1535 Last data filed at 11/10/12 1300  Gross per 24 hour  Intake    840 ml  Output   1700 ml  Net   -860 ml   Filed Weights   11/08/12 1259 11/08/12 1732 11/10/12 0500  Weight: 92.534 kg (204 lb) 91.3 kg (201 lb 4.5 oz) 91.2 kg (201 lb 1 oz)    Exam:   General:  NAD  Cardiovascular: S1, S2, irregular  Respiratory: Clear to auscultation bilaterally  Abdomen: Soft, nontender, nondistended, bowel sounds are active  Musculoskeletal: One plus edema bilaterally, improving   Data Reviewed: Basic Metabolic Panel:  Recent Labs Lab 11/08/12 1308 11/09/12 0753 11/10/12 0435  NA 141 137 135  K 4.0 4.6 4.5  CL 102 100 100  CO2 28 29 29   GLUCOSE 89 89 81  BUN 19 20 27*  CREATININE 1.06 1.05 1.22*  CALCIUM 10.0 9.5 9.4   Liver Function Tests: No results found for this basename: AST, ALT, ALKPHOS, BILITOT, PROT, ALBUMIN,  in the last 168 hours No results found for this basename: LIPASE, AMYLASE,  in the last 168 hours No results found for this basename: AMMONIA,  in the last 168 hours CBC:  Recent Labs Lab 11/08/12 1308 11/09/12 0753  WBC 6.3 6.0  NEUTROABS 2.5  --   HGB 13.5 12.6  HCT 41.2 39.0  MCV 88.4 88.8  PLT 257 250   Cardiac Enzymes:  Recent Labs Lab 11/08/12 1308 11/08/12 1809 11/08/12 2306 11/09/12 0753  TROPONINI <0.30 <0.30 <0.30 <0.30   BNP (last 3 results)  Recent Labs  10/31/12 1910 11/08/12 1308  PROBNP 3114.0* 2516.0*   CBG: No results found for this basename: GLUCAP,  in the last 168 hours  Recent Results (from the past 240 hour(s))  URINE CULTURE     Status: None   Collection Time    10/31/12  8:05 PM      Result Value Range Status   Specimen Description URINE, CLEAN CATCH   Final    Special Requests NONE   Final   Culture  Setup Time 10/31/2012 21:00   Final   Colony Count 2,000 COLONIES/ML   Final   Culture INSIGNIFICANT GROWTH   Final   Report Status 11/02/2012 FINAL   Final     Studies: No results found.  Scheduled Meds: . aspirin EC  81 mg Oral Daily  . ezetimibe  10 mg Oral QHS   And  . atorvastatin  40 mg Oral QHS  . carvedilol  3.125 mg Oral BID WC  . cefTRIAXone (ROCEPHIN)  IV  1 g Intravenous Q24H  . enoxaparin (LOVENOX) injection  90 mg Subcutaneous Q12H  . folic acid  1 mg Oral Daily  . furosemide  40 mg Intravenous Q12H  . levETIRAcetam  250 mg Oral BID  . levothyroxine  125 mcg Oral QAC breakfast  . lisinopril  5 mg Oral Daily  . patient's guide to using coumadin book   Does not apply Once  . potassium chloride  40 mEq Oral Daily  . sodium chloride  3 mL Intravenous Q12H  . warfarin  5 mg Oral Once  . Warfarin - Pharmacist Dosing Inpatient   Does not apply q1800   Continuous Infusions:   Active Problems:   HYPOTHYROIDISM   HYPERTENSION   CORONARY ARTERY DISEASE   COPD   SEIZURE DISORDER   Debility   Atrial fibrillation   Dyspnea on exertion   Acute CHF   UTI (urinary tract infection)    Time spent:    MEMON,JEHANZEB  [facility] Pager [phone]. If 7PM-7AM, please contact night-coverage at [email or web address], password [facility] 11/10/2012, 3:35 PM  LOS: 2 days

## 2012-11-10 NOTE — Progress Notes (Signed)
Notified Dr. Kerry Hough due to patient's complaint of chest pain.  The patient could not describe or score the pain at the time but she was clutching her chest.  Vital signs completed. Chest pain protocol was initiated.   By the time I got to her room after initiating the protocal the pain was relieved and a nitroglycerin was not needed.  The patient now rates pain as "a little" and a tylenol was given.  MD was given the EKG sheet and voiced to him the patients vitals were stable.  No new orders were given at this time.

## 2012-11-10 NOTE — Progress Notes (Signed)
ANTICOAGULATION CONSULT NOTE   Pharmacy Consult for Lovenox and Coumadin Indication: atrial fibrillation  No Known Allergies  Patient Measurements: Height: 5\' 8"  (172.7 cm) Weight: 201 lb 1 oz (91.2 kg) IBW/kg (Calculated) : 63.9  Vital Signs: Temp: 98 F (36.7 C) (03/30 0545) Temp src: Oral (03/30 0545) BP: 128/62 mmHg (03/30 0545) Pulse Rate: 66 (03/30 0545)  Labs:  Recent Labs  11/08/12 1308 11/08/12 1809 11/08/12 2306 11/09/12 0753 11/10/12 0435  HGB 13.5  --   --  12.6  --   HCT 41.2  --   --  39.0  --   PLT 257  --   --  250  --   LABPROT  --   --  13.2 13.8 14.4  INR  --   --  1.01 1.07 1.14  CREATININE 1.06  --   --  1.05 1.22*  TROPONINI <0.30 <0.30 <0.30 <0.30  --    Estimated Creatinine Clearance: 35.5 ml/min (by C-G formula based on Cr of 1.22).  Medical History: Past Medical History  Diagnosis Date  . Myocardial infarction     history of  . Coronary artery disease   . Leg edema     left  . Aneurysm     hx of  . COPD (chronic obstructive pulmonary disease)   . Hypertension   . Pulmonary nodule   . Obesity   . Anxiety   . Depression   . Hemoptysis   . Pressure ulcer of back     lower  . Tobacco abuse   . DJD (degenerative joint disease)   . Panic disorder   . Urinary incontinence   . Seizure disorder   . Ganglion cyst     left wrist  . Hypothyroidism   . Leg edema   . DDD (degenerative disc disease), lumbosacral   . Seizures    Medications:  Scheduled:  . aspirin EC  81 mg Oral Daily  . ezetimibe  10 mg Oral QHS   And  . atorvastatin  40 mg Oral QHS  . carvedilol  3.125 mg Oral BID WC  . cefTRIAXone (ROCEPHIN)  IV  1 g Intravenous Q24H  . enoxaparin (LOVENOX) injection  90 mg Subcutaneous Q12H  . folic acid  1 mg Oral Daily  . furosemide  40 mg Intravenous Q12H  . levETIRAcetam  250 mg Oral BID  . levothyroxine  125 mcg Oral QAC breakfast  . lisinopril  5 mg Oral Daily  . patient's guide to using coumadin book   Does not  apply Once  . potassium chloride  40 mEq Oral Daily  . sodium chloride  3 mL Intravenous Q12H  . [COMPLETED] warfarin  5 mg Oral Once  . warfarin  5 mg Oral Once  . [COMPLETED] warfarin   Does not apply Once  . Warfarin - Pharmacist Dosing Inpatient   Does not apply q1800  . [DISCONTINUED] furosemide  40 mg Oral BID  . [DISCONTINUED] levothyroxine  150 mcg Oral QAC breakfast   Assessment: 79yo female admitted with new onset afib.  SCr is starting to rise 1.06 on admission >>> 1.22 today.  Pt has clcr > 30. Goal of Therapy:  INR 2-3 Monitor platelets by anticoagulation protocol: Yes   Plan: Lovenox 1mg /Kg SQ q12hrs until INR therapeutic Coumadin 5mg  today x 1 INR daily CBC 3 times weekly  Margo Aye, Kyerra Vargo A 11/10/2012,7:56 AM

## 2012-11-11 DIAGNOSIS — N390 Urinary tract infection, site not specified: Secondary | ICD-10-CM

## 2012-11-11 DIAGNOSIS — I369 Nonrheumatic tricuspid valve disorder, unspecified: Secondary | ICD-10-CM

## 2012-11-11 LAB — URINE CULTURE

## 2012-11-11 LAB — BASIC METABOLIC PANEL
CO2: 29 mEq/L (ref 19–32)
Calcium: 9.6 mg/dL (ref 8.4–10.5)
Chloride: 98 mEq/L (ref 96–112)
Glucose, Bld: 104 mg/dL — ABNORMAL HIGH (ref 70–99)
Sodium: 135 mEq/L (ref 135–145)

## 2012-11-11 LAB — CBC
HCT: 39 % (ref 36.0–46.0)
Hemoglobin: 12.4 g/dL (ref 12.0–15.0)
MCHC: 31.8 g/dL (ref 30.0–36.0)
RBC: 4.38 MIL/uL (ref 3.87–5.11)

## 2012-11-11 LAB — PROTIME-INR: INR: 1.37 (ref 0.00–1.49)

## 2012-11-11 MED ORDER — APIXABAN 2.5 MG PO TABS: 2.5000 mg | ORAL_TABLET | Freq: Two times a day (BID) | ORAL | Status: DC

## 2012-11-11 MED ORDER — DEXTROSE 5 % IV SOLN
1.0000 g | INTRAVENOUS | Status: DC
  Filled 2012-11-11: qty 10

## 2012-11-11 MED ORDER — AMOXICILLIN-POT CLAVULANATE 500-125 MG PO TABS: 1.0000 | ORAL_TABLET | Freq: Three times a day (TID) | ORAL | Status: DC

## 2012-11-11 MED ORDER — CARVEDILOL 3.125 MG PO TABS: 3.1250 mg | ORAL_TABLET | Freq: Two times a day (BID) | ORAL | Status: DC

## 2012-11-11 MED ORDER — LEVOTHYROXINE SODIUM 125 MCG PO TABS: 125.0000 ug | ORAL_TABLET | Freq: Every day | ORAL | Status: DC

## 2012-11-11 MED ORDER — WARFARIN SODIUM 7.5 MG PO TABS
7.5000 mg | ORAL_TABLET | Freq: Once | ORAL | Status: DC
Start: 2012-11-11 — End: 2012-11-11

## 2012-11-11 NOTE — Progress Notes (Signed)
ANTICOAGULATION CONSULT NOTE   Pharmacy Consult for Lovenox and Coumadin Indication: atrial fibrillation  No Known Allergies  Patient Measurements: Height: 5\' 8"  (172.7 cm) Weight: 207 lb 3.7 oz (94 kg) IBW/kg (Calculated) : 63.9  Vital Signs: Temp: 97.7 F (36.5 C) (03/31 0550) Temp src: Oral (03/31 0550) BP: 110/73 mmHg (03/31 0550) Pulse Rate: 67 (03/31 0550)  Labs:  Recent Labs  11/08/12 1308 11/08/12 1809  11/08/12 2306 11/09/12 0753 11/10/12 0435 11/11/12 0508  HGB 13.5  --   --   --  12.6  --  12.4  HCT 41.2  --   --   --  39.0  --  39.0  PLT 257  --   --   --  250  --  221  LABPROT  --   --   < > 13.2 13.8 14.4 16.5*  INR  --   --   < > 1.01 1.07 1.14 1.37  CREATININE 1.06  --   --   --  1.05 1.22* 1.22*  TROPONINI <0.30 <0.30  --  <0.30 <0.30  --   --   < > = values in this interval not displayed. Estimated Creatinine Clearance: 36 ml/min (by C-G formula based on Cr of 1.22).  Medical History: Past Medical History  Diagnosis Date  . Myocardial infarction     history of  . Coronary artery disease   . Leg edema     left  . Aneurysm     hx of  . COPD (chronic obstructive pulmonary disease)   . Hypertension   . Pulmonary nodule   . Obesity   . Anxiety   . Depression   . Hemoptysis   . Pressure ulcer of back     lower  . Tobacco abuse   . DJD (degenerative joint disease)   . Panic disorder   . Urinary incontinence   . Seizure disorder   . Ganglion cyst     left wrist  . Hypothyroidism   . Leg edema   . DDD (degenerative disc disease), lumbosacral   . Seizures    Medications:  Scheduled:  . aspirin EC  81 mg Oral Daily  . ezetimibe  10 mg Oral QHS   And  . atorvastatin  40 mg Oral QHS  . carvedilol  3.125 mg Oral BID WC  . enoxaparin (LOVENOX) injection  90 mg Subcutaneous Q12H  . folic acid  1 mg Oral Daily  . furosemide  40 mg Intravenous Q12H  . levETIRAcetam  250 mg Oral BID  . levothyroxine  125 mcg Oral QAC breakfast  .  lisinopril  5 mg Oral Daily  . patient's guide to using coumadin book   Does not apply Once  . potassium chloride  40 mEq Oral Daily  . sodium chloride  3 mL Intravenous Q12H  . [COMPLETED] warfarin  5 mg Oral Once  . warfarin  7.5 mg Oral Once  . Warfarin - Pharmacist Dosing Inpatient   Does not apply q1800  . [DISCONTINUED] cefTRIAXone (ROCEPHIN)  IV  1 g Intravenous Q24H   Assessment: 79yo female admitted with new onset afib.  SCr is starting to rise 1.06 on admission >>> 1.22 today.  Pt has clcr > 30.  Sluggish INR response, not yet at goal.  Goal of Therapy:  INR 2-3 Monitor platelets by anticoagulation protocol: Yes   Plan: Lovenox 1mg /Kg SQ q12hrs until INR therapeutic Coumadin 7.5mg  today x 1 INR daily CBC 3 times weekly Coumadin  education complete  Valrie Hart A 11/11/2012,10:47 AM

## 2012-11-11 NOTE — Evaluation (Signed)
Physical Therapy Evaluation Patient Details Name: VENDA DICE MRN: [id number] DOB: 1919-05-13 Today's Date: 11/11/2012 Time: 4098-1191 PT Time Calculation (min): 25 min  PT Assessment / Plan / Recommendation Clinical Impression  Pt was seen for evaluation and found to be at prior functional status...she reports feeling no current weakness and feels ready to go home.  She appears to have good support at home.    PT Assessment  Patent does not need any further PT services    Follow Up Recommendations  No PT follow up    Does the patient have the potential to tolerate intense rehabilitation      Barriers to Discharge        Equipment Recommendations       Recommendations for Other Services     Frequency      Precautions / Restrictions Precautions Precautions: None Restrictions Weight Bearing Restrictions: No   Pertinent Vitals/Pain       Mobility  Bed Mobility Bed Mobility: Supine to Sit;Sit to Supine Supine to Sit: 6: Modified independent (Device/Increase time);With rails Sit to Supine: 4: Min assist Details for Bed Mobility Assistance: Pt states that at home she is independent with transfers because of the way she has situated her scooter and furniture to assist Transfers Transfers: Sit to Stand;Stand to Sit Sit to Stand: 6: Modified independent (Device/Increase time);From bed Stand to Sit: 6: Modified independent (Device/Increase time);To bed Ambulation/Gait Ambulation/Gait Assistance: 6: Modified independent (Device/Increase time) Ambulation Distance (Feet): 120 Feet Assistive device: Rolling walker Gait Pattern: Within Functional Limits;Trunk flexed Gait velocity: WNL Stairs: No Wheelchair Mobility Wheelchair Mobility: No    Exercises     PT Diagnosis:    PT Problem List:   PT Treatment Interventions:     PT Goals    Visit Information  Last PT Received On: 11/11/12    Subjective Data  Subjective: I'm feeling much stronger.Marland KitchenMarland KitchenI'm ready to go  home   Prior Functioning  Home Living Lives With: Alone Available Help at Discharge: Personal care attendant;Family;Available 24 hours/day Type of Home: House Home Access: Ramped entrance Home Layout: One level Bathroom Shower/Tub: Engineer, manufacturing systems: Standard Home Adaptive Equipment: Bedside commode/3-in-1;Electric Scooter;Tub transfer bench;Walker - rolling Prior Function Level of Independence: Needs assistance Needs Assistance: Bathing;Dressing;Meal Prep;Light Housekeeping Able to Take Stairs?: No Driving: No Vocation: Retired Musician: No difficulties    Copywriter, advertising Overall Cognitive Status: Appears within functional limits for tasks assessed/performed Arousal/Alertness: Awake/alert Orientation Level: Appears intact for tasks assessed Behavior During Session: [facility] for tasks performed    Extremity/Trunk Assessment Right Lower Extremity Assessment RLE ROM/Strength/Tone: WFL for tasks assessed RLE Sensation: WFL - Light Touch RLE Coordination: WFL - gross motor Left Lower Extremity Assessment LLE ROM/Strength/Tone: WFL for tasks assessed LLE Sensation: WFL - Light Touch LLE Coordination: WFL - gross motor Trunk Assessment Trunk Assessment: Kyphotic   Balance Balance Balance Assessed: No (WNL by functional observation)  End of Session PT - End of Session Equipment Utilized During Treatment: Gait belt Activity Tolerance: Patient tolerated treatment well Patient left: in bed;with call bell/phone within reach;with bed alarm set Nurse Communication: Mobility status  GP Functional Assessment Tool Used: clinical judgement Functional Limitation: Mobility: Walking and moving around Mobility: Walking and Moving Around Current Status (Y7829): At least 1 percent but less than 20 percent impaired, limited or restricted Mobility: Walking and Moving Around Goal Status [phone]): At least 1 percent but less than 20 percent impaired, limited or  restricted Mobility: Walking and Moving Around Discharge Status (  O1308): At least 1 percent but less than 20 percent impaired, limited or restricted   Konrad Penta 11/11/2012, 10:14 AM

## 2012-11-11 NOTE — Progress Notes (Signed)
D/c instructions reviewed with patient and daughter.  Verbalized understanding.  Pt dc'd to home with daughter.  Schonewitz, Candelaria Stagers 11/11/2012

## 2012-11-11 NOTE — Progress Notes (Signed)
*  PRELIMINARY RESULTS* Echocardiogram 2D Echocardiogram has been performed.  Conrad [place] 11/11/2012, 10:25 AM

## 2012-11-11 NOTE — Progress Notes (Signed)
UR Chart Review Completed  

## 2012-11-11 NOTE — Care Management Note (Signed)
    Page 1 of 1   11/11/2012     3:06:43 PM   CARE MANAGEMENT NOTE 11/11/2012  Patient:  [name], [name]   Account Number:  [id number]  Date Initiated:  11/11/2012  Documentation initiated by:  Rosemary Holms  Subjective/Objective Assessment:   Pt admitted from home where she lives with her family. Daughter concerned about her CAP aid and having no continuity. Telephone # given for her to f/u. Plan to DC home.     Action/Plan:   Anticipated DC Date:  11/11/2012   Anticipated DC Plan:  HOME/SELF CARE      DC Planning Services  CM consult      Choice offered to / List presented to:             Status of service:  Completed, signed off Medicare Important Message given?   (If response is "NO", the following Medicare IM given date fields will be blank) Date Medicare IM given:   Date Additional Medicare IM given:    Discharge Disposition:  HOME/SELF CARE  Per UR Regulation:    If discussed at Long Length of Stay Meetings, dates discussed:    Comments:  11/11/12 Rosemary Holms RN BSN CM Elizuis 2/5 mg bid per benefit check will have no copay, no authorization.

## 2012-11-11 NOTE — Discharge Summary (Signed)
Physician Discharge Summary  [name] [name]:[id number] DOB: 1919-03-23 DOA: 11/08/2012  PCP: Milinda Antis, MD  Admit date: 11/08/2012 Discharge date: 11/11/2012  Time spent: 40 minutes  Recommendations for Outpatient Follow-up:  1. Outpatient cardiology appointment has been scheduled. 2. Followup with primary care physician in 2 weeks. 3. Followup TSH in 4-6 weeks  Discharge Diagnoses:  Active Problems:   HYPOTHYROIDISM   HYPERTENSION   CORONARY ARTERY DISEASE   COPD   SEIZURE DISORDER   Debility   Atrial fibrillation   Dyspnea on exertion   Acute CHF   UTI (urinary tract infection)   Discharge Condition: Improved  Diet recommendation: Low salt  Filed Weights   11/08/12 1732 11/10/12 0500 11/11/12 0550  Weight: 91.3 kg (201 lb 4.5 oz) 91.2 kg (201 lb 1 oz) 94 kg (207 lb 3.7 oz)    History of present illness:  [name] is a 79 y.o. female who presents to the emergency room with shortness of breath and worsening lower extremity edema. Patient reports that her symptoms have been somewhat chronic, but appear to have worsened over the past week. She reports experiencing dyspnea on exertion, which resolves with rest. She denies any orthopnea. Denies any cough or fever. She's also been experiencing palpitations was also get worse on exertion. She's had occasional chest pain, nothing recently. She has noted that she is having worsening lower extremity edema. She has been prescribed a diuretic, but admits to not taking it regularly. Over the past few days she has doubled her dose, but then reports that her lower extremity edema has progressively gotten worse. She went to her primary care physician's office and was subsequently sent to the emergency room for evaluation. In the ER she was noted to be in atrial fibrillation with a controlled rate. She was also noted to have significant lower extremity edema which was pitting. She was ambulated and was found to have a heart rate  going into the 120s while becoming extremely short of breath. She is somewhat deconditioned and requires assistance in ambulating with a walker. She's been referred for admission. She's noticed her on any new medications lately. Review of record indicates that her Synthroid dose was recently adjusted.   Hospital Course:  This lady was admitted to the hospital with shortness of breath on exertion, lower extremity edema, and a regular heart rate. She was found to have new-onset atrial fibrillation as well as volume overload from acute on chronic diastolic congestive heart failure. She was admitted to the telemetry unit and started on IV Lasix. She was given beta blockers in the form of Coreg for rate control. Currently her heart rate is controlled but remains initial fibrillation. With an elevated chads score, she was started on anticoagulation with Eliquis. She will followup with cardiology for further adjustment of medications. She's had good urine output and her shortness of breath has resolved. Lower extremity edema has also improved. We will continue her on Demadex as an outpatient. She was advised to be compliant with her medications and take it regularly. She'll follow up with cardiology for further adjustment of medications.  Patient was in the low TSH. Her Synthroid dosing was decreased from 150 mcg to 125 mcg daily. She will need repeat thyroid function tests in 4-6 weeks.  Patient was found to have an Escherichia coli urinary tract infection, present on admission. She was started on Rocephin and this was changed to Augmentin after sensitivities are available. She will complete a total of 7  days, she is afebrile and has a normal WBC count  Patient was seen by physical therapy and no further outpatient physical therapy was recommended since it was felt the patient was at her baseline.  Procedures: 2-D echo:- Left ventricle: The cavity size was moderately reduced. Wall thickness was normal.  Systolic function was normal. The estimated ejection fraction was in the range of 60% to 65%. Cannot exclude hypokinesis of the basalinferolateral myocardium. The study is not technically sufficient to allow evaluation of LV diastolic function. Limited images overall - no marked change compared to previous study 2011. - Aortic valve: Poorly visualized. Mildly to moderately calcified annulus. Probably trileaflet; mildly calcified leaflets. Cusp separation was reduced. There was no clearly defined stenosis based on limited images. Mean gradient: 8mm Hg (S). - Mitral valve: Calcified annulus. Mildly thickened, mildly calcified leaflets . Trivial regurgitation. - Left atrium: The atrium was severely dilated. - Right ventricle: The cavity size was mildly dilated. Systolic function was mildly reduced. - Right atrium: Central venous pressure: 10mm Hg (est). - Tricuspid valve: Mild regurgitation. - Pulmonary arteries: Systolic pressure could not be accurately estimated. - Pericardium, extracardiac: There was no pericardial effusion.     Consultations:  None  Discharge Exam: Filed Vitals:   11/10/12 1822 11/10/12 2100 11/11/12 0550 11/11/12 1305  BP: 107/62 113/74 110/73 97/60  Pulse: 71 80 67 79  Temp:  98 F (36.7 C) 97.7 F (36.5 C) 97.8 F (36.6 C)  TempSrc:  Oral Oral   Resp:  20 20   Height:      Weight:   94 kg (207 lb 3.7 oz)   SpO2:  98% 95% 96%    General: No acute distress, feeling well Cardiovascular: S1, S2, irregular Respiratory: Clear to auscultation bilaterally  Discharge Instructions      Discharge Orders   Future Appointments Provider Department Dept Phone   11/19/2012 1:00 PM Jodelle Gross, NP [facility] [place] [phone]   12/18/2012 1:45 PM Vickki Hearing, MD [facility] [phone]   Future Orders Complete By Expires     (HEART FAILURE PATIENTS) Call MD:  Anytime you have any of the following  symptoms: 1) 3 pound weight gain in 24 hours or 5 pounds in 1 week 2) shortness of breath, with or without a dry hacking cough 3) swelling in the hands, feet or stomach 4) if you have to sleep on extra pillows at night in order to breathe.  As directed     Diet - low sodium heart healthy  As directed     Increase activity slowly  As directed         Medication List    TAKE these medications       acetaminophen 325 MG tablet  Commonly known as:  TYLENOL  Take 2 tablets (650 mg total) by mouth every 4 (four) hours as needed (or Fever >/= 101).     ALPRAZolam 0.25 MG tablet  Commonly known as:  XANAX  Take 1 tablet (0.25 mg total) by mouth at bedtime as needed for sleep.     amoxicillin-clavulanate 500-125 MG per tablet  Commonly known as:  AUGMENTIN  Take 1 tablet (500 mg total) by mouth 3 (three) times daily.     apixaban 2.5 MG Tabs tablet  Commonly known as:  ELIQUIS  Take 1 tablet (2.5 mg total) by mouth 2 (two) times daily.     aspirin EC 81 MG tablet  Take 81 mg by  mouth daily.     carvedilol 3.125 MG tablet  Commonly known as:  COREG  Take 1 tablet (3.125 mg total) by mouth 2 (two) times daily with a meal.     diclofenac sodium 1 % Gel  Commonly known as:  VOLTAREN  Apply 2 g topically every 6 (six) hours as needed (pain).     ezetimibe-simvastatin 10-80 MG per tablet  Commonly known as:  VYTORIN  Take 1 tablet by mouth at bedtime.     folic acid 1 MG tablet  Commonly known as:  FOLVITE  Take 1 tablet (1 mg total) by mouth daily.     HYDROcodone-acetaminophen 5-325 MG per tablet  Commonly known as:  NORCO/VICODIN  Take 1 tablet by mouth every 6 (six) hours as needed for pain.     levETIRAcetam 250 MG tablet  Commonly known as:  KEPPRA  Take 1 tablet (250 mg total) by mouth 2 (two) times daily.     levothyroxine 125 MCG tablet  Commonly known as:  SYNTHROID, LEVOTHROID  Take 1 tablet (125 mcg total) by mouth daily before breakfast.      neomycin-polymyxin-hydrocortisone 3.5-10000-1 otic suspension  Commonly known as:  CORTISPORIN  Place 4 drops in ear(s) 3 (three) times daily.     Olopatadine HCl 0.2 % Soln  Place 1 drop into both eyes daily as needed. 1 drop each eye daily as needed(dry eyes)     potassium chloride 10 MEQ tablet  Commonly known as:  K-DUR  Take 1 tablet (10 mEq total) by mouth daily.     torsemide 20 MG tablet  Commonly known as:  DEMADEX  Take 1 tablet (20 mg total) by mouth daily. For leg swelling, take with potassium       Follow-up Information   Follow up with Joni Reining, NP On 11/19/2012. (1:00pm)    Contact information:   [address] [place] [address] [phone]       Follow up with Milinda Antis, MD. Schedule an appointment as soon as possible for a visit in 2 weeks.   Contact information:   [address] [place] [address] [phone]        The results of significant diagnostics from this hospitalization (including imaging, microbiology, ancillary and laboratory) are listed below for reference.    Significant Diagnostic Studies: Dg Chest 2 View  10/31/2012  *RADIOLOGY REPORT*  Clinical Data: Chest pain.  Shortness of breath.  Bilateral lower extremity edema.  Smoker.  History of coronary artery disease with MI.  CHEST - 2 VIEW  Comparison: One-view chest x-ray 04/23/2012 and two-view chest x- ray 09/21/2011.  Findings: Suboptimal inspiration due to body habitus which accounts for crowded bronchovascular markings at the bases and accentuates the cardiac silhouette.  There is no account, cardiac silhouette moderately enlarged but stable.  Pulmonary vascularity normal without evidence of pulmonary edema.  Consolidation in the left lower lobe posteriorly.  Lungs otherwise clear.  No visible pleural effusions.  Elevation of the left hemidiaphragm as noted previously.  Thoracic aorta atherosclerotic, unchanged.  Hilar and mediastinal contours otherwise  unremarkable. Degenerative changes and DISH involving the thoracic spine.  IMPRESSION: Stable moderate cardiomegaly without pulmonary edema.  Left lower lobe atelectasis and/or pneumonia.   Original Report Authenticated By: Hulan Saas, M.D.    Dg Chest Port 1 View  11/08/2012  *RADIOLOGY REPORT*  Clinical Data: Atrial fibrillation  PORTABLE CHEST - 1 VIEW  Comparison: 10/31/2012  Findings: The cardiac shadow is  mildly enlarged.  The left hemidiaphragm remains mildly elevated.  No focal infiltrate or sizable effusion is seen.  Minimal left basilar atelectasis is noted.  IMPRESSION: No change from prior exam.   Original Report Authenticated By: Alcide Clever, M.D.     Microbiology: Recent Results (from the past 240 hour(s))  URINE CULTURE     Status: None   Collection Time    11/08/12  2:57 PM      Result Value Range Status   Specimen Description URINE, CLEAN CATCH   Final   Special Requests NONE   Final   Culture  Setup Time 11/09/2012 21:48   Final   Colony Count >=100,000 COLONIES/ML   Final   Culture ESCHERICHIA COLI   Final   Report Status 11/11/2012 FINAL   Final   Organism ID, Bacteria ESCHERICHIA COLI   Final     Labs: Basic Metabolic Panel:  Recent Labs Lab 11/08/12 1308 11/09/12 0753 11/10/12 0435 11/11/12 0508  NA 141 137 135 135  K 4.0 4.6 4.5 4.2  CL 102 100 100 98  CO2 28 29 29 29   GLUCOSE 89 89 81 104*  BUN 19 20 27* 31*  CREATININE 1.06 1.05 1.22* 1.22*  CALCIUM 10.0 9.5 9.4 9.6   Liver Function Tests: No results found for this basename: AST, ALT, ALKPHOS, BILITOT, PROT, ALBUMIN,  in the last 168 hours No results found for this basename: LIPASE, AMYLASE,  in the last 168 hours No results found for this basename: AMMONIA,  in the last 168 hours CBC:  Recent Labs Lab 11/08/12 1308 11/09/12 0753 11/11/12 0508  WBC 6.3 6.0 6.0  NEUTROABS 2.5  --   --   HGB 13.5 12.6 12.4  HCT 41.2 39.0 39.0  MCV 88.4 88.8 89.0  PLT 257 250 221   Cardiac  Enzymes:  Recent Labs Lab 11/08/12 1308 11/08/12 1809 11/08/12 2306 11/09/12 0753  TROPONINI <0.30 <0.30 <0.30 <0.30   BNP: BNP (last 3 results)  Recent Labs  10/31/12 1910 11/08/12 1308  PROBNP 3114.0* 2516.0*   CBG: No results found for this basename: GLUCAP,  in the last 168 hours     Signed:  MEMON,JEHANZEB  [facility] 11/11/2012, 6:41 PM

## 2012-11-14 ENCOUNTER — Telehealth: Payer: Self-pay

## 2012-11-14 NOTE — Telephone Encounter (Signed)
States she recently discharged with a new diagnosis of CHF. They were not given much information on this and [facility] was able to provide a scale and some info on fluid restriction but they need more assistance than what she can provide on a once a month visit. Thinks a short term referral to home health would be beneficial

## 2012-11-15 NOTE — Telephone Encounter (Signed)
You can put in a referral to [facility], for CHF , they should do weights on her, blood pressure readings, provide information on salt restriction

## 2012-11-15 NOTE — Telephone Encounter (Signed)
Referral sent 

## 2012-11-19 ENCOUNTER — Ambulatory Visit (INDEPENDENT_AMBULATORY_CARE_PROVIDER_SITE_OTHER): Payer: Medicare PPO | Admitting: Adult Health

## 2012-11-19 ENCOUNTER — Encounter: Payer: Self-pay | Admitting: Adult Health

## 2012-11-19 VITALS — BP 122/88 | HR 67 | Wt 200.0 lb

## 2012-11-19 DIAGNOSIS — I4891 Unspecified atrial fibrillation: Secondary | ICD-10-CM

## 2012-11-19 DIAGNOSIS — F17200 Nicotine dependence, unspecified, uncomplicated: Secondary | ICD-10-CM

## 2012-11-19 DIAGNOSIS — R0989 Other specified symptoms and signs involving the circulatory and respiratory systems: Secondary | ICD-10-CM

## 2012-11-19 MED ORDER — APIXABAN 2.5 MG PO TABS: 2.5000 mg | ORAL_TABLET | Freq: Two times a day (BID) | ORAL | Status: DC

## 2012-11-19 NOTE — Progress Notes (Signed)
HPI: [name] is a 79 year old patient who we are following status post hospitalization in the setting of known coronary artery disease, with history of hypertension COPD and new onset atrial fibrillation. She was admitted to [facility] on 11/08/2012 with diastolic CHF, and new onset atrial fibrillation. Just was found to have UTI appear rate was controlled on carvedilol, and she was also placed on torsemide for ongoing management. Not found to be a Coumadin candidate secondary to age. She is instead on apixaban 2.5 mg BID.    She comes today with complaints of generalized fatigue.Easily tired. She is very sedentary. She has no complaints of chest pain. Medically compliant.  No Known Allergies  Current Outpatient Prescriptions  Medication Sig Dispense Refill  . acetaminophen (TYLENOL) 325 MG tablet Take 2 tablets (650 mg total) by mouth every 4 (four) hours as needed (or Fever >/= 101).      Marland Kitchen ALPRAZolam (XANAX) 0.25 MG tablet Take 1 tablet (0.25 mg total) by mouth at bedtime as needed for sleep.  30 tablet  2  . amoxicillin-clavulanate (AUGMENTIN) 500-125 MG per tablet Take 1 tablet (500 mg total) by mouth 3 (three) times daily.  9 tablet  0  . apixaban (ELIQUIS) 2.5 MG TABS tablet Take 1 tablet (2.5 mg total) by mouth 2 (two) times daily.  60 tablet  1  . aspirin EC 81 MG tablet Take 81 mg by mouth daily.      . carvedilol (COREG) 3.125 MG tablet Take 1 tablet (3.125 mg total) by mouth 2 (two) times daily with a meal.  60 tablet  1  . diclofenac sodium (VOLTAREN) 1 % GEL Apply 2 g topically every 6 (six) hours as needed (pain).  100 g  3  . ezetimibe-simvastatin (VYTORIN) 10-80 MG per tablet Take 1 tablet by mouth at bedtime.        . folic acid (FOLVITE) 1 MG tablet Take 1 tablet (1 mg total) by mouth daily.  30 tablet  4  . HYDROcodone-acetaminophen (NORCO/VICODIN) 5-325 MG per tablet Take 1 tablet by mouth every 6 (six) hours as needed for pain.      Marland Kitchen levETIRAcetam (KEPPRA)  250 MG tablet Take 1 tablet (250 mg total) by mouth 2 (two) times daily.  60 tablet  4  . levothyroxine (SYNTHROID, LEVOTHROID) 125 MCG tablet Take 1 tablet (125 mcg total) by mouth daily before breakfast.      . neomycin-polymyxin-hydrocortisone (CORTISPORIN) 3.5-10000-1 otic suspension Place 4 drops in ear(s) 3 (three) times daily.  10 mL  0  . Olopatadine HCl 0.2 % SOLN Place 1 drop into both eyes daily as needed. 1 drop each eye daily as needed(dry eyes)      . potassium chloride (K-DUR) 10 MEQ tablet Take 1 tablet (10 mEq total) by mouth daily.  30 tablet  4  . torsemide (DEMADEX) 20 MG tablet Take 1 tablet (20 mg total) by mouth daily. For leg swelling, take with potassium  30 tablet  3   No current facility-administered medications for this visit.    Past Medical History  Diagnosis Date  . Myocardial infarction     history of  . Coronary artery disease   . Leg edema     left  . Aneurysm     hx of  . COPD (chronic obstructive pulmonary disease)   . Hypertension   . Pulmonary nodule   . Obesity   . Anxiety   . Depression   . Hemoptysis   .  Pressure ulcer of back     lower  . Tobacco abuse   . DJD (degenerative joint disease)   . Panic disorder   . Urinary incontinence   . Seizure disorder   . Ganglion cyst     left wrist  . Hypothyroidism   . Leg edema   . DDD (degenerative disc disease), lumbosacral   . Seizures     Past Surgical History  Procedure Laterality Date  . Appendectomy    . Cholecystectomy    . Replacement total knee  11/2000    right   . Angioplasty      times 2    WUJ:WJXBJY of systems complete and found to be negative unless listed above  PHYSICAL EXAM BP 122/88  Pulse 67  Wt 200 lb (90.719 kg)  BMI 30.42 kg/m2  SpO2 98% General: Well developed, well nourished, in no acute distress Head: Eyes PERRLA, No xanthomas.   Normal cephalic and atramatic  Lungs: Clear bilaterally to auscultation and percussion. Heart: HRIR S1 S2, without MRG.   Pulses are 2+ & equal.            No carotid bruit. No JVD.  No abdominal bruits. No femoral bruits. Abdomen: Bowel sounds are positive, abdomen soft and non-tender without masses or                  Hernia's noted. Msk:  Back normal, normal gait. Normal strength and tone for age. Extremities: No clubbing, cyanosis or edema.  DP +1 Neuro: Alert and oriented X 3. Psych:  Good affect, responds appropriately  EKG: Atrial fibrillation, with RBBB, Rate of 98 bpm.  ASSESSMENT AND PLAN

## 2012-11-19 NOTE — Assessment & Plan Note (Signed)
His blood pressure is fairly well-controlled. We will not make any changes at this time. We will see her again in 3 months unless she is symptomatic.

## 2012-11-19 NOTE — Assessment & Plan Note (Signed)
Chest appears to be more to deconditioning than CHF or cardiac etiology. She is very sedentary. We will make no changes in her medication regimen at this time.

## 2012-11-19 NOTE — Patient Instructions (Addendum)
Your physician recommends that you schedule a follow-up appointment in: 3 months.  

## 2012-11-19 NOTE — Progress Notes (Deleted)
Name: [name]    DOB: 1919/04/18  Age: 79 y.o.  MR#: [id number]       PCP:  Milinda Antis, MD      Insurance: Payor: [email or web address] MEDICARE  Plan: [employer] MEDICARE CHOICE PPO  Product Type: *No Product type*    CC:    Chief Complaint  Patient presents with  . Coronary Artery Disease  . Hypertension  . Atrial Fibrillation    VS Filed Vitals:   11/19/12 1253  BP: 122/88  Pulse: 67  Weight: 200 lb (90.719 kg)  SpO2: 98%    Weights Current Weight  11/19/12 200 lb (90.719 kg)  11/11/12 207 lb 3.7 oz (94 kg)  11/08/12 204 lb (92.534 kg)    Blood Pressure  BP Readings from Last 3 Encounters:  11/19/12 122/88  11/11/12 97/60  11/08/12 118/74     Admit date:  (Not on file) Last encounter with RMR:  Visit date not found   Allergy Review of patient's allergies indicates no known allergies.  Current Outpatient Prescriptions  Medication Sig Dispense Refill  . acetaminophen (TYLENOL) 325 MG tablet Take 2 tablets (650 mg total) by mouth every 4 (four) hours as needed (or Fever >/= 101).      Marland Kitchen ALPRAZolam (XANAX) 0.25 MG tablet Take 1 tablet (0.25 mg total) by mouth at bedtime as needed for sleep.  30 tablet  2  . amoxicillin-clavulanate (AUGMENTIN) 500-125 MG per tablet Take 1 tablet (500 mg total) by mouth 3 (three) times daily.  9 tablet  0  . apixaban (ELIQUIS) 2.5 MG TABS tablet Take 1 tablet (2.5 mg total) by mouth 2 (two) times daily.  60 tablet  1  . aspirin EC 81 MG tablet Take 81 mg by mouth daily.      . carvedilol (COREG) 3.125 MG tablet Take 1 tablet (3.125 mg total) by mouth 2 (two) times daily with a meal.  60 tablet  1  . diclofenac sodium (VOLTAREN) 1 % GEL Apply 2 g topically every 6 (six) hours as needed (pain).  100 g  3  . ezetimibe-simvastatin (VYTORIN) 10-80 MG per tablet Take 1 tablet by mouth at bedtime.        . folic acid (FOLVITE) 1 MG tablet Take 1 tablet (1 mg total) by mouth daily.  30 tablet  4  . HYDROcodone-acetaminophen (NORCO/VICODIN) 5-325 MG per  tablet Take 1 tablet by mouth every 6 (six) hours as needed for pain.      Marland Kitchen levETIRAcetam (KEPPRA) 250 MG tablet Take 1 tablet (250 mg total) by mouth 2 (two) times daily.  60 tablet  4  . levothyroxine (SYNTHROID, LEVOTHROID) 125 MCG tablet Take 1 tablet (125 mcg total) by mouth daily before breakfast.      . neomycin-polymyxin-hydrocortisone (CORTISPORIN) 3.5-10000-1 otic suspension Place 4 drops in ear(s) 3 (three) times daily.  10 mL  0  . Olopatadine HCl 0.2 % SOLN Place 1 drop into both eyes daily as needed. 1 drop each eye daily as needed(dry eyes)      . potassium chloride (K-DUR) 10 MEQ tablet Take 1 tablet (10 mEq total) by mouth daily.  30 tablet  4  . torsemide (DEMADEX) 20 MG tablet Take 1 tablet (20 mg total) by mouth daily. For leg swelling, take with potassium  30 tablet  3   No current facility-administered medications for this visit.    Discontinued Meds:   There are no discontinued medications.  Patient Active Problem List  Diagnosis  .  HYPOTHYROIDISM  . OBESITY  . ANXIETY  . PANIC DISORDER  . TOBACCO ABUSE  . HYPERTENSION  . CORONARY ARTERY DISEASE  . COPD  . PULMONARY NODULE  . DEGENERATIVE JOINT DISEASE, KNEE  . SEIZURE DISORDER  . URINARY INCONTINENCE  . Chronic abdominal pain  . Generalized weakness  . Peripheral edema  . Closed fracture of left proximal humerus  . Closed fracture of right proximal humerus  . Debility  . Radial nerve dysfunction  . Anemia  . Decubitus ulcer of sacral region, stage 2  . Toe injury  . Atrial fibrillation with RVR  . Atrial fibrillation  . Dyspnea on exertion  . Acute CHF  . UTI (urinary tract infection)    LABS    Component Value Date/Time   NA 135 11/11/2012 0508   NA 135 11/10/2012 0435   NA 137 11/09/2012 0753   K 4.2 11/11/2012 0508   K 4.5 11/10/2012 0435   K 4.6 11/09/2012 0753   CL 98 11/11/2012 0508   CL 100 11/10/2012 0435   CL 100 11/09/2012 0753   CO2 29 11/11/2012 0508   CO2 29 11/10/2012 0435   CO2  29 11/09/2012 0753   GLUCOSE 104* 11/11/2012 0508   GLUCOSE 81 11/10/2012 0435   GLUCOSE 89 11/09/2012 0753   BUN 31* 11/11/2012 0508   BUN 27* 11/10/2012 0435   BUN 20 11/09/2012 0753   CREATININE 1.22* 11/11/2012 0508   CREATININE 1.22* 11/10/2012 0435   CREATININE 1.05 11/09/2012 0753   CREATININE 0.90 10/25/2012 0855   CREATININE 0.75 02/08/2012 1145   CALCIUM 9.6 11/11/2012 0508   CALCIUM 9.4 11/10/2012 0435   CALCIUM 9.5 11/09/2012 0753   GFRNONAA 38* 11/11/2012 0508   GFRNONAA 38* 11/10/2012 0435   GFRNONAA 45* 11/09/2012 0753   GFRAA 44* 11/11/2012 0508   GFRAA 44* 11/10/2012 0435   GFRAA 52* 11/09/2012 0753   CMP     Component Value Date/Time   NA 135 11/11/2012 0508   K 4.2 11/11/2012 0508   CL 98 11/11/2012 0508   CO2 29 11/11/2012 0508   GLUCOSE 104* 11/11/2012 0508   BUN 31* 11/11/2012 0508   CREATININE 1.22* 11/11/2012 0508   CREATININE 0.90 10/25/2012 0855   CALCIUM 9.6 11/11/2012 0508   PROT 6.3 10/25/2012 0855   ALBUMIN 3.9 10/25/2012 0855   AST 16 10/25/2012 0855   ALT 9 10/25/2012 0855   ALKPHOS 81 10/25/2012 0855   BILITOT 0.7 10/25/2012 0855   GFRNONAA 38* 11/11/2012 0508   GFRAA 44* 11/11/2012 0508       Component Value Date/Time   WBC 6.0 11/11/2012 0508   WBC 6.0 11/09/2012 0753   WBC 6.3 11/08/2012 1308   HGB 12.4 11/11/2012 0508   HGB 12.6 11/09/2012 0753   HGB 13.5 11/08/2012 1308   HCT 39.0 11/11/2012 0508   HCT 39.0 11/09/2012 0753   HCT 41.2 11/08/2012 1308   MCV 89.0 11/11/2012 0508   MCV 88.8 11/09/2012 0753   MCV 88.4 11/08/2012 1308    Lipid Panel     Component Value Date/Time   CHOL 150 10/25/2012 0855   TRIG 66 10/25/2012 0855   HDL 60 10/25/2012 0855   CHOLHDL 2.5 10/25/2012 0855   VLDL 13 10/25/2012 0855   LDLCALC 77 10/25/2012 0855    ABG No results found for this basename: phart, pco2, pco2art, po2, po2art, hco3, tco2, acidbasedef, o2sat     Lab Results  Component Value Date   TSH 0.091* 11/08/2012  BNP (last 3 results)  Recent Labs  10/31/12 1910  11/08/12 1308  PROBNP 3114.0* 2516.0*   Cardiac Panel (last 3 results) No results found for this basename: CKTOTAL, CKMB, TROPONINI, RELINDX,  in the last 72 hours  Iron/TIBC/Ferritin    Component Value Date/Time   IRON 43 08/25/2010 1500   TIBC 244* 08/25/2010 1500   FERRITIN 96 08/25/2010 1500     EKG Orders placed in visit on 11/19/12  . EKG 12-LEAD     Prior Assessment and Plan Problem List as of 11/19/2012     ICD-9-CM     Cardiology Problems   HYPERTENSION   Last Assessment & Plan   10/07/2012 Office Visit Written 10/08/2012  3:31 PM by Salley Scarlet, MD     Well-controlled no change the medication    CORONARY ARTERY DISEASE   Last Assessment & Plan   10/07/2012 Office Visit Written 10/08/2012  3:32 PM by Salley Scarlet, MD     She is doing well. We discussed the fact that she is on her cholesterol medication she has no difficulty taking medications and nothing is bothering her at this time so we will go ahead and continue    Atrial fibrillation with RVR   Last Assessment & Plan   11/08/2012 Office Visit Written 11/08/2012 12:51 PM by Salley Scarlet, MD     New onset a fib, HR is up and down, with DOE and age , worsening leg swelling send to ER for admission    Atrial fibrillation   Acute CHF     Other   HYPOTHYROIDISM   Last Assessment & Plan   11/08/2012 Office Visit Written 11/08/2012 12:52 PM by Salley Scarlet, MD     Decrease to in Feb, this can be rechecked during admission    OBESITY   Last Assessment & Plan   02/08/2012 Office Visit Written 02/11/2012  1:50 PM by Salley Scarlet, MD     noted    ANXIETY   Last Assessment & Plan   10/07/2012 Office Visit Written 10/08/2012  3:33 PM by Salley Scarlet, MD     She has Xanax to take however has not even had refilled that she very very rarely uses this medication    PANIC DISORDER   TOBACCO ABUSE   COPD   Last Assessment & Plan   02/08/2012 Office Visit Written 02/11/2012  1:55 PM by Salley Scarlet, MD     No inhalers currently, breathing is okay    PULMONARY NODULE   DEGENERATIVE JOINT DISEASE, KNEE   Last Assessment & Plan   10/07/2012 Office Visit Written 10/08/2012  3:34 PM by Salley Scarlet, MD     Will put her back on full tear in jail. She rarely takes the hydrocodone which was given by orthopedics    SEIZURE DISORDER   Last Assessment & Plan   10/07/2012 Office Visit Written 10/08/2012  3:32 PM by Salley Scarlet, MD     No recent seizure activity she is doing well on Keppra    URINARY INCONTINENCE   Last Assessment & Plan   02/08/2012 Office Visit Written 02/11/2012  1:54 PM by Salley Scarlet, MD     She likley has some bladder prolapse as well, with her urgency, culture to be done before treatment    Chronic abdominal pain   Generalized weakness   Last Assessment & Plan   03/07/2012 Office Visit Written 03/07/2012  1:07 PM  by Salley Scarlet, MD     I think she would benefit from help though she has declined help in the past that her family has set up and paid for out of pocket. She lives along and has a higher fall risk with her deconditioning. She does not appear ill today, labs reviewed. She could use help with preparing meals, bathing and outings.     Peripheral edema   Last Assessment & Plan   11/08/2012 Office Visit Written 11/08/2012 12:52 PM by Salley Scarlet, MD     Fairly normal Echo in 2011, EF 65%, concern for new onset CHF as well    Closed fracture of left proximal humerus   Closed fracture of right proximal humerus   Debility   Radial nerve dysfunction   Anemia   Decubitus ulcer of sacral region, stage 2   Toe injury   Last Assessment & Plan   10/07/2012 Office Visit Written 10/08/2012  3:33 PM by Salley Scarlet, MD     She stubbed her toe this morning the small laceration is already sealed over her whole have her do Epsom salt for the swelling and keep clean    Dyspnea on exertion   UTI (urinary tract infection)       Imaging: Dg  Chest 2 View  10/31/2012  *RADIOLOGY REPORT*  Clinical Data: Chest pain.  Shortness of breath.  Bilateral lower extremity edema.  Smoker.  History of coronary artery disease with MI.  CHEST - 2 VIEW  Comparison: One-view chest x-ray 04/23/2012 and two-view chest x- ray 09/21/2011.  Findings: Suboptimal inspiration due to body habitus which accounts for crowded bronchovascular markings at the bases and accentuates the cardiac silhouette.  There is no account, cardiac silhouette moderately enlarged but stable.  Pulmonary vascularity normal without evidence of pulmonary edema.  Consolidation in the left lower lobe posteriorly.  Lungs otherwise clear.  No visible pleural effusions.  Elevation of the left hemidiaphragm as noted previously.  Thoracic aorta atherosclerotic, unchanged.  Hilar and mediastinal contours otherwise unremarkable. Degenerative changes and DISH involving the thoracic spine.  IMPRESSION: Stable moderate cardiomegaly without pulmonary edema.  Left lower lobe atelectasis and/or pneumonia.   Original Report Authenticated By: Hulan Saas, M.D.    Dg Chest Port 1 View  11/08/2012  *RADIOLOGY REPORT*  Clinical Data: Atrial fibrillation  PORTABLE CHEST - 1 VIEW  Comparison: 10/31/2012  Findings: The cardiac shadow is mildly enlarged.  The left hemidiaphragm remains mildly elevated.  No focal infiltrate or sizable effusion is seen.  Minimal left basilar atelectasis is noted.  IMPRESSION: No change from prior exam.   Original Report Authenticated By: Alcide Clever, M.D.

## 2012-11-19 NOTE — Assessment & Plan Note (Signed)
She is doing okay from our standpoint today. Heart rate is well-controlled. She offers no complaints of dizziness palpitations or bleeding issues on apixaban. We will continue current medication regimen. She is easily fatigued and is very sedentary. I have asked her to get up and walk around her home a few times a day to help to increase her stamina.

## 2012-11-28 ENCOUNTER — Telehealth: Payer: Self-pay | Admitting: Family Medicine

## 2012-11-28 ENCOUNTER — Encounter: Payer: Self-pay | Admitting: Family Medicine

## 2012-11-28 NOTE — Telephone Encounter (Signed)
Patient aware that appointment changed to 4/22 @ 3pm

## 2012-12-02 ENCOUNTER — Ambulatory Visit: Payer: Medicare PPO | Admitting: Family Medicine

## 2012-12-03 ENCOUNTER — Encounter: Payer: Self-pay | Admitting: Family Medicine

## 2012-12-03 ENCOUNTER — Ambulatory Visit (INDEPENDENT_AMBULATORY_CARE_PROVIDER_SITE_OTHER): Payer: Medicare PPO | Admitting: Family Medicine

## 2012-12-03 VITALS — BP 130/84 | HR 100 | Resp 18 | Wt 201.0 lb

## 2012-12-03 DIAGNOSIS — N390 Urinary tract infection, site not specified: Secondary | ICD-10-CM

## 2012-12-03 DIAGNOSIS — I4891 Unspecified atrial fibrillation: Secondary | ICD-10-CM

## 2012-12-03 DIAGNOSIS — R609 Edema, unspecified: Secondary | ICD-10-CM

## 2012-12-03 DIAGNOSIS — J449 Chronic obstructive pulmonary disease, unspecified: Secondary | ICD-10-CM

## 2012-12-03 DIAGNOSIS — E039 Hypothyroidism, unspecified: Secondary | ICD-10-CM

## 2012-12-03 NOTE — Patient Instructions (Addendum)
Take all of your medications Take the water pill with potassium today Take your heart medicine for the evening ( Coreg) F/U 8 weeks

## 2012-12-03 NOTE — Progress Notes (Signed)
  Subjective:    Patient ID: [name], female    DOB: [birth year], 79 y.o.   MRN: [id number]  HPI  Pt here to f/u hospital admission for new onset A fib. Doing well at home, reviewed cardiology note and last set of labs. She has not been taking all of her meds including Coreg, and diuretic because she was afraid of the side effects that the [facility] nurse told her about. No recent chest pain.  Just had 89nd birthday  Review of Systems  GEN- + fatigue, fever, weight loss,weakness, recent illness HEENT- denies eye drainage, change in vision, nasal discharge, CVS- denies chest pain, palpitations RESP- denies SOB, cough, wheeze ABD- denies N/V, change in stools, abd pain GU- denies dysuria, hematuria, dribbling, incontinence MSK- + joint pain, muscle aches, injury Neuro- denies headache, dizziness, syncope, seizure activity      Objective:   Physical Exam GEN- NAD, alert and oriented x3 HEENT- PERRL, EOMI, non injected sclera, pink conjunctiva, MMM, oropharynx clear Neck- Supple, no thryomegaly CVS- irregular rhythm,mildly elevated rate, no murmur RESP-mild basilar crackles, normal WOB EXT- Pedal edema Pulses- Radial, DP- 2+        Assessment & Plan:

## 2012-12-03 NOTE — Assessment & Plan Note (Signed)
Doing well, no SOB out of ordinary, continues to smoke

## 2012-12-03 NOTE — Assessment & Plan Note (Signed)
Edema is improved, but difficult to get pt to follow through with taking her diuretics and other medications on a regular basis, she is worried about side effects which I discussed with her today

## 2012-12-03 NOTE — Assessment & Plan Note (Signed)
Complete antibiotics. 

## 2012-12-03 NOTE — Assessment & Plan Note (Signed)
Recently new diagnosis at last visit prompting hospitilzation, seen by cardiology, rate controlled, discussed importance of taking all of her medications, Eliquis for anti-coagulation

## 2012-12-03 NOTE — Assessment & Plan Note (Signed)
Plan to recheck TFT next visit, recent decrease in her medication

## 2012-12-04 ENCOUNTER — Other Ambulatory Visit: Payer: Self-pay

## 2012-12-04 MED ORDER — TORSEMIDE 20 MG PO TABS: 20.0000 mg | ORAL_TABLET | Freq: Every day | ORAL | Status: DC

## 2012-12-04 MED ORDER — FOLIC ACID 1 MG PO TABS: 1.0000 mg | ORAL_TABLET | Freq: Every day | ORAL | Status: DC

## 2012-12-04 MED ORDER — LEVETIRACETAM 250 MG PO TABS: 250.0000 mg | ORAL_TABLET | Freq: Two times a day (BID) | ORAL | Status: DC

## 2012-12-05 ENCOUNTER — Telehealth: Payer: Self-pay | Admitting: Family Medicine

## 2012-12-05 DIAGNOSIS — I509 Heart failure, unspecified: Secondary | ICD-10-CM

## 2012-12-05 DIAGNOSIS — I4891 Unspecified atrial fibrillation: Secondary | ICD-10-CM

## 2012-12-05 NOTE — Telephone Encounter (Signed)
Verbal given for pt for 11 visits through [facility]

## 2012-12-10 ENCOUNTER — Encounter: Payer: Self-pay | Admitting: *Deleted

## 2012-12-18 ENCOUNTER — Ambulatory Visit: Payer: Medicare PPO | Admitting: Orthopedic Surgery

## 2012-12-26 ENCOUNTER — Ambulatory Visit: Payer: Medicare PPO | Admitting: Orthopedic Surgery

## 2012-12-30 ENCOUNTER — Telehealth: Payer: Self-pay

## 2012-12-30 NOTE — Telephone Encounter (Signed)
Okay to send 

## 2012-12-31 NOTE — Telephone Encounter (Signed)
Order sent along with ov notes.

## 2013-01-10 ENCOUNTER — Telehealth: Payer: Self-pay | Admitting: Family Medicine

## 2013-01-13 NOTE — Telephone Encounter (Signed)
Called and left msg notifying therapist that patient does not qualify for commode through [facility]

## 2013-01-20 ENCOUNTER — Telehealth: Payer: Self-pay | Admitting: Family Medicine

## 2013-01-20 NOTE — Telephone Encounter (Signed)
Tried calling pt to see if swelling in legs are gone.No one anwereed.Marland Kitchen i tried calling Home Health nurse and left message to return my call.

## 2013-01-20 NOTE — Telephone Encounter (Signed)
Home Health Nurse called stating that pts weight was up from Sunday 122 to 125 on Wednes. Dr. Jeanice Lim increased her demidex to BID.

## 2013-01-20 NOTE — Telephone Encounter (Signed)
Spoke with pt, she increased fluid pill to BID, leg swelling has gone down Felt a little "swimmy headed today" BP 126/86 by [facility] She was given 2 new meds, she tried to spell them for me- melatonin and meloxicam??, advised not to take either one , she has appt with ortho tomorrow, she will show the meloxicam to Dr. Romeo Apple to see if he ordered this

## 2013-01-21 ENCOUNTER — Ambulatory Visit (INDEPENDENT_AMBULATORY_CARE_PROVIDER_SITE_OTHER): Payer: Medicare PPO | Admitting: Orthopedic Surgery

## 2013-01-21 VITALS — BP 102/62 | Ht 68.0 in | Wt 208.0 lb

## 2013-01-21 DIAGNOSIS — S42309S Unspecified fracture of shaft of humerus, unspecified arm, sequela: Secondary | ICD-10-CM

## 2013-01-21 DIAGNOSIS — S42202S Unspecified fracture of upper end of left humerus, sequela: Secondary | ICD-10-CM

## 2013-01-21 DIAGNOSIS — S42201S Unspecified fracture of upper end of right humerus, sequela: Secondary | ICD-10-CM

## 2013-01-21 DIAGNOSIS — G5630 Lesion of radial nerve, unspecified upper limb: Secondary | ICD-10-CM

## 2013-01-21 DIAGNOSIS — G5631 Lesion of radial nerve, right upper limb: Secondary | ICD-10-CM

## 2013-01-21 NOTE — Patient Instructions (Signed)
BRACE AT NIGHT

## 2013-01-21 NOTE — Progress Notes (Signed)
Patient ID: [name], female   DOB: [birth year], 79 y.o.   MRN: [id number] Mehlman had bilateral humeral fractures but she also had a seizure and she laid on the floor the right side for several hours developed a radial nerve palsy which was treated with cockup splinting. She was able to regain wrist extension power but she has a contracture of the interphalangeal joints of the right hand and she also complains of bilateral numbness and tingling which appears to be carpal tunnel syndrome  Shows decreased range of motion bilateral shoulders crepitance and pain on the right none on the left decreased range of motion bilaterally  Review of systems weakness decreased range of motion in the right shoulder tingling at night and tingling in the tips of her fingers  Impression bilateral carpal tunnel syndrome recommend bilateral carpal tunnel splints  She unfortunately has intrinsic contractures which will be chronic  Carpal tunnel syndrome can be treated conservatively  Followup as needed

## 2013-01-29 ENCOUNTER — Telehealth: Payer: Self-pay | Admitting: Family Medicine

## 2013-01-29 ENCOUNTER — Ambulatory Visit: Payer: Self-pay | Admitting: Family Medicine

## 2013-01-29 MED ORDER — CARVEDILOL 3.125 MG PO TABS: 3.1250 mg | ORAL_TABLET | Freq: Two times a day (BID) | ORAL | Status: DC

## 2013-01-29 NOTE — Telephone Encounter (Signed)
Rx Refilled  

## 2013-01-30 ENCOUNTER — Ambulatory Visit: Payer: Medicare PPO | Admitting: Family Medicine

## 2013-01-30 ENCOUNTER — Telehealth: Payer: Self-pay | Admitting: Family Medicine

## 2013-01-30 MED ORDER — POTASSIUM CHLORIDE ER 10 MEQ PO TBCR: 10.0000 meq | EXTENDED_RELEASE_TABLET | Freq: Every day | ORAL | Status: DC

## 2013-01-30 NOTE — Telephone Encounter (Signed)
Med refilled.

## 2013-02-12 ENCOUNTER — Encounter: Payer: Self-pay | Admitting: Family Medicine

## 2013-02-12 ENCOUNTER — Ambulatory Visit (INDEPENDENT_AMBULATORY_CARE_PROVIDER_SITE_OTHER): Payer: Medicare PPO | Admitting: Family Medicine

## 2013-02-12 VITALS — BP 100/70 | HR 76 | Temp 97.2°F | Resp 16 | Wt 198.0 lb

## 2013-02-12 DIAGNOSIS — E039 Hypothyroidism, unspecified: Secondary | ICD-10-CM

## 2013-02-12 DIAGNOSIS — N390 Urinary tract infection, site not specified: Secondary | ICD-10-CM

## 2013-02-12 DIAGNOSIS — K5900 Constipation, unspecified: Secondary | ICD-10-CM

## 2013-02-12 DIAGNOSIS — R300 Dysuria: Secondary | ICD-10-CM

## 2013-02-12 DIAGNOSIS — R109 Unspecified abdominal pain: Secondary | ICD-10-CM

## 2013-02-12 DIAGNOSIS — R609 Edema, unspecified: Secondary | ICD-10-CM

## 2013-02-12 DIAGNOSIS — I10 Essential (primary) hypertension: Secondary | ICD-10-CM

## 2013-02-12 DIAGNOSIS — I4891 Unspecified atrial fibrillation: Secondary | ICD-10-CM

## 2013-02-12 DIAGNOSIS — M159 Polyosteoarthritis, unspecified: Secondary | ICD-10-CM

## 2013-02-12 LAB — URINALYSIS, MICROSCOPIC ONLY

## 2013-02-12 LAB — URINALYSIS, ROUTINE W REFLEX MICROSCOPIC
Bilirubin Urine: NEGATIVE
Glucose, UA: NEGATIVE mg/dL
Protein, ur: NEGATIVE mg/dL
Urobilinogen, UA: 0.2 mg/dL (ref 0.0–1.0)

## 2013-02-12 MED ORDER — POLYETHYLENE GLYCOL 3350 17 GM/SCOOP PO POWD: 17.0000 g | Freq: Every day | ORAL | Status: DC

## 2013-02-12 MED ORDER — CIPROFLOXACIN HCL 500 MG PO TABS: 500.0000 mg | ORAL_TABLET | Freq: Two times a day (BID) | ORAL | Status: DC

## 2013-02-12 NOTE — Patient Instructions (Addendum)
Please get miralax to move her bowels- prescription sent Continue pain medication for arthritis in hip- hydrocodone We will call with lab results Take antibiotic for urine infection F/U 3 months

## 2013-02-12 NOTE — Assessment & Plan Note (Signed)
Generalized osteoarthritis also noted in her left hip. We will continue the hydrocodone as needed for pain

## 2013-02-12 NOTE — Assessment & Plan Note (Signed)
Rate controlled, on eliquis and coreg

## 2013-02-12 NOTE — Assessment & Plan Note (Signed)
Her edema looks good today, she is taking diuretic as prescribed Check electrolytes

## 2013-02-12 NOTE — Assessment & Plan Note (Signed)
Recheck TFT had medication adjustment 12 weeks ago

## 2013-02-12 NOTE — Progress Notes (Signed)
  Subjective:    Patient ID: [name], female    DOB: 04-21-1919, 79 y.o.   MRN: [id number]  HPI  Pt here with lower abdominal pain for the past week, non radiating, no N/V Fever.. Has dribbling when she urinates, denies burning sensation. Constipation worse, can go days without BM. She also gets pain from hip down into leg, has pain when laying on that side. She is afraid she has cancer.  Taking medications as prescribed including diuretic daily.  Denies chest pain  No recent falls  Hypothyroidism- due for recheck on TFT   Review of Systems   GEN- denies fatigue, fever, weight loss,weakness, recent illness HEENT- denies eye drainage, change in vision, nasal discharge, CVS- denies chest pain, palpitations RESP- denies SOB, cough, wheeze ABD- denies N/V, change in stools, abd pain GU- denies dysuria, hematuria, dribbling, incontinence MSK- denies joint pain, muscle aches, injury Neuro- denies headache, dizziness, syncope, seizure activity      Objective:   Physical Exam GEN- NAD, alert and oriented x3 HEENT- PERRL, EOMI, non injected sclera, pink conjunctiva, MMM, oropharynx clear Neck- Supple, no thryomegaly CVS- irregular rhythm, regular rate, no murmur ABD- NABS, soft, TTP suprapubic region, no rebound, no guarding, no CVA tenderness GU-I  & O cath done RESP-CTAB MSK- no pain with IR/ER left hip, spine NT, neg SLR EXT- Pedal edema Pulses- Radial, DP- 2+       Assessment & Plan:

## 2013-02-12 NOTE — Assessment & Plan Note (Signed)
PEG  for bloating and constipation

## 2013-02-12 NOTE — Assessment & Plan Note (Signed)
Well controlled 

## 2013-02-12 NOTE — Assessment & Plan Note (Signed)
Treat for UTI, send for culture Start Cipro BID for 1 week

## 2013-02-13 LAB — CBC WITH DIFFERENTIAL/PLATELET
Eosinophils Absolute: 0.1 10*3/uL (ref 0.0–0.7)
Eosinophils Relative: 2 % (ref 0–5)
HCT: 40.7 % (ref 36.0–46.0)
Hemoglobin: 13.2 g/dL (ref 12.0–15.0)
Lymphocytes Relative: 46 % (ref 12–46)
Lymphs Abs: 2.9 10*3/uL (ref 0.7–4.0)
MCH: 27.6 pg (ref 26.0–34.0)
MCV: 85 fL (ref 78.0–100.0)
Monocytes Absolute: 1.1 10*3/uL — ABNORMAL HIGH (ref 0.1–1.0)
Monocytes Relative: 18 % — ABNORMAL HIGH (ref 3–12)
Platelets: 241 10*3/uL (ref 150–400)
RBC: 4.79 MIL/uL (ref 3.87–5.11)

## 2013-02-13 LAB — COMPREHENSIVE METABOLIC PANEL
CO2: 30 mEq/L (ref 19–32)
Calcium: 10 mg/dL (ref 8.4–10.5)
Chloride: 104 mEq/L (ref 96–112)
Creat: 1.33 mg/dL — ABNORMAL HIGH (ref 0.50–1.10)
Glucose, Bld: 82 mg/dL (ref 70–99)
Total Bilirubin: 0.9 mg/dL (ref 0.3–1.2)

## 2013-02-13 LAB — TSH: TSH: 0.083 u[IU]/mL — ABNORMAL LOW (ref 0.350–4.500)

## 2013-02-13 LAB — T4, FREE: Free T4: 1.79 ng/dL (ref 0.80–1.80)

## 2013-02-13 MED ORDER — LEVOTHYROXINE SODIUM 100 MCG PO TABS: 100.0000 ug | ORAL_TABLET | Freq: Every day | ORAL | Status: DC

## 2013-02-13 NOTE — Addendum Note (Signed)
Addended by: Milinda Antis F on: 02/13/2013 09:49 PM   Modules accepted: Orders

## 2013-02-14 LAB — URINE CULTURE
Colony Count: NO GROWTH
Organism ID, Bacteria: NO GROWTH

## 2013-03-12 ENCOUNTER — Ambulatory Visit (INDEPENDENT_AMBULATORY_CARE_PROVIDER_SITE_OTHER): Payer: Medicare PPO | Admitting: Family Medicine

## 2013-03-12 ENCOUNTER — Encounter: Payer: Self-pay | Admitting: Family Medicine

## 2013-03-12 VITALS — BP 120/80 | HR 68 | Temp 97.6°F | Resp 16 | Wt 202.0 lb

## 2013-03-12 DIAGNOSIS — M169 Osteoarthritis of hip, unspecified: Secondary | ICD-10-CM | POA: Insufficient documentation

## 2013-03-12 DIAGNOSIS — R1030 Lower abdominal pain, unspecified: Secondary | ICD-10-CM | POA: Insufficient documentation

## 2013-03-12 DIAGNOSIS — M1610 Unilateral primary osteoarthritis, unspecified hip: Secondary | ICD-10-CM

## 2013-03-12 DIAGNOSIS — R109 Unspecified abdominal pain: Secondary | ICD-10-CM

## 2013-03-12 NOTE — Assessment & Plan Note (Signed)
Known OA of HIP. She likely has some DDD in lumbar spine as well. Hip and groin pain worsening. I doubt she is a surgical candidate. She may benefit from steroids injection but will defer to ortho Given hydrocodone refill today Has walker and motorized chair for mobility WIll try to set of PCS services, she has medicaid and medicare

## 2013-03-12 NOTE — Assessment & Plan Note (Signed)
I think this is referred pain from her hip, refer to ortho Continue hydrocodone

## 2013-03-12 NOTE — Progress Notes (Signed)
  Subjective:    Patient ID: [name], female    DOB: 26-Sep-1918, 79 y.o.   MRN: [id number]  HPI  Pt here with left groin pain,wrose with walking, pain when she lies on left hip. Feels like knee is going to give out on her. She feels a bone in her left goin  That is painful. Denies any recent falls. Denies back pain. Has taken 1 pain pill which helped.  Here today with youngest daughter from [place] Request PCS services   Review of Systems  GEN- denies fatigue, fever, weight loss,weakness, recent illness HEENT- denies eye drainage, change in vision, nasal discharge, CVS- denies chest pain, palpitations RESP- denies SOB, cough, wheeze ABD- denies N/V, change in stools, abd pain GU- denies dysuria, hematuria, dribbling, incontinence MSK- +joint pain, muscle aches, injury Neuro- denies headache, dizziness, syncope, seizure activity      Objective:   Physical Exam GEN- NAD, alert and oriented x3 CVS- irregular rhythm, regular rate, no murmur ABD- NABS, soft, TTP suprapubic region, no rebound, no guarding, no CVA tenderness RESP-CTAB MSK- + pain with IR/ER left hip no pain with IR/ER right hip, no effuision bialt knee, fair ROM, back HIP and knees, spine NT, neg SLR EXT- Pedal edema Pulses- Radial, DP- 2+       Assessment & Plan:

## 2013-03-12 NOTE — Patient Instructions (Addendum)
Take the hydrocodone pill twice a day, do not take while driving Referral back to Dr. Romeo Apple for your hip Use your walker I will work on setting up services  F/U 3 months

## 2013-03-27 ENCOUNTER — Telehealth: Payer: Self-pay | Admitting: Family Medicine

## 2013-03-27 NOTE — Telephone Encounter (Signed)
[name] from [facility] called to say:  Pt is Having trouble living w/o assitance She is falling a lot She has agreed to home health  If you agree with home health for her we need to call the number below to get it authorized through her insurance. Berkley Harvey #[phone]  In [facility] [phone] [facility] [phone]

## 2013-03-28 ENCOUNTER — Other Ambulatory Visit: Payer: Self-pay | Admitting: Family Medicine

## 2013-03-28 NOTE — Telephone Encounter (Signed)
Please call and set up the home health through her insurance, we also sent PCS service papers for her as well

## 2013-03-28 NOTE — Telephone Encounter (Signed)
Called [employer] is regards to setting up authorization for Home Health the order is pending. Reference number is [id number]. Some one froom [employer] should be contacting with teh next 48 hrs.

## 2013-04-02 ENCOUNTER — Ambulatory Visit (INDEPENDENT_AMBULATORY_CARE_PROVIDER_SITE_OTHER): Payer: Medicare PPO | Admitting: Family Medicine

## 2013-04-02 ENCOUNTER — Encounter: Payer: Self-pay | Admitting: Family Medicine

## 2013-04-02 VITALS — BP 110/60 | HR 64 | Temp 97.4°F | Ht 63.75 in | Wt 202.0 lb

## 2013-04-02 DIAGNOSIS — R109 Unspecified abdominal pain: Secondary | ICD-10-CM

## 2013-04-02 DIAGNOSIS — B372 Candidiasis of skin and nail: Secondary | ICD-10-CM

## 2013-04-02 DIAGNOSIS — K5900 Constipation, unspecified: Secondary | ICD-10-CM

## 2013-04-02 DIAGNOSIS — G8929 Other chronic pain: Secondary | ICD-10-CM

## 2013-04-02 DIAGNOSIS — R609 Edema, unspecified: Secondary | ICD-10-CM

## 2013-04-02 LAB — URINALYSIS, ROUTINE W REFLEX MICROSCOPIC
Bilirubin Urine: NEGATIVE
Glucose, UA: NEGATIVE mg/dL
Ketones, ur: NEGATIVE mg/dL
Nitrite: NEGATIVE
Specific Gravity, Urine: 1.02 (ref 1.005–1.030)
pH: 5.5 (ref 5.0–8.0)

## 2013-04-02 LAB — URINALYSIS, MICROSCOPIC ONLY
Casts: NONE SEEN
Crystals: NONE SEEN

## 2013-04-02 MED ORDER — NYSTATIN 100000 UNIT/GM EX POWD: CUTANEOUS | Status: DC

## 2013-04-02 NOTE — Patient Instructions (Signed)
Take the water pill in the morning and at 4pm through Saturday Elevate your legs Use the powder on your skin Get the xray done  We will call with blood work  F/U as previous

## 2013-04-03 DIAGNOSIS — B372 Candidiasis of skin and nail: Secondary | ICD-10-CM | POA: Insufficient documentation

## 2013-04-03 LAB — CBC WITH DIFFERENTIAL/PLATELET
Basophils Absolute: 0 10*3/uL (ref 0.0–0.1)
Eosinophils Absolute: 0.2 10*3/uL (ref 0.0–0.7)
Eosinophils Relative: 3 % (ref 0–5)
Hemoglobin: 12.8 g/dL (ref 12.0–15.0)
Lymphocytes Relative: 43 % (ref 12–46)
Lymphs Abs: 2.4 10*3/uL (ref 0.7–4.0)
MCH: 27.9 pg (ref 26.0–34.0)
Neutrophils Relative %: 35 % — ABNORMAL LOW (ref 43–77)
Platelets: 269 10*3/uL (ref 150–400)
RDW: 15 % (ref 11.5–15.5)

## 2013-04-03 LAB — COMPREHENSIVE METABOLIC PANEL
ALT: <8 U/L (ref 0–35)
AST: 16 U/L (ref 0–37)
CO2: 28 mEq/L (ref 19–32)
Creat: 0.88 mg/dL (ref 0.50–1.10)
Sodium: 138 mEq/L (ref 135–145)
Total Bilirubin: 0.8 mg/dL (ref 0.3–1.2)
Total Protein: 6.9 g/dL (ref 6.0–8.3)

## 2013-04-03 LAB — LIPASE: Lipase: <10 U/L (ref 0–75)

## 2013-04-03 LAB — URINE CULTURE: Colony Count: NO GROWTH

## 2013-04-03 NOTE — Assessment & Plan Note (Signed)
Ongoing problem due to non compliance with diuretic Will treat BID for next 3 days, she has potassium to take as well

## 2013-04-03 NOTE — Assessment & Plan Note (Signed)
Apply nystatin powder 

## 2013-04-03 NOTE — Progress Notes (Signed)
  Subjective:    Patient ID: [name], female    DOB: Jan 21, 1919, 79 y.o.   MRN: [id number]  HPI  Pt here with daughterMelford Aase today. Past few weeks complains of raw feeling and burning beneath her stomach but thinks it is inside as well. No change with food, no N/V. Daughter evaluated her skin and noticed rash , no they put baked flour on the skin and this helped.  The pain is now improved but she still has burning sensation. She continues to have difficulty with her bowels, and has trouble moving them.  Leg swelling worse past few days, did not take water pill a few days this week. No SOB, no chest pain.  HIP continues to bother her but does not want to see ortho at this time.   Review of Systems  GEN- denies fatigue, fever, weight loss,weakness, recent illness HEENT- denies eye drainage, change in vision, nasal discharge, CVS- denies chest pain, palpitations RESP- denies SOB, cough, wheeze ABD- denies N/V, change in stools,+ abd pain GU- denies dysuria, hematuria, dribbling, incontinence MSK- + joint pain, muscle aches, injury Neuro- denies headache, dizziness, syncope, seizure activity      Objective:   Physical Exam GEN- NAD, alert and oriented x3 HEENT- PERRL EOMI, non icteric, MMM CVS- irregular rhythm, regular rate, no murmur RESP- CTAB ABD- NABS, soft, Mild TTP suprapubic region, no rebound, no guarding, no CVA tenderness EXT- 1+ pitting edema to shins Pulses- Radial, DP difficult to palpate due to swelling Skin- mild erythema and moisture beneath pannus      Assessment & Plan:

## 2013-04-03 NOTE — Assessment & Plan Note (Signed)
Take miralax as directed Obtain plain films of abdomen

## 2013-04-03 NOTE — Assessment & Plan Note (Signed)
Complains of abd pain most visits, her labs repeated today Urine culture to be done Plain films Her exam is fairly benign, I prefer not to scan her unless nothing else resolves symptoms

## 2013-04-07 ENCOUNTER — Telehealth: Payer: Self-pay | Admitting: Family Medicine

## 2013-04-07 DIAGNOSIS — R296 Repeated falls: Secondary | ICD-10-CM

## 2013-04-07 NOTE — Telephone Encounter (Signed)
[employer] has called back with Authorization for Home Health for this patient.  States Auth for 60 days effective 03/28/13 thru 05/26/13.  Teresa Pelton believe services have already started.  If not they can call [phone] and Francine Graven can adjust dates for services.  Home health Referral placed per Dr Jeanice Lim.

## 2013-04-08 ENCOUNTER — Ambulatory Visit (HOSPITAL_COMMUNITY)
Admission: RE | Admit: 2013-04-08 | Discharge: 2013-04-08 | Disposition: A | Discharge status: Home / Self Care | Payer: Medicare PPO | Source: Ambulatory Visit | Attending: Family Medicine | Admitting: Family Medicine

## 2013-04-08 DIAGNOSIS — K6389 Other specified diseases of intestine: Secondary | ICD-10-CM | POA: Insufficient documentation

## 2013-04-08 DIAGNOSIS — R109 Unspecified abdominal pain: Secondary | ICD-10-CM

## 2013-04-09 ENCOUNTER — Other Ambulatory Visit: Payer: Self-pay | Admitting: Family Medicine

## 2013-04-09 DIAGNOSIS — I2510 Atherosclerotic heart disease of native coronary artery without angina pectoris: Secondary | ICD-10-CM

## 2013-04-09 DIAGNOSIS — I10 Essential (primary) hypertension: Secondary | ICD-10-CM

## 2013-04-09 DIAGNOSIS — M25559 Pain in unspecified hip: Secondary | ICD-10-CM

## 2013-04-09 DIAGNOSIS — R109 Unspecified abdominal pain: Secondary | ICD-10-CM

## 2013-04-09 DIAGNOSIS — IMO0001 Reserved for inherently not codable concepts without codable children: Secondary | ICD-10-CM

## 2013-04-09 DIAGNOSIS — K567 Ileus, unspecified: Secondary | ICD-10-CM

## 2013-04-10 ENCOUNTER — Telehealth: Payer: Self-pay | Admitting: Family Medicine

## 2013-04-10 ENCOUNTER — Other Ambulatory Visit (HOSPITAL_COMMUNITY): Payer: Medicare PPO

## 2013-04-10 ENCOUNTER — Ambulatory Visit (HOSPITAL_COMMUNITY): Payer: Medicare PPO

## 2013-04-10 NOTE — Telephone Encounter (Signed)
[name] called wanting to know what the exact dose of Levothyroxine pt is on, I told her it was as of 08/15/4/14, we both were confirming to make sure this is correct, on her medication list on snapshot is showed she is taking as of 02/15/13, and then starting in Aug. Are we correct that she is suppose to take just of Levothyroxine?

## 2013-04-10 NOTE — Telephone Encounter (Signed)
She is suppose to be on daily, please call and and get rid of the 150 that you refilled in August

## 2013-04-10 NOTE — Telephone Encounter (Signed)
Called [facility] and left message via voicemail suppose to be instead of , called and corrected the error spoke to pharmacist personally [facility] drug.

## 2013-04-11 ENCOUNTER — Encounter (HOSPITAL_COMMUNITY): Payer: Self-pay

## 2013-04-11 ENCOUNTER — Other Ambulatory Visit: Payer: Self-pay | Admitting: Family Medicine

## 2013-04-11 ENCOUNTER — Ambulatory Visit (HOSPITAL_COMMUNITY)
Admission: RE | Admit: 2013-04-11 | Discharge: 2013-04-11 | Disposition: A | Discharge status: Home / Self Care | Payer: Medicare PPO | Source: Ambulatory Visit | Attending: Family Medicine | Admitting: Family Medicine

## 2013-04-11 DIAGNOSIS — R109 Unspecified abdominal pain: Secondary | ICD-10-CM

## 2013-04-11 DIAGNOSIS — K567 Ileus, unspecified: Secondary | ICD-10-CM

## 2013-04-11 DIAGNOSIS — K6389 Other specified diseases of intestine: Secondary | ICD-10-CM | POA: Insufficient documentation

## 2013-04-25 ENCOUNTER — Telehealth: Payer: Self-pay | Admitting: Family Medicine

## 2013-04-25 MED ORDER — HYDROCODONE-ACETAMINOPHEN 5-325 MG PO TABS: 1.0000 | ORAL_TABLET | Freq: Four times a day (QID) | ORAL | Status: DC | PRN

## 2013-04-25 NOTE — Telephone Encounter (Signed)
Pt daughter here, pharmacy never received meds- will send in

## 2013-04-25 NOTE — Telephone Encounter (Signed)
Meds refilled.

## 2013-04-30 ENCOUNTER — Ambulatory Visit: Payer: Medicare PPO | Admitting: Orthopedic Surgery

## 2013-05-01 ENCOUNTER — Other Ambulatory Visit: Payer: Self-pay | Admitting: Family Medicine

## 2013-05-07 ENCOUNTER — Ambulatory Visit (INDEPENDENT_AMBULATORY_CARE_PROVIDER_SITE_OTHER): Payer: Medicare PPO

## 2013-05-07 ENCOUNTER — Encounter: Payer: Self-pay | Admitting: Orthopedic Surgery

## 2013-05-07 ENCOUNTER — Ambulatory Visit (INDEPENDENT_AMBULATORY_CARE_PROVIDER_SITE_OTHER): Payer: Medicare PPO | Admitting: Orthopedic Surgery

## 2013-05-07 VITALS — BP 114/76 | Ht 68.0 in | Wt 202.0 lb

## 2013-05-07 DIAGNOSIS — M25552 Pain in left hip: Secondary | ICD-10-CM

## 2013-05-07 DIAGNOSIS — M25559 Pain in unspecified hip: Secondary | ICD-10-CM

## 2013-05-07 DIAGNOSIS — M169 Osteoarthritis of hip, unspecified: Secondary | ICD-10-CM

## 2013-05-07 DIAGNOSIS — M1710 Unilateral primary osteoarthritis, unspecified knee: Secondary | ICD-10-CM

## 2013-05-07 NOTE — Progress Notes (Signed)
Patient ID: [name], female   DOB: 20-Feb-1919, 79 y.o.   MRN: [id number] Chief Complaint  Patient presents with  . Leg Pain    Left leg and hip pain    HISTORY: This is a 79 year old female comes in complaining of several month history of left groin pain dull throbbing aching pain which is worse when she walks although she says she is not hurting today. Her gait has deteriorated she is using a walker. She seems to better with less activity. Denies bruising numbness tingling locking swelling. Pain is moderate.  Review of systems fatigue, blurred vision, shortness of breath, constipation, depression. Joint pain and swelling.  Past Medical History  Diagnosis Date  . Myocardial infarction     history of  . Coronary artery disease   . Leg edema     left  . Aneurysm     hx of  . COPD (chronic obstructive pulmonary disease)   . Hypertension   . Pulmonary nodule   . Obesity   . Anxiety   . Depression   . Hemoptysis   . Pressure ulcer of back     lower  . Tobacco abuse   . DJD (degenerative joint disease)   . Panic disorder   . Urinary incontinence   . Seizure disorder   . Ganglion cyst     left wrist  . Hypothyroidism   . Leg edema   . DDD (degenerative disc disease), lumbosacral   . Seizures    Past Surgical History  Procedure Laterality Date  . Appendectomy    . Cholecystectomy    . Replacement total knee  11/2000    right   . Angioplasty      times 2     Vitals: BP 114/76  Ht 5\' 8"  (1.727 m)  Wt 202 lb (91.627 kg)  BMI 30.72 kg/m2  General normal she is slightly overweight deteriorating and overall health African American female Oriented x 3  Mood normal   Both legs are equal in length she has bilateral edema in both ankles  She has no palpable tenderness along the hip or greater trochanter. She has normal flexion of the hip without pain 20 of internal rotation 20 of external rotation 15 of adduction and 15 of abduction.  Manual muscle testing was  normal skin was warm dry and intact no instability was detected distal pulses were normal and showed normal sensation in the left foot   The x-ray today shows osteoarthritis left hip she's not a surgical candidate based on age and overall functional level  Recommend intramuscular injection given by the nurse  IM injection left hip For left hip pain Verbal consent followed by timeout to confirm procedure site Under sterile conditions intra- muscular shot was given with 1% became 3 cc Depo-Medrol 40 mg.

## 2013-05-30 ENCOUNTER — Other Ambulatory Visit: Payer: Self-pay | Admitting: Family Medicine

## 2013-05-30 NOTE — Telephone Encounter (Signed)
appt end of this month  Refill until then approved

## 2013-06-09 ENCOUNTER — Telehealth: Payer: Self-pay | Admitting: Family Medicine

## 2013-06-09 MED ORDER — EZETIMIBE-SIMVASTATIN 10-80 MG PO TABS: 1.0000 | ORAL_TABLET | Freq: Every day | ORAL | Status: DC

## 2013-06-09 NOTE — Telephone Encounter (Signed)
Vytorin 10-80 mg

## 2013-06-13 ENCOUNTER — Ambulatory Visit (INDEPENDENT_AMBULATORY_CARE_PROVIDER_SITE_OTHER): Payer: Medicare PPO | Admitting: Family Medicine

## 2013-06-13 VITALS — BP 110/80 | HR 66 | Temp 98.2°F | Resp 16 | Wt 200.0 lb

## 2013-06-13 DIAGNOSIS — I10 Essential (primary) hypertension: Secondary | ICD-10-CM

## 2013-06-13 DIAGNOSIS — I2510 Atherosclerotic heart disease of native coronary artery without angina pectoris: Secondary | ICD-10-CM

## 2013-06-13 DIAGNOSIS — E039 Hypothyroidism, unspecified: Secondary | ICD-10-CM

## 2013-06-13 DIAGNOSIS — R609 Edema, unspecified: Secondary | ICD-10-CM

## 2013-06-13 DIAGNOSIS — R600 Localized edema: Secondary | ICD-10-CM

## 2013-06-13 DIAGNOSIS — K5900 Constipation, unspecified: Secondary | ICD-10-CM

## 2013-06-13 DIAGNOSIS — I4891 Unspecified atrial fibrillation: Secondary | ICD-10-CM

## 2013-06-13 MED ORDER — SIMVASTATIN 40 MG PO TABS: 40.0000 mg | ORAL_TABLET | Freq: Every day | ORAL | Status: DC

## 2013-06-13 MED ORDER — FOLIC ACID 1 MG PO TABS: ORAL_TABLET | ORAL | Status: DC

## 2013-06-13 MED ORDER — HYDROCODONE-ACETAMINOPHEN 5-325 MG PO TABS: 1.0000 | ORAL_TABLET | Freq: Four times a day (QID) | ORAL | Status: DC | PRN

## 2013-06-13 MED ORDER — ALPRAZOLAM 0.25 MG PO TABS: 0.2500 mg | ORAL_TABLET | Freq: Every evening | ORAL | Status: DC | PRN

## 2013-06-13 MED ORDER — POTASSIUM CHLORIDE CRYS ER 10 MEQ PO TBCR: EXTENDED_RELEASE_TABLET | ORAL | Status: DC

## 2013-06-13 NOTE — Patient Instructions (Addendum)
Demadex- fluid take 1 tablet twice a day-Morning, take second dose 3pm ( take a potassium) Until Monday  Complete Vytorin then get new cholesterol medication Pain meds refilled- take with stool softener Take a cap Full of the Miralax every day  Flu shot given F/U 4 months

## 2013-06-14 LAB — COMPREHENSIVE METABOLIC PANEL
ALT: 8 U/L (ref 0–35)
AST: 15 U/L (ref 0–37)
Albumin: 4.4 g/dL (ref 3.5–5.2)
Alkaline Phosphatase: 103 U/L (ref 39–117)
Calcium: 10.4 mg/dL (ref 8.4–10.5)
Chloride: 101 mEq/L (ref 96–112)
Potassium: 4.6 mEq/L (ref 3.5–5.3)
Sodium: 138 mEq/L (ref 135–145)

## 2013-06-14 LAB — TSH: TSH: 1.595 u[IU]/mL (ref 0.350–4.500)

## 2013-06-15 NOTE — Assessment & Plan Note (Signed)
Compliance is a major issue Increase demadex to BID dosing with potassium next 3 days

## 2013-06-15 NOTE — Assessment & Plan Note (Signed)
PAF, rate controlled on anti-coagulation

## 2013-06-15 NOTE — Assessment & Plan Note (Signed)
Check TFT again

## 2013-06-15 NOTE — Progress Notes (Signed)
  Subjective:    Patient ID: [name], female    DOB: [birth year], 79 y.o.   MRN: [id number]  HPI  Pt here to f/u medications. Doing fairly well. Here with daughter. Leg swelling- does not take meds some days, increased swelling this week CAD- restarted on vytorin [facility] nurse noticed did not have at home, states she did not have for past 3-4 months  Constipation- still an issue will not take miralax daily, but using some stool softeners OA- had hip injection by ortho but pain persist. Pain meds do help.  Did not bring all meds with her today Still declines ALF or moving in with her children   Review of Systems - per above  GEN- denies fatigue, fever, weight loss,weakness, recent illness HEENT- denies eye drainage, change in vision, nasal discharge, CVS- denies chest pain, palpitations RESP- denies SOB, cough, wheeze ABD- denies N/V, +change in stools, abd pain GU- denies dysuria, hematuria, dribbling, incontinence MSK- + joint pain, muscle aches, injury Neuro- denies headache, dizziness, syncope, seizure activity       Objective:   Physical Exam GEN- NAD, alert and oriented x3, in wheelchair HEENT- PERRL EOMI, non icteric, MMM, oropharynx clear CVS- irregular rhythm, regular rate, no murmur RESP- CTAB EXT- 1+ pitting edema to shins Pulses- Radial, DP difficult to palpate due to swelling        Assessment & Plan:

## 2013-06-15 NOTE — Assessment & Plan Note (Signed)
Discussed proper use of meds aGAIN Miralax Stool softener with pain meds

## 2013-06-15 NOTE — Assessment & Plan Note (Signed)
Will decrease statin drug to just zocor 40mg , does not need Vytorin, LDL without meds a few months still looks good

## 2013-07-01 ENCOUNTER — Ambulatory Visit: Payer: Medicare PPO | Admitting: Family Medicine

## 2013-07-11 ENCOUNTER — Encounter: Payer: Self-pay | Admitting: Family Medicine

## 2013-07-11 ENCOUNTER — Ambulatory Visit (INDEPENDENT_AMBULATORY_CARE_PROVIDER_SITE_OTHER): Payer: Medicare PPO | Admitting: Family Medicine

## 2013-07-11 VITALS — BP 120/80 | HR 68 | Temp 97.7°F | Resp 18 | Ht 68.5 in | Wt 204.0 lb

## 2013-07-11 DIAGNOSIS — R609 Edema, unspecified: Secondary | ICD-10-CM

## 2013-07-11 DIAGNOSIS — M542 Cervicalgia: Secondary | ICD-10-CM | POA: Insufficient documentation

## 2013-07-11 MED ORDER — HYDROCODONE-ACETAMINOPHEN 5-325 MG PO TABS: 1.0000 | ORAL_TABLET | Freq: Four times a day (QID) | ORAL | Status: DC | PRN

## 2013-07-11 NOTE — Progress Notes (Signed)
   Subjective:    Patient ID: [name], female    DOB: Dec 17, 1918, 79 y.o.   MRN: [id number]  HPI  Patient here with worsening leg swelling. She's here with her daughter states that her legs have been too big they have no weeping. She also has pain when she walks on them. She did try increasing her water pill couple of days but for the most part has been taking one a day. She has not taken anything today. She denies any chest pain or shortness of breath. She did have a fall a couple weeks ago which resulted in some bruising but she has not had any pain since then. Her other complaint today is right sided neck pain on and off. States occasionally she gets a tingling sensation that shoots from her neck to her shoulder.  Review of Systems GEN- denies fatigue, fever, weight loss,weakness, recent illness HEENT- denies eye drainage, change in vision, nasal discharge, CVS- denies chest pain, palpitations RESP- denies SOB, cough, wheeze ABD- denies N/V, change in stools, abd pain GU- denies dysuria, hematuria, dribbling, incontinence MSK- +joint pain, muscle aches, injury Neuro- denies headache, dizziness, syncope, seizure activity      Objective:   Physical Exam  GEN- NAD, alert and oriented x3, in wheelchair HEENT- PERRL EOMI, non icteric, MMM, oropharynx clear Neck- Spine NT, neg Spurlings, Decreased ROM CVS- irregular rhythm, regular rate, no murmur RESP- CTAB EXT- 2+ pitting edema to shins Pulses- Radial, unable to palpate        Assessment & Plan:

## 2013-07-11 NOTE — Patient Instructions (Signed)
Take water pill 1.5mg  in morning and 1.5 mg in the evening for 3 days, take with potassium Get the compression hose Pain pill refilled F/u Wed for boot removal

## 2013-07-11 NOTE — Assessment & Plan Note (Signed)
Her edema has worsened. I'll increase edema next 1.5 tablets twice a day with potassium. We'll also place her in the boots today which we will remove in 5 days.  She's been given a prescription for compression hose which can be used afterwards

## 2013-07-11 NOTE — Assessment & Plan Note (Signed)
Based on her history she has severe osteoarthritis which is generalized she also has persistent pain in her right shoulder from her previous injury. The shoulders always elevated which is how is healed. I do not think there is any intervention that is needed at this time. She can continue her pain medication

## 2013-07-16 ENCOUNTER — Ambulatory Visit (INDEPENDENT_AMBULATORY_CARE_PROVIDER_SITE_OTHER): Payer: Medicare PPO | Admitting: Family Medicine

## 2013-07-16 VITALS — BP 120/80 | HR 68 | Temp 97.3°F | Resp 18 | Ht 68.5 in | Wt 204.0 lb

## 2013-07-16 DIAGNOSIS — R609 Edema, unspecified: Secondary | ICD-10-CM

## 2013-07-16 NOTE — Patient Instructions (Signed)
Take 1.5 of the demadex once a day with potassium pill Compression hose We will call with  Blood work

## 2013-07-17 ENCOUNTER — Encounter: Payer: Self-pay | Admitting: Family Medicine

## 2013-07-17 LAB — BASIC METABOLIC PANEL
BUN: 20 mg/dL (ref 6–23)
Calcium: 9.4 mg/dL (ref 8.4–10.5)
Creat: 0.99 mg/dL (ref 0.50–1.10)

## 2013-07-17 NOTE — Assessment & Plan Note (Signed)
Status post the use of the end of boots her legs are much improved today. She still has quite edematous ankles which has been chronic for her. I will continue her on the 30 mg of demadex along with her potassium. Her renal function was checked again today. Her family members will also pick up the compression hose I have placed him at the 20-30 mg due to difficulty to put on and comfort

## 2013-07-17 NOTE — Progress Notes (Signed)
   Subjective:    Patient ID: [name], female    DOB: 1919-05-09, 79 y.o.   MRN: [id number]  HPI Patient here to followup leg swelling. One week ago she was placed in [place]na boot secondary to lower extermeity edema. She has no specific concerns today. She also has been taking 30 mg of demadex and has been urinating with this amount.   Review of Systems - per above  GEN- denies fatigue, fever, weight loss,weakness, recent illness HEENT- denies eye drainage, change in vision, nasal discharge, CVS- denies chest pain, palpitations RESP- denies SOB, cough, wheeze Neuro- denies headache, dizziness, syncope, seizure activity       Objective:   Physical Exam GEN- NAD, alert and oriented x3, in wheelchair CVS- irregular rhythm, regular rate, no murmur RESP- CTAB EXT- Edemaotus ankles only, no pre tibial edema Pulses- DP diminished bilat       Assessment & Plan:

## 2013-07-28 ENCOUNTER — Emergency Department (HOSPITAL_COMMUNITY)
Admission: EM | Admit: 2013-07-28 | Discharge: 2013-07-28 | Disposition: A | Discharge status: Home / Self Care | Payer: Medicare HMO | Attending: Emergency Medicine | Admitting: Emergency Medicine

## 2013-07-28 ENCOUNTER — Encounter (HOSPITAL_COMMUNITY): Payer: Self-pay | Admitting: Emergency Medicine

## 2013-07-28 DIAGNOSIS — I252 Old myocardial infarction: Secondary | ICD-10-CM | POA: Insufficient documentation

## 2013-07-28 DIAGNOSIS — J449 Chronic obstructive pulmonary disease, unspecified: Secondary | ICD-10-CM | POA: Insufficient documentation

## 2013-07-28 DIAGNOSIS — J4489 Other specified chronic obstructive pulmonary disease: Secondary | ICD-10-CM | POA: Insufficient documentation

## 2013-07-28 DIAGNOSIS — Z7982 Long term (current) use of aspirin: Secondary | ICD-10-CM | POA: Insufficient documentation

## 2013-07-28 DIAGNOSIS — E039 Hypothyroidism, unspecified: Secondary | ICD-10-CM | POA: Insufficient documentation

## 2013-07-28 DIAGNOSIS — Z79899 Other long term (current) drug therapy: Secondary | ICD-10-CM | POA: Insufficient documentation

## 2013-07-28 DIAGNOSIS — I10 Essential (primary) hypertension: Secondary | ICD-10-CM | POA: Insufficient documentation

## 2013-07-28 DIAGNOSIS — F329 Major depressive disorder, single episode, unspecified: Secondary | ICD-10-CM | POA: Insufficient documentation

## 2013-07-28 DIAGNOSIS — I509 Heart failure, unspecified: Secondary | ICD-10-CM | POA: Insufficient documentation

## 2013-07-28 DIAGNOSIS — R609 Edema, unspecified: Secondary | ICD-10-CM

## 2013-07-28 DIAGNOSIS — F17200 Nicotine dependence, unspecified, uncomplicated: Secondary | ICD-10-CM | POA: Insufficient documentation

## 2013-07-28 DIAGNOSIS — G40909 Epilepsy, unspecified, not intractable, without status epilepticus: Secondary | ICD-10-CM | POA: Insufficient documentation

## 2013-07-28 DIAGNOSIS — Z872 Personal history of diseases of the skin and subcutaneous tissue: Secondary | ICD-10-CM | POA: Insufficient documentation

## 2013-07-28 DIAGNOSIS — F410 Panic disorder [episodic paroxysmal anxiety] without agoraphobia: Secondary | ICD-10-CM | POA: Insufficient documentation

## 2013-07-28 DIAGNOSIS — F3289 Other specified depressive episodes: Secondary | ICD-10-CM | POA: Insufficient documentation

## 2013-07-28 DIAGNOSIS — E669 Obesity, unspecified: Secondary | ICD-10-CM | POA: Insufficient documentation

## 2013-07-28 DIAGNOSIS — I2510 Atherosclerotic heart disease of native coronary artery without angina pectoris: Secondary | ICD-10-CM | POA: Insufficient documentation

## 2013-07-28 LAB — CBC WITH DIFFERENTIAL/PLATELET
Basophils Relative: 0 % (ref 0–1)
Eosinophils Absolute: 0.2 10*3/uL (ref 0.0–0.7)
Hemoglobin: 13.9 g/dL (ref 12.0–15.0)
Lymphs Abs: 3.3 10*3/uL (ref 0.7–4.0)
MCH: 30.1 pg (ref 26.0–34.0)
MCHC: 32.8 g/dL (ref 30.0–36.0)
Monocytes Relative: 19 % — ABNORMAL HIGH (ref 3–12)
Neutro Abs: 2.2 10*3/uL (ref 1.7–7.7)
Neutrophils Relative %: 31 % — ABNORMAL LOW (ref 43–77)
Platelets: 207 10*3/uL (ref 150–400)
RBC: 4.62 MIL/uL (ref 3.87–5.11)

## 2013-07-28 LAB — BASIC METABOLIC PANEL
BUN: 27 mg/dL — ABNORMAL HIGH (ref 6–23)
Chloride: 101 mEq/L (ref 96–112)
GFR calc Af Amer: 43 mL/min — ABNORMAL LOW (ref >90)
GFR calc non Af Amer: 37 mL/min — ABNORMAL LOW (ref >90)
Potassium: 4.5 mEq/L (ref 3.5–5.1)
Sodium: 138 mEq/L (ref 135–145)

## 2013-07-28 MED ORDER — FUROSEMIDE 10 MG/ML IJ SOLN
40.0000 mg | Freq: Once | INTRAMUSCULAR | Status: AC
  Administered 2013-07-28: 40 mg via INTRAVENOUS
  Filled 2013-07-28: qty 4

## 2013-07-28 NOTE — ED Notes (Signed)
Pt ambulatory to the bathroom with assistance of walker and family. Pt did not want to use bedpan to have a BM.

## 2013-07-28 NOTE — ED Provider Notes (Signed)
CSN: [id number]     Arrival date & time 07/28/13  1438 History   First MD Initiated Contact with Patient 07/28/13 1814      This chart was scribed for Benny Lennert, MD by Arlan Organ, ED Scribe. This patient was seen in room APA18/APA18 and the patient's care was started 6:26 PM.  Chief Complaint  Patient presents with  . Leg Swelling   Patient is a 79 y.o. female presenting with leg pain. The history is provided by the patient (pt complains of swelling to the legs). No language interpreter was used.  Leg Pain Location:  Leg Pain details:    Quality:  Aching   Severity:  Mild   Onset quality:  Gradual Associated symptoms: no back pain and no fatigue     HPI Comments: [name] is a 79 y.o. Female with h/o CHF, COPD, leg edema, and myocardial infarction who presents to the Emergency Department complaining of gradually worsening, chronic bilateral leg swelling that has been persistent for a number of months, but has recently worsened in the last 2 days. Pt states she is currently on a fluid pill, which was recently changed. She states the dosage of her fluid pill was increased initially, and her PCP then instructed her to use compression stockings to help with her fluid build up. Pt states her dosage was then recently decreased due to her being too fatigue during the day. She says she has noted a significant increase in her leg swelling since the decreased dosage change. Pt states she has not taken her prescribed dose of her fluid pill today yet. She states she was recently seen by her PCP on 12/12, but did not schedule a follow up visit at that time. Pt states when she noted the new onset of the increase in leg swelling, she was unable to make an appointment with her provider due to lack of availability. She was advised to come to the ED if symptoms persisted or did not improve. She denies any other symptoms.    Past Medical History  Diagnosis Date  . Myocardial infarction      history of  . Coronary artery disease   . Leg edema     left  . Aneurysm     hx of  . COPD (chronic obstructive pulmonary disease)   . Hypertension   . Pulmonary nodule   . Obesity   . Anxiety   . Depression   . Hemoptysis   . Pressure ulcer of back     lower  . Tobacco abuse   . DJD (degenerative joint disease)   . Panic disorder   . Urinary incontinence   . Seizure disorder   . Ganglion cyst     left wrist  . Hypothyroidism   . Leg edema   . DDD (degenerative disc disease), lumbosacral   . Seizures    Past Surgical History  Procedure Laterality Date  . Appendectomy    . Cholecystectomy    . Replacement total knee  11/2000    right   . Angioplasty      times 2   History reviewed. No pertinent family history. History  Substance Use Topics  . Smoking status: Current Every Day Smoker    Types: Cigarettes  . Smokeless tobacco: Never Used  . Alcohol Use: No   OB History   Grav Para Term Preterm Abortions TAB SAB Ect Mult Living  Review of Systems  Constitutional: Negative for appetite change and fatigue.  HENT: Negative for congestion, ear discharge and sinus pressure.   Eyes: Negative for discharge.  Respiratory: Negative for cough.   Cardiovascular: Positive for leg swelling. Negative for chest pain.  Gastrointestinal: Negative for abdominal pain and diarrhea.  Genitourinary: Negative for frequency and hematuria.  Musculoskeletal: Negative for back pain.  Skin: Negative for rash.  Neurological: Negative for seizures and headaches.  Psychiatric/Behavioral: Negative for hallucinations.    Allergies  Review of patient's allergies indicates no known allergies.  Home Medications   Current Outpatient Rx  Name  Route  Sig  Dispense  Refill  . carvedilol (COREG) 3.125 MG tablet   Oral   Take 3.125 mg by mouth 2 (two) times daily.         . folic acid (FOLVITE) 1 MG tablet   Oral   Take 1 mg by mouth daily.         Marland Kitchen levETIRAcetam  (KEPPRA) 250 MG tablet   Oral   Take 250 mg by mouth 2 (two) times daily.         . potassium chloride (K-DUR) 10 MEQ tablet   Oral   Take 10 mEq by mouth daily. To be taken with Demadex (Torsemide)         . torsemide (DEMADEX) 20 MG tablet   Oral   Take 30 mg by mouth daily. *takes one and one-half tablet (30mg  total) daily         . ALPRAZolam (XANAX) 0.25 MG tablet   Oral   Take 1 tablet (0.25 mg total) by mouth at bedtime as needed for sleep.   30 tablet   2   . apixaban (ELIQUIS) 2.5 MG TABS tablet   Oral   Take 1 tablet (2.5 mg total) by mouth 2 (two) times daily.   60 tablet   6   . aspirin EC 81 MG tablet   Oral   Take 81 mg by mouth daily.         . diclofenac sodium (VOLTAREN) 1 % GEL   Topical   Apply 2 g topically every 6 (six) hours as needed (pain).   100 g   3   . HYDROcodone-acetaminophen (NORCO/VICODIN) 5-325 MG per tablet   Oral   Take 1 tablet by mouth every 6 (six) hours as needed.   90 tablet   0   . levothyroxine (SYNTHROID, LEVOTHROID) 100 MCG tablet   Oral   Take 1 tablet (100 mcg total) by mouth daily before breakfast.   30 tablet   3     Dose change   . nystatin (MYCOSTATIN/NYSTOP) 100000 UNIT/GM POWD      Apply powder to lower abdomen three times a day as needed   1 Bottle   3   . Olopatadine HCl 0.2 % SOLN   Both Eyes   Place 1 drop into both eyes daily as needed. 1 drop each eye daily as needed(dry eyes)         . polyethylene glycol powder (GLYCOLAX/MIRALAX) powder   Oral   Take 17 g by mouth daily.   3350 g   1   . simvastatin (ZOCOR) 40 MG tablet   Oral   Take 1 tablet (40 mg total) by mouth at bedtime.   30 tablet   3   . VYTORIN 10-80 MG per tablet   Oral   Take 1 tablet by mouth daily.  Triage Vitals: BP 120/60  Pulse 56  Temp(Src) 97.3 F (36.3 C) (Oral)  Resp 18  Ht 5' 8.5" (1.74 m)  Wt 206 lb (93.441 kg)  BMI 30.86 kg/m2  SpO2 97%  Physical Exam  Constitutional: She is  oriented to person, place, and time. She appears well-developed.  HENT:  Head: Normocephalic.  Eyes: Conjunctivae and EOM are normal. No scleral icterus.  Neck: Neck supple. No thyromegaly present.  Cardiovascular: Normal rate and regular rhythm.  Exam reveals no gallop and no friction rub.   No murmur heard. Pulmonary/Chest: No stridor. She has no wheezes. She has no rales. She exhibits no tenderness.  Abdominal: She exhibits no distension. There is no tenderness. There is no rebound.  Musculoskeletal: Normal range of motion. She exhibits edema.  2 plus edema below the knees bilaterally   Lymphadenopathy:    She has no cervical adenopathy.  Neurological: She is oriented to person, place, and time. She exhibits normal muscle tone. Coordination normal.  Skin: No rash noted. No erythema.  Psychiatric: She has a normal mood and affect. Her behavior is normal.    ED Course  Procedures (including critical care time)  DIAGNOSTIC STUDIES: Oxygen Saturation is 97% on RA, Normal by my interpretation.    COORDINATION OF CARE: 6:31 PM- Will order blood panel. Will give lasix. Discussed treatment plan with pt at bedside and pt agreed to plan.     Labs Review Labs Reviewed  CBC WITH DIFFERENTIAL - Abnormal; Notable for the following:    Neutrophils Relative % 31 (*)    Lymphocytes Relative 48 (*)    Monocytes Relative 19 (*)    Monocytes Absolute 1.3 (*)    All other components within normal limits  BASIC METABOLIC PANEL - Abnormal; Notable for the following:    BUN 27 (*)    Creatinine, Ser 1.22 (*)    GFR calc non Af Amer 37 (*)    GFR calc Af Amer 43 (*)    All other components within normal limits   Imaging Review No results found.  EKG Interpretation   None       MDM  The chart was scribed for me under my direct supervision.  I personally performed the history, physical, and medical decision making and all procedures in the evaluation of this patient.Benny Lennert, MD 07/28/13 [phone]

## 2013-07-28 NOTE — ED Notes (Signed)
Assisted to bedside commode to void

## 2013-07-28 NOTE — ED Notes (Signed)
Pt with increased swelling to bilateral ankles and feet, denies SOB, denies pain

## 2013-07-28 NOTE — ED Notes (Signed)
Patient given discharge instruction, verbalized understand. IV removed, band aid applied. Patient in wheelchair to car.

## 2013-07-29 ENCOUNTER — Telehealth: Payer: Self-pay | Admitting: *Deleted

## 2013-07-29 NOTE — Telephone Encounter (Signed)
Rose called back wanting to know if pt can take 2 (40mg s)of her toresmide?

## 2013-07-29 NOTE — Telephone Encounter (Signed)
She can take 1 tablet twice a day with a potassium pill , do this for the next week

## 2013-07-29 NOTE — Telephone Encounter (Signed)
Rose from [facility] called stating that [name] went to ED yesterday with cramping in the legs and edema, ED doc wants her to increase her potassium to 2 pills a day. Rose wants to know if this will be ok with you?

## 2013-07-29 NOTE — Telephone Encounter (Signed)
Okay to increase to 1 tablet twice a day Also make sure she is taking demadex 1.5 tablets once a day for swelling

## 2013-07-30 ENCOUNTER — Telehealth: Payer: Self-pay | Admitting: Adult Health

## 2013-07-30 MED ORDER — APIXABAN 2.5 MG PO TABS: 2.5000 mg | ORAL_TABLET | Freq: Two times a day (BID) | ORAL | Status: AC

## 2013-07-30 NOTE — Telephone Encounter (Signed)
Medication sent via escribe.  

## 2013-07-30 NOTE — Telephone Encounter (Signed)
Received fax refill request  Rx # [id number] Medication:  Eliquis 2.5 mg  Qty 60 Sig:  Take one tablet by mouth twice daily Physician:  Lyman Bishop

## 2013-07-31 ENCOUNTER — Other Ambulatory Visit: Payer: Self-pay | Admitting: Family Medicine

## 2013-07-31 NOTE — Telephone Encounter (Signed)
Medication refilled per protocol. 

## 2013-08-13 ENCOUNTER — Emergency Department (HOSPITAL_COMMUNITY): Payer: Medicare HMO

## 2013-08-13 ENCOUNTER — Encounter (HOSPITAL_COMMUNITY): Payer: Self-pay | Admitting: Emergency Medicine

## 2013-08-13 ENCOUNTER — Inpatient Hospital Stay (HOSPITAL_COMMUNITY)
Admission: EM | Admit: 2013-08-13 | Discharge: 2013-08-16 | DRG: 191 | Disposition: A | Discharge status: Skilled Nursing Facility | Payer: Medicare HMO | Attending: Family Medicine | Admitting: Family Medicine

## 2013-08-13 DIAGNOSIS — N390 Urinary tract infection, site not specified: Secondary | ICD-10-CM

## 2013-08-13 DIAGNOSIS — Z8249 Family history of ischemic heart disease and other diseases of the circulatory system: Secondary | ICD-10-CM

## 2013-08-13 DIAGNOSIS — B372 Candidiasis of skin and nail: Secondary | ICD-10-CM

## 2013-08-13 DIAGNOSIS — R531 Weakness: Secondary | ICD-10-CM

## 2013-08-13 DIAGNOSIS — F3289 Other specified depressive episodes: Secondary | ICD-10-CM | POA: Diagnosis present

## 2013-08-13 DIAGNOSIS — J440 Chronic obstructive pulmonary disease with acute lower respiratory infection: Principal | ICD-10-CM | POA: Diagnosis present

## 2013-08-13 DIAGNOSIS — Z79899 Other long term (current) drug therapy: Secondary | ICD-10-CM

## 2013-08-13 DIAGNOSIS — M51379 Other intervertebral disc degeneration, lumbosacral region without mention of lumbar back pain or lower extremity pain: Secondary | ICD-10-CM | POA: Diagnosis present

## 2013-08-13 DIAGNOSIS — J4489 Other specified chronic obstructive pulmonary disease: Secondary | ICD-10-CM | POA: Diagnosis present

## 2013-08-13 DIAGNOSIS — Z96659 Presence of unspecified artificial knee joint: Secondary | ICD-10-CM

## 2013-08-13 DIAGNOSIS — L02419 Cutaneous abscess of limb, unspecified: Secondary | ICD-10-CM | POA: Diagnosis present

## 2013-08-13 DIAGNOSIS — F17200 Nicotine dependence, unspecified, uncomplicated: Secondary | ICD-10-CM | POA: Diagnosis present

## 2013-08-13 DIAGNOSIS — G40909 Epilepsy, unspecified, not intractable, without status epilepticus: Secondary | ICD-10-CM | POA: Diagnosis present

## 2013-08-13 DIAGNOSIS — I252 Old myocardial infarction: Secondary | ICD-10-CM

## 2013-08-13 DIAGNOSIS — E039 Hypothyroidism, unspecified: Secondary | ICD-10-CM | POA: Diagnosis present

## 2013-08-13 DIAGNOSIS — I2510 Atherosclerotic heart disease of native coronary artery without angina pectoris: Secondary | ICD-10-CM | POA: Diagnosis present

## 2013-08-13 DIAGNOSIS — Z7982 Long term (current) use of aspirin: Secondary | ICD-10-CM

## 2013-08-13 DIAGNOSIS — M1990 Unspecified osteoarthritis, unspecified site: Secondary | ICD-10-CM | POA: Diagnosis present

## 2013-08-13 DIAGNOSIS — E860 Dehydration: Secondary | ICD-10-CM | POA: Diagnosis present

## 2013-08-13 DIAGNOSIS — R197 Diarrhea, unspecified: Secondary | ICD-10-CM | POA: Diagnosis present

## 2013-08-13 DIAGNOSIS — J449 Chronic obstructive pulmonary disease, unspecified: Secondary | ICD-10-CM | POA: Diagnosis present

## 2013-08-13 DIAGNOSIS — F411 Generalized anxiety disorder: Secondary | ICD-10-CM | POA: Diagnosis present

## 2013-08-13 DIAGNOSIS — F329 Major depressive disorder, single episode, unspecified: Secondary | ICD-10-CM | POA: Diagnosis present

## 2013-08-13 DIAGNOSIS — J209 Acute bronchitis, unspecified: Principal | ICD-10-CM | POA: Diagnosis present

## 2013-08-13 DIAGNOSIS — R569 Unspecified convulsions: Secondary | ICD-10-CM | POA: Diagnosis present

## 2013-08-13 DIAGNOSIS — I4891 Unspecified atrial fibrillation: Secondary | ICD-10-CM | POA: Diagnosis present

## 2013-08-13 DIAGNOSIS — L03119 Cellulitis of unspecified part of limb: Secondary | ICD-10-CM | POA: Diagnosis present

## 2013-08-13 DIAGNOSIS — I10 Essential (primary) hypertension: Secondary | ICD-10-CM | POA: Diagnosis present

## 2013-08-13 DIAGNOSIS — M5137 Other intervertebral disc degeneration, lumbosacral region: Secondary | ICD-10-CM | POA: Diagnosis present

## 2013-08-13 DIAGNOSIS — N179 Acute kidney failure, unspecified: Secondary | ICD-10-CM | POA: Diagnosis present

## 2013-08-13 DIAGNOSIS — Z7901 Long term (current) use of anticoagulants: Secondary | ICD-10-CM

## 2013-08-13 LAB — COMPREHENSIVE METABOLIC PANEL
ALT: 9 U/L (ref 0–35)
AST: 26 U/L (ref 0–37)
Albumin: 3.7 g/dL (ref 3.5–5.2)
Alkaline Phosphatase: 92 U/L (ref 39–117)
BUN: 34 mg/dL — ABNORMAL HIGH (ref 6–23)
CO2: 27 mEq/L (ref 19–32)
Calcium: 9.6 mg/dL (ref 8.4–10.5)
Chloride: 99 mEq/L (ref 96–112)
Creatinine, Ser: 1.45 mg/dL — ABNORMAL HIGH (ref 0.50–1.10)
GFR calc Af Amer: 35 mL/min — ABNORMAL LOW (ref >90)
GFR calc non Af Amer: 30 mL/min — ABNORMAL LOW (ref >90)
Glucose, Bld: 78 mg/dL (ref 70–99)
Potassium: 4.1 mEq/L (ref 3.7–5.3)
Sodium: 139 mEq/L (ref 137–147)
Total Bilirubin: 0.5 mg/dL (ref 0.3–1.2)
Total Protein: 7.3 g/dL (ref 6.0–8.3)

## 2013-08-13 LAB — CBC WITH DIFFERENTIAL/PLATELET
Basophils Absolute: 0 10*3/uL (ref 0.0–0.1)
Basophils Relative: 0 % (ref 0–1)
Eosinophils Absolute: 0 10*3/uL (ref 0.0–0.7)
Eosinophils Relative: 1 % (ref 0–5)
HCT: 42.4 % (ref 36.0–46.0)
Hemoglobin: 13.8 g/dL (ref 12.0–15.0)
Lymphocytes Relative: 43 % (ref 12–46)
Lymphs Abs: 1.9 10*3/uL (ref 0.7–4.0)
MCH: 29.8 pg (ref 26.0–34.0)
MCHC: 32.5 g/dL (ref 30.0–36.0)
MCV: 91.6 fL (ref 78.0–100.0)
Monocytes Absolute: 0.9 10*3/uL (ref 0.1–1.0)
Monocytes Relative: 22 % — ABNORMAL HIGH (ref 3–12)
Neutro Abs: 1.5 10*3/uL — ABNORMAL LOW (ref 1.7–7.7)
Neutrophils Relative %: 34 % — ABNORMAL LOW (ref 43–77)
Platelets: 186 10*3/uL (ref 150–400)
RBC: 4.63 MIL/uL (ref 3.87–5.11)
RDW: 13.3 % (ref 11.5–15.5)
WBC: 4.4 10*3/uL (ref 4.0–10.5)

## 2013-08-13 LAB — URINE MICROSCOPIC-ADD ON

## 2013-08-13 LAB — URINALYSIS, ROUTINE W REFLEX MICROSCOPIC
Bilirubin Urine: NEGATIVE
Glucose, UA: NEGATIVE mg/dL
Ketones, ur: NEGATIVE mg/dL
Nitrite: NEGATIVE
Protein, ur: NEGATIVE mg/dL
Specific Gravity, Urine: >1.03 — ABNORMAL HIGH (ref 1.005–1.030)
Urobilinogen, UA: 0.2 mg/dL (ref 0.0–1.0)
pH: 5 (ref 5.0–8.0)

## 2013-08-13 MED ORDER — DEXTROSE 5 % IV SOLN
1.0000 g | Freq: Once | INTRAVENOUS | Status: AC
  Administered 2013-08-13: 1 g via INTRAVENOUS
  Filled 2013-08-13: qty 10

## 2013-08-13 MED ORDER — SODIUM CHLORIDE 0.9 % IV BOLUS (SEPSIS)
500.0000 mL | Freq: Once | INTRAVENOUS | Status: AC
  Administered 2013-08-13: 500 mL via INTRAVENOUS

## 2013-08-13 MED ORDER — VANCOMYCIN HCL IN DEXTROSE 1-5 GM/200ML-% IV SOLN
1000.0000 mg | Freq: Once | INTRAVENOUS | Status: AC
  Administered 2013-08-13: 1000 mg via INTRAVENOUS
  Filled 2013-08-13: qty 200

## 2013-08-13 MED ORDER — GUAIFENESIN-CODEINE 100-10 MG/5ML PO SOLN
5.0000 mL | Freq: Once | ORAL | Status: AC
  Administered 2013-08-13: 5 mL via ORAL
  Filled 2013-08-13: qty 5

## 2013-08-13 MED ORDER — ALBUTEROL SULFATE (2.5 MG/3ML) 0.083% IN NEBU
2.5000 mg | INHALATION_SOLUTION | Freq: Once | RESPIRATORY_TRACT | Status: AC
  Administered 2013-08-13: 2.5 mg via RESPIRATORY_TRACT
  Filled 2013-08-13: qty 3

## 2013-08-13 NOTE — ED Notes (Signed)
Report given to floor,  

## 2013-08-13 NOTE — ED Notes (Signed)
Dr. Rito Ehrlich at bedside, pt and family  Updated on plan of care,

## 2013-08-13 NOTE — ED Provider Notes (Signed)
CSN: [id number]     Arrival date & time 08/13/13  1646 History  This chart was scribed for Raeford Razor, MD by Blanchard Kelch, ED Scribe. The patient was seen in room APA05/APA05. Patient's care was started at 6:07 PM.     Chief Complaint  Patient presents with  . Cough    Patient is a 79 y.o. female presenting with cough. The history is provided by the patient and a relative. No language interpreter was used.  Cough Associated symptoms: fever and myalgias   Associated symptoms: no chest pain     HPI Comments: [name] is a 79 y.o. female with a history of COPD, CAD, MI, HTN who presents to the Emergency Department complaining of constant productive cough that began two days ago. She states she has also had diarrhea, fatigue, appetite loss, malaise, generalized weakness, leg pain and leg swelling that she states she has at baseline (left usually worse than right). Today she states her right leg is more swollen than her left and red, which is abnormal. She states she has been unable to walk normally at home recently due to weakness. She has been using Mucinex DM for the cough without relief. She denies nausea, abdominal pain or chest pain. She denies sick contacts that she knows of. She states that she received her flu vaccination about a month ago. She has been on fluid pills for "a long time." Her family states she lives alone at home.   Past Medical History  Diagnosis Date  . Myocardial infarction     history of  . Coronary artery disease   . Leg edema     left  . Aneurysm     hx of  . COPD (chronic obstructive pulmonary disease)   . Hypertension   . Pulmonary nodule   . Obesity   . Anxiety   . Depression   . Hemoptysis   . Pressure ulcer of back     lower  . Tobacco abuse   . DJD (degenerative joint disease)   . Panic disorder   . Urinary incontinence   . Seizure disorder   . Ganglion cyst     left wrist  . Hypothyroidism   . Leg edema   . DDD (degenerative  disc disease), lumbosacral   . Seizures    Past Surgical History  Procedure Laterality Date  . Appendectomy    . Cholecystectomy    . Replacement total knee  11/2000    right   . Angioplasty      times 2   History reviewed. No pertinent family history. History  Substance Use Topics  . Smoking status: Current Every Day Smoker    Types: Cigarettes  . Smokeless tobacco: Never Used  . Alcohol Use: No   OB History   Grav Para Term Preterm Abortions TAB SAB Ect Mult Living                 Review of Systems  Constitutional: Positive for fever, appetite change and fatigue.  Respiratory: Positive for cough.   Cardiovascular: Positive for leg swelling. Negative for chest pain.  Gastrointestinal: Positive for diarrhea. Negative for nausea, vomiting and abdominal pain.  Musculoskeletal: Positive for myalgias.  Skin: Positive for color change.  Neurological: Positive for weakness.  All other systems reviewed and are negative.    Allergies  Review of patient's allergies indicates no known allergies.  Home Medications   Current Outpatient Rx  Name  Route  Sig  Dispense  Refill  . ALPRAZolam (XANAX) 0.25 MG tablet   Oral   Take 1 tablet (0.25 mg total) by mouth at bedtime as needed for sleep.   30 tablet   2   . apixaban (ELIQUIS) 2.5 MG TABS tablet   Oral   Take 1 tablet (2.5 mg total) by mouth 2 (two) times daily.   60 tablet   6   . aspirin EC 81 MG tablet   Oral   Take 81 mg by mouth daily.         . carvedilol (COREG) 3.125 MG tablet   Oral   Take 3.125 mg by mouth 2 (two) times daily.         . diclofenac sodium (VOLTAREN) 1 % GEL   Topical   Apply 2 g topically every 6 (six) hours as needed (pain).   100 g   3   . folic acid (FOLVITE) 1 MG tablet   Oral   Take 1 mg by mouth daily.         Marland Kitchen HYDROcodone-acetaminophen (NORCO/VICODIN) 5-325 MG per tablet   Oral   Take 1 tablet by mouth at bedtime as needed and may repeat dose one time if needed for  moderate pain or severe pain.         Marland Kitchen levETIRAcetam (KEPPRA) 250 MG tablet   Oral   Take 250 mg by mouth 2 (two) times daily.         Marland Kitchen levothyroxine (SYNTHROID, LEVOTHROID) 100 MCG tablet   Oral   Take 1 tablet (100 mcg total) by mouth daily before breakfast.   30 tablet   3     Dose change   . Olopatadine HCl 0.2 % SOLN   Both Eyes   Place 1 drop into both eyes daily as needed. 1 drop each eye daily as needed(dry eyes)         . potassium chloride (K-DUR) 10 MEQ tablet   Oral   Take 10 mEq by mouth daily. To be taken with Demadex (Torsemide)         . simvastatin (ZOCOR) 40 MG tablet   Oral   Take 40 mg by mouth at bedtime.         . torsemide (DEMADEX) 20 MG tablet   Oral   Take 30 mg by mouth daily. *takes one and one-half tablet (30mg  total) daily          Triage Vitals: BP 107/72  Pulse 89  Temp(Src) 98.6 F (37 C) (Oral)  Resp 20  Ht 5\' 8"  (1.727 m)  Wt 190 lb (86.183 kg)  BMI 28.90 kg/m2  SpO2 100%  Physical Exam  Nursing note and vitals reviewed. Constitutional: She is oriented to person, place, and time. She appears well-developed and well-nourished.  HENT:  Head: Normocephalic and atraumatic.  Eyes: Conjunctivae and EOM are normal. Pupils are equal, round, and reactive to light.  Neck: Normal range of motion. Neck supple.  Cardiovascular: Normal rate and normal heart sounds.  An irregularly irregular rhythm present.  No murmur heard. Irregularly irregular. No murmur  Pulmonary/Chest: Effort normal. No accessory muscle usage. She has wheezes.  Faint expiratory wheezing. No accessory muscle use. Speaking in complete sentences.   Abdominal: Soft. Bowel sounds are normal.  Musculoskeletal: Normal range of motion. She exhibits edema.  Pitting lower extremity edema, right worse than left. Her right mid to distal shin has increased warmth and erythema. Neurovascularly intact distally.   Neurological: She is  alert and oriented to person, place,  and time.  Skin: Skin is warm and dry. There is erythema.  Psychiatric: She has a normal mood and affect. Her behavior is normal.    ED Course  Procedures (including critical care time)  DIAGNOSTIC STUDIES: Oxygen Saturation is 100% on room air, normal by my interpretation.    COORDINATION OF CARE: 6:12 PM - Patient verbalizes understanding and agrees with treatment plan.    Labs Review Labs Reviewed  CBC WITH DIFFERENTIAL - Abnormal; Notable for the following:    Neutrophils Relative % 34 (*)    Neutro Abs 1.5 (*)    Monocytes Relative 22 (*)    All other components within normal limits  COMPREHENSIVE METABOLIC PANEL - Abnormal; Notable for the following:    BUN 34 (*)    Creatinine, Ser 1.45 (*)    GFR calc non Af Amer 30 (*)    GFR calc Af Amer 35 (*)    All other components within normal limits   Imaging Review Dg Chest 2 View  08/13/2013   CLINICAL DATA:  Cough and weakness with lower extremity swelling several days.  EXAM: CHEST  2 VIEW  COMPARISON:  11/08/2012 and 10/31/2012  FINDINGS: The lungs are adequately inflated without focal consolidation or effusion. There is minimal stable elevation of the left hemidiaphragm. There is moderate stable cardiomegaly. There is mild calcified plaque of the thoracic aorta. There are moderate degenerative changes of the spine.  IMPRESSION: No acute cardiopulmonary disease.  Moderate stable cardiomegaly.   Electronically Signed   By: Elberta Fortis M.D.   On: 08/13/2013 17:38    EKG Interpretation   None       MDM   1. Acute bronchitis   2. UTI (lower urinary tract infection)   3. Cellulitis of lower leg   4. Acute diarrhea   5. Atrial fibrillation   6. Generalized weakness   7. Candidal intertrigo    79 year old female with generalized fatigue. Exam is consistent with a right lower leg cellulitis. Doubt DVT. UA is concerning for possible urinary tract infection. Given her age and degree of symptoms, will admit for further  treatment and monitor clinical response to improvement.  I personally preformed the services scribed in my presence. The recorded information has been reviewed is accurate. Raeford Razor, MD.    Raeford Razor, MD 08/17/13 [phone]

## 2013-08-13 NOTE — ED Notes (Signed)
Cough, feels weak, swelling of feet and ankles,  Diarrhea.   Nausea, no vomiting.

## 2013-08-13 NOTE — H&P (Signed)
[facility] History and Physical  [name] YQM:[id number] DOB: [birth year] DOA: 08/13/2013   PCP: Milinda Antis, MD  Specialists: Has been followed by cardiology in the past  Chief Complaint: Weakness, cough, leg swelling  HPI: [name] is a 79 y.o. female with a past medical history of atrial fibrillation on anticoagulation, seizure disorder, who was in her usual state of health about 2-3 days ago, when she started developing a cough with yellowish expectoration. Denies any blood in the sputum. She's also started having diarrhea with the 3-4 loose stools on a daily basis. Unfortunately, patient is not a very good historian, so it was very difficult to get any information from her. Hence history is limited. She denies any fever, but has been having chills. Denies any sick contacts. She has had wheezing along with this cough. Has had nausea, but no vomiting. Denies any abdominal pain. Has been weak, and has lost her appetite. The symptoms have been getting worse, so she decided to come in to the hospital. She's also noticed worsening leg swelling in both the legs. The right leg has appeared to be more warm to touch. She's not sure if she has been on antibiotics recently.  Home Medications: Prior to Admission medications   Medication Sig Start Date End Date Taking? Authorizing Provider  ALPRAZolam (XANAX) 0.25 MG tablet Take 1 tablet (0.25 mg total) by mouth at bedtime as needed for sleep. 06/13/13  Yes Salley Scarlet, MD  apixaban (ELIQUIS) 2.5 MG TABS tablet Take 1 tablet (2.5 mg total) by mouth 2 (two) times daily. 07/30/13  Yes Jodelle Gross, NP  aspirin EC 81 MG tablet Take 81 mg by mouth daily.   Yes Historical Provider, MD  carvedilol (COREG) 3.125 MG tablet Take 3.125 mg by mouth 2 (two) times daily.   Yes Historical Provider, MD  diclofenac sodium (VOLTAREN) 1 % GEL Apply 2 g topically every 6 (six) hours as needed (pain). 10/08/12  Yes Salley Scarlet, MD  folic  acid (FOLVITE) 1 MG tablet Take 1 mg by mouth daily.   Yes Historical Provider, MD  HYDROcodone-acetaminophen (NORCO/VICODIN) 5-325 MG per tablet Take 1 tablet by mouth at bedtime as needed and may repeat dose one time if needed for moderate pain or severe pain.   Yes Historical Provider, MD  levETIRAcetam (KEPPRA) 250 MG tablet Take 250 mg by mouth 2 (two) times daily.   Yes Historical Provider, MD  levothyroxine (SYNTHROID, LEVOTHROID) 100 MCG tablet Take 1 tablet (100 mcg total) by mouth daily before breakfast. 02/13/13  Yes Salley Scarlet, MD  Olopatadine HCl 0.2 % SOLN Place 1 drop into both eyes daily as needed. 1 drop each eye daily as needed(dry eyes) 10/08/12  Yes Salley Scarlet, MD  potassium chloride (K-DUR) 10 MEQ tablet Take 10 mEq by mouth daily. To be taken with Demadex (Torsemide)   Yes Historical Provider, MD  simvastatin (ZOCOR) 40 MG tablet Take 40 mg by mouth at bedtime. 07/31/13  Yes Historical Provider, MD  torsemide (DEMADEX) 20 MG tablet Take 30 mg by mouth daily. *takes one and one-half tablet (30mg  total) daily   Yes Historical Provider, MD    Allergies: No Known Allergies  Past Medical History: Past Medical History  Diagnosis Date  . Myocardial infarction     history of  . Coronary artery disease   . Leg edema     left  . Aneurysm     hx of  . COPD (chronic  obstructive pulmonary disease)   . Hypertension   . Pulmonary nodule   . Obesity   . Anxiety   . Depression   . Hemoptysis   . Pressure ulcer of back     lower  . Tobacco abuse   . DJD (degenerative joint disease)   . Panic disorder   . Urinary incontinence   . Seizure disorder   . Ganglion cyst     left wrist  . Hypothyroidism   . Leg edema   . DDD (degenerative disc disease), lumbosacral   . Seizures     Past Surgical History  Procedure Laterality Date  . Appendectomy    . Cholecystectomy    . Replacement total knee  11/2000    right   . Angioplasty      times 2    Social History:  She lives by herself. There is a lot of family support. Continues to smoke, however, hasn't smoked in this week. She smokes about half to one third of a pack on a daily basis. No alcohol use. No illicit drug use. Is able to ambulate at home.  Family History:  Family History  Problem Relation Age of Onset  . Heart disease Father      Review of Systems - History obtained from the patient General ROS: positive for  - fatigue Psychological ROS: negative Ophthalmic ROS: negative ENT ROS: negative Allergy and Immunology ROS: negative Hematological and Lymphatic ROS: negative Endocrine ROS: negative Respiratory ROS: as in hpi Cardiovascular ROS: no chest pain or dyspnea on exertion Gastrointestinal ROS: as in hpi Genito-Urinary ROS: no dysuria, trouble voiding, or hematuria Musculoskeletal ROS: negative Neurological ROS: no TIA or stroke symptoms Dermatological ROS: negative  Physical Examination  Filed Vitals:   08/13/13 1712 08/13/13 1907 08/13/13 2222  BP: 107/72  122/62  Pulse: 89  53  Temp: 98.6 F (37 C)  98.4 F (36.9 C)  TempSrc: Oral  Oral  Resp: 20  20  Height: 5\' 8"  (1.727 m)    Weight: 86.183 kg (190 lb)    SpO2: 100% 98% 97%    General appearance: alert, cooperative, appears stated age and no distress Head: Normocephalic, without obvious abnormality, atraumatic Eyes: conjunctivae/corneas clear. PERRL, EOM's intact. Throat: slightly dry mm Neck: no adenopathy, no carotid bruit, no JVD, supple, symmetrical, trachea midline and thyroid not enlarged, symmetric, no tenderness/mass/nodules Resp: end expiratory wheezing bilaterally. No crackles. Cardio: S1-S2 is irregularly irregular. No S3, S4. No rubs, murmurs, or bruits. Pedal edema is noted in both lower extremities, 2+ GI: soft, non-tender; bowel sounds normal; no masses,  no organomegaly Extremities: Edema as mentioned above, along with some erythema and warmth over the right lower extremity. No calf  tenderness Pulses: 2+ and symmetric Skin: Mild erythema over the right lower extremity. Neurologic: Alert and oriented x3. No focal neurological deficits are present  Laboratory Data: Results for orders placed during the hospital encounter of 08/13/13 (from the past 48 hour(s))  CBC WITH DIFFERENTIAL     Status: Abnormal   Collection Time    08/13/13  5:35 PM      Result Value Range   WBC 4.4  4.0 - 10.5 K/uL   RBC 4.63  3.87 - 5.11 MIL/uL   Hemoglobin 13.8  12.0 - 15.0 g/dL   HCT 41.3  24.4 - 01.0 %   MCV 91.6  78.0 - 100.0 fL   MCH 29.8  26.0 - 34.0 pg   MCHC 32.5  30.0 - 36.0 g/dL  RDW 13.3  11.5 - 15.5 %   Platelets 186  150 - 400 K/uL   Neutrophils Relative % 34 (*) 43 - 77 %   Neutro Abs 1.5 (*) 1.7 - 7.7 K/uL   Lymphocytes Relative 43  12 - 46 %   Lymphs Abs 1.9  0.7 - 4.0 K/uL   Monocytes Relative 22 (*) 3 - 12 %   Monocytes Absolute 0.9  0.1 - 1.0 K/uL   Eosinophils Relative 1  0 - 5 %   Eosinophils Absolute 0.0  0.0 - 0.7 K/uL   Basophils Relative 0  0 - 1 %   Basophils Absolute 0.0  0.0 - 0.1 K/uL  COMPREHENSIVE METABOLIC PANEL     Status: Abnormal   Collection Time    08/13/13  5:35 PM      Result Value Range   Sodium 139  137 - 147 mEq/L   Comment: Please note change in reference range.   Potassium 4.1  3.7 - 5.3 mEq/L   Comment: Please note change in reference range.   Chloride 99  96 - 112 mEq/L   CO2 27  19 - 32 mEq/L   Glucose, Bld 78  70 - 99 mg/dL   BUN 34 (*) 6 - 23 mg/dL   Creatinine, Ser 4.09 (*) 0.50 - 1.10 mg/dL   Calcium 9.6  8.4 - 81.1 mg/dL   Total Protein 7.3  6.0 - 8.3 g/dL   Albumin 3.7  3.5 - 5.2 g/dL   AST 26  0 - 37 U/L   ALT 9  0 - 35 U/L   Alkaline Phosphatase 92  39 - 117 U/L   Total Bilirubin 0.5  0.3 - 1.2 mg/dL   GFR calc non Af Amer 30 (*) >90 mL/min   GFR calc Af Amer 35 (*) >90 mL/min   Comment: (NOTE)     The eGFR has been calculated using the CKD EPI equation.     This calculation has not been validated in all clinical  situations.     eGFR's persistently <90 mL/min signify possible Chronic Kidney     Disease.  URINALYSIS, ROUTINE W REFLEX MICROSCOPIC     Status: Abnormal   Collection Time    08/13/13  6:57 PM      Result Value Range   Color, Urine YELLOW  YELLOW   APPearance CLEAR  CLEAR   Specific Gravity, Urine >1.030 (*) 1.005 - 1.030   pH 5.0  5.0 - 8.0   Glucose, UA NEGATIVE  NEGATIVE mg/dL   Hgb urine dipstick LARGE (*) NEGATIVE   Bilirubin Urine NEGATIVE  NEGATIVE   Ketones, ur NEGATIVE  NEGATIVE mg/dL   Protein, ur NEGATIVE  NEGATIVE mg/dL   Urobilinogen, UA 0.2  0.0 - 1.0 mg/dL   Nitrite NEGATIVE  NEGATIVE   Leukocytes, UA MODERATE (*) NEGATIVE  URINE MICROSCOPIC-ADD ON     Status: Abnormal   Collection Time    08/13/13  6:57 PM      Result Value Range   Squamous Epithelial / LPF FEW (*) RARE   WBC, UA TOO NUMEROUS TO COUNT  <3 WBC/hpf   RBC / HPF 11-20  <3 RBC/hpf   Bacteria, UA FEW (*) RARE   Urine-Other FEW YEAST      Radiology Reports: Dg Chest 2 View  08/13/2013   CLINICAL DATA:  Cough and weakness with lower extremity swelling several days.  EXAM: CHEST  2 VIEW  COMPARISON:  11/08/2012 and 10/31/2012  FINDINGS: The lungs are adequately inflated without focal consolidation or effusion. There is minimal stable elevation of the left hemidiaphragm. There is moderate stable cardiomegaly. There is mild calcified plaque of the thoracic aorta. There are moderate degenerative changes of the spine.  IMPRESSION: No acute cardiopulmonary disease.  Moderate stable cardiomegaly.   Electronically Signed   By: Elberta Fortis M.D.   On: 08/13/2013 17:38     Problem List  Principal Problem:   Acute bronchitis Active Problems:   HYPOTHYROIDISM   TOBACCO ABUSE   CORONARY ARTERY DISEASE   COPD   SEIZURE DISORDER   Atrial fibrillation   Acute diarrhea   Cellulitis of lower leg   UTI (lower urinary tract infection)   Assessment: This is a 79 year old, African American female, who  presents with cough, wheezing. She appears to have acute bronchitis. No infiltrate noted on chest x-ray. She also appears to have cellulitis of her right lower extremities. She has had leg swelling for which she is being on diuretics. DVT will need to be ruled out, although it's less likely considering that she is already on anticoagulation. She also has an urinary tract infection  Plan: #1 acute bronchitis: She'll be treated with nebulizer treatments, steroids, and antibiotics. Smoking cessation counseling was provided. Aware that she is on beta blocker, which will be continued for now.  #2 urinary tract infection: Should, be covered by Levaquin. Urine cultures will be followed up on.  #3 cellulitis of the right lower extremity: We'll continue with vancomycin per pharmacy. Venous Dopplers will be obtained.  #4 seizure disorder: Continue with Keppra.  #5 history of atrial fibrillation: Heart rate is normal. She does have a irregular rhythm. Continue with her beta blocker and her anticoagulation.  #6 acute diarrhea: Probably due to to her acute illness. However, we will rule out C. difficile. Contact precautions will be utilized.  #7 elevated BUN and creatinine: She likely has chronic kidney disease. Continue to monitor.   DVT Prophylaxis: She is on full anticoagulation Code Status: Full code Family Communication: Discussed with the patient and daughter and her son  Disposition Plan: Admit to telemetry   Further management decisions will depend on results of further testing and patient's response to treatment.  [facility]  [facility] Pager [phone]  If 7PM-7AM, please contact night-coverage [email or web address] Password [facility]  08/13/2013, 11:51 PM

## 2013-08-14 ENCOUNTER — Inpatient Hospital Stay (HOSPITAL_COMMUNITY): Payer: Medicare HMO

## 2013-08-14 ENCOUNTER — Encounter (HOSPITAL_COMMUNITY): Payer: Self-pay | Admitting: *Deleted

## 2013-08-14 DIAGNOSIS — N390 Urinary tract infection, site not specified: Secondary | ICD-10-CM

## 2013-08-14 DIAGNOSIS — L03119 Cellulitis of unspecified part of limb: Secondary | ICD-10-CM

## 2013-08-14 DIAGNOSIS — I4891 Unspecified atrial fibrillation: Secondary | ICD-10-CM

## 2013-08-14 DIAGNOSIS — J209 Acute bronchitis, unspecified: Secondary | ICD-10-CM

## 2013-08-14 DIAGNOSIS — L02419 Cutaneous abscess of limb, unspecified: Secondary | ICD-10-CM

## 2013-08-14 LAB — COMPREHENSIVE METABOLIC PANEL
ALT: 7 U/L (ref 0–35)
AST: 23 U/L (ref 0–37)
Albumin: 3.2 g/dL — ABNORMAL LOW (ref 3.5–5.2)
Alkaline Phosphatase: 79 U/L (ref 39–117)
BILIRUBIN TOTAL: 0.4 mg/dL (ref 0.3–1.2)
BUN: 29 mg/dL — ABNORMAL HIGH (ref 6–23)
CO2: 25 mEq/L (ref 19–32)
Calcium: 8.7 mg/dL (ref 8.4–10.5)
Chloride: 102 mEq/L (ref 96–112)
Creatinine, Ser: 1.27 mg/dL — ABNORMAL HIGH (ref 0.50–1.10)
GFR calc non Af Amer: 36 mL/min — ABNORMAL LOW (ref >90)
GFR, EST AFRICAN AMERICAN: 41 mL/min — AB (ref >90)
Glucose, Bld: 70 mg/dL (ref 70–99)
POTASSIUM: 3.4 meq/L — AB (ref 3.7–5.3)
SODIUM: 140 meq/L (ref 137–147)
TOTAL PROTEIN: 6.4 g/dL (ref 6.0–8.3)

## 2013-08-14 LAB — CBC
HEMATOCRIT: 39.5 % (ref 36.0–46.0)
Hemoglobin: 12.6 g/dL (ref 12.0–15.0)
MCH: 29.4 pg (ref 26.0–34.0)
MCHC: 31.9 g/dL (ref 30.0–36.0)
MCV: 92.1 fL (ref 78.0–100.0)
Platelets: 162 10*3/uL (ref 150–400)
RBC: 4.29 MIL/uL (ref 3.87–5.11)
RDW: 13.6 % (ref 11.5–15.5)
WBC: 3.7 10*3/uL — ABNORMAL LOW (ref 4.0–10.5)

## 2013-08-14 MED ORDER — ASPIRIN EC 81 MG PO TBEC
81.0000 mg | DELAYED_RELEASE_TABLET | Freq: Every day | ORAL | Status: DC
  Administered 2013-08-14 – 2013-08-16 (×3): 81 mg via ORAL
  Filled 2013-08-14 (×3): qty 1

## 2013-08-14 MED ORDER — LEVALBUTEROL HCL 0.63 MG/3ML IN NEBU
0.6300 mg | INHALATION_SOLUTION | Freq: Four times a day (QID) | RESPIRATORY_TRACT | Status: DC
  Administered 2013-08-14 – 2013-08-15 (×6): 0.63 mg via RESPIRATORY_TRACT
  Filled 2013-08-14 (×7): qty 3

## 2013-08-14 MED ORDER — SODIUM CHLORIDE 0.9 % IV SOLN
INTRAVENOUS | Status: DC
  Administered 2013-08-14: 01:00:00 via INTRAVENOUS

## 2013-08-14 MED ORDER — POTASSIUM CHLORIDE CRYS ER 20 MEQ PO TBCR
40.0000 meq | EXTENDED_RELEASE_TABLET | Freq: Two times a day (BID) | ORAL | Status: AC
Start: 2013-08-14 — End: 2013-08-15
  Administered 2013-08-14 – 2013-08-15 (×3): 40 meq via ORAL
  Filled 2013-08-14 (×3): qty 2

## 2013-08-14 MED ORDER — LEVOFLOXACIN IN D5W 750 MG/150ML IV SOLN
750.0000 mg | Freq: Once | INTRAVENOUS | Status: AC
  Administered 2013-08-14: 750 mg via INTRAVENOUS
  Filled 2013-08-14: qty 150

## 2013-08-14 MED ORDER — SIMVASTATIN 20 MG PO TABS
40.0000 mg | ORAL_TABLET | Freq: Every day | ORAL | Status: DC
  Administered 2013-08-14 – 2013-08-15 (×2): 40 mg via ORAL
  Filled 2013-08-14 (×2): qty 2

## 2013-08-14 MED ORDER — CARVEDILOL 3.125 MG PO TABS
3.1250 mg | ORAL_TABLET | Freq: Two times a day (BID) | ORAL | Status: DC
  Administered 2013-08-14 – 2013-08-16 (×5): 3.125 mg via ORAL
  Filled 2013-08-14 (×5): qty 1

## 2013-08-14 MED ORDER — ALPRAZOLAM 0.25 MG PO TABS: 0.2500 mg | ORAL_TABLET | Freq: Every evening | ORAL | Status: DC | PRN

## 2013-08-14 MED ORDER — NYSTATIN 100000 UNIT/GM EX POWD
Freq: Three times a day (TID) | CUTANEOUS | Status: DC
  Administered 2013-08-14 – 2013-08-16 (×6): via TOPICAL
  Filled 2013-08-14: qty 15

## 2013-08-14 MED ORDER — ACETAMINOPHEN 325 MG PO TABS: 650.0000 mg | ORAL_TABLET | Freq: Four times a day (QID) | ORAL | Status: DC | PRN

## 2013-08-14 MED ORDER — LEVOFLOXACIN IN D5W 750 MG/150ML IV SOLN
INTRAVENOUS | Status: AC
  Filled 2013-08-14: qty 150

## 2013-08-14 MED ORDER — PREDNISONE 20 MG PO TABS
60.0000 mg | ORAL_TABLET | Freq: Two times a day (BID) | ORAL | Status: DC
  Administered 2013-08-14 – 2013-08-15 (×3): 60 mg via ORAL
  Filled 2013-08-14 (×4): qty 3

## 2013-08-14 MED ORDER — APIXABAN 2.5 MG PO TABS
ORAL_TABLET | ORAL | Status: AC
  Filled 2013-08-14: qty 1

## 2013-08-14 MED ORDER — VANCOMYCIN HCL IN DEXTROSE 1-5 GM/200ML-% IV SOLN: 1000.0000 mg | INTRAVENOUS | Status: DC

## 2013-08-14 MED ORDER — LEVETIRACETAM 500 MG PO TABS
250.0000 mg | ORAL_TABLET | Freq: Two times a day (BID) | ORAL | Status: DC
  Administered 2013-08-14 – 2013-08-16 (×6): 250 mg via ORAL
  Filled 2013-08-14 (×6): qty 1

## 2013-08-14 MED ORDER — DOCUSATE SODIUM 100 MG PO CAPS
100.0000 mg | ORAL_CAPSULE | Freq: Two times a day (BID) | ORAL | Status: DC
  Administered 2013-08-14 – 2013-08-15 (×2): 100 mg via ORAL
  Filled 2013-08-14 (×3): qty 1

## 2013-08-14 MED ORDER — SODIUM CHLORIDE 0.9 % IJ SOLN
3.0000 mL | Freq: Two times a day (BID) | INTRAMUSCULAR | Status: DC
  Administered 2013-08-14 – 2013-08-16 (×4): 3 mL via INTRAVENOUS

## 2013-08-14 MED ORDER — ONDANSETRON HCL 4 MG/2ML IJ SOLN: 4.0000 mg | Freq: Four times a day (QID) | INTRAMUSCULAR | Status: DC | PRN

## 2013-08-14 MED ORDER — LEVALBUTEROL HCL 0.63 MG/3ML IN NEBU: 0.6300 mg | INHALATION_SOLUTION | Freq: Four times a day (QID) | RESPIRATORY_TRACT | Status: DC | PRN

## 2013-08-14 MED ORDER — FOLIC ACID 1 MG PO TABS
1.0000 mg | ORAL_TABLET | Freq: Every day | ORAL | Status: DC
  Administered 2013-08-14 – 2013-08-16 (×3): 1 mg via ORAL
  Filled 2013-08-14 (×3): qty 1

## 2013-08-14 MED ORDER — LEVOTHYROXINE SODIUM 100 MCG PO TABS
100.0000 ug | ORAL_TABLET | Freq: Every day | ORAL | Status: DC
  Administered 2013-08-14 – 2013-08-16 (×3): 100 ug via ORAL
  Filled 2013-08-14 (×3): qty 1

## 2013-08-14 MED ORDER — LEVOFLOXACIN IN D5W 750 MG/150ML IV SOLN
750.0000 mg | INTRAVENOUS | Status: DC
  Filled 2013-08-14: qty 150

## 2013-08-14 MED ORDER — GUAIFENESIN ER 600 MG PO TB12
600.0000 mg | ORAL_TABLET | Freq: Two times a day (BID) | ORAL | Status: DC
  Administered 2013-08-14 – 2013-08-16 (×6): 600 mg via ORAL
  Filled 2013-08-14 (×6): qty 1

## 2013-08-14 MED ORDER — TORSEMIDE 20 MG PO TABS
30.0000 mg | ORAL_TABLET | Freq: Every day | ORAL | Status: DC
  Administered 2013-08-14 – 2013-08-16 (×3): 30 mg via ORAL
  Filled 2013-08-14 (×3): qty 2

## 2013-08-14 MED ORDER — IPRATROPIUM BROMIDE 0.02 % IN SOLN
0.5000 mg | Freq: Four times a day (QID) | RESPIRATORY_TRACT | Status: DC
  Administered 2013-08-14 – 2013-08-15 (×6): 0.5 mg via RESPIRATORY_TRACT
  Filled 2013-08-14 (×7): qty 2.5

## 2013-08-14 MED ORDER — ONDANSETRON HCL 4 MG PO TABS: 4.0000 mg | ORAL_TABLET | Freq: Four times a day (QID) | ORAL | Status: DC | PRN

## 2013-08-14 MED ORDER — ACETAMINOPHEN 650 MG RE SUPP: 650.0000 mg | Freq: Four times a day (QID) | RECTAL | Status: DC | PRN

## 2013-08-14 MED ORDER — HYDROCODONE-ACETAMINOPHEN 5-325 MG PO TABS: 1.0000 | ORAL_TABLET | Freq: Every evening | ORAL | Status: DC | PRN

## 2013-08-14 MED ORDER — APIXABAN 2.5 MG PO TABS
2.5000 mg | ORAL_TABLET | Freq: Two times a day (BID) | ORAL | Status: DC
  Administered 2013-08-14 – 2013-08-16 (×5): 2.5 mg via ORAL
  Filled 2013-08-14 (×10): qty 1

## 2013-08-14 MED ORDER — POTASSIUM CHLORIDE CRYS ER 10 MEQ PO TBCR
10.0000 meq | EXTENDED_RELEASE_TABLET | Freq: Every day | ORAL | Status: DC
  Filled 2013-08-14 (×2): qty 1

## 2013-08-14 NOTE — Progress Notes (Signed)
[facility] PROGRESS NOTE  [name] WUJ:[id number] DOB: 08/18/1918 DOA: 08/13/2013 PCP: Milinda AntisURHAM, KAWANTA, MD  Assessment/Plan: 1. Acute bronchitis. Continue empiric treatment with antibiotics, steroids, nebulizers. No hypoxia. 2. Cellulitis right lower extremity. No complicating features. Continue empiric antibiotics. 3. UTI continue empiric treatment. Followup culture. 4. Acute renal failure, dehydration. Resolving with IV fluids. 5. Bilateral lower extremity edema: Chronic per patient. Echo 10/2012 with normal LV function, study could not comment on diastolic function. Appears to be improving. 6. Generalized weakness. Physical therapy consult. 7. Diarrhea. Stool studies. 8. Atrial fibrillation. Stable, continue Eliquis, Corge. 9. Diarrhea  10. H/o seizure disorder. Continue Keppra.   Continue antibiotics, steroids, nebulizers. Followup culture.  SL IV  Stool studies  Replace potassium  Strict I/O  Nystatin to skin  Pending studies:   Urine culture   Code Status: full code DVT prophylaxis: Eliquis Family Communication: Discussed with son Disposition Plan: Home  Sara Sacksaniel Janalynn Eder, MD  [facility]  Pager [phone] If 7PM-7AM, please contact night-coverage at [email or web address], password [email or web address] 08/14/2013, 9:02 AM  LOS: 1 day   Summary: 80 year old woman presented with cough, wheezing, redness of the right leg and edema. She was admitted for acute bronchitis, cellulitis right lower extremity, UTI.  Consultants:    Procedures:    Antibiotics:    HPI/Subjective: Feels somewhat better today. Still coughing. Breathing okay. Decreased edema bilateral lower extremities. Right lower extremity more tender than left. Skin under pannus is quite sore.  Objective: Filed Vitals:   08/13/13 2222 08/14/13 0035 08/14/13 0328 08/14/13 0349  BP: 122/62 119/69  99/69  Pulse: 53 78  57  Temp: 98.4 F (36.9 C) 98.6 F (37 C)  97.5 F (36.4 C)  TempSrc: Oral Oral   Oral  Resp: 20 20  20   Height:  5\' 8"  (1.727 m)    Weight:  93.169 kg (205 lb 6.4 oz)    SpO2: 97% 96% 98% 99%   No intake or output data in the 24 hours ending 08/14/13 0902   Filed Weights   08/13/13 1712 08/14/13 0035  Weight: 86.183 kg (190 lb) 93.169 kg (205 lb 6.4 oz)    Exam:   Afebrile, vital signs stable no hypoxia  General: Appears calm, comfortable, nontoxic.  Cardiovascular: Regular rate and rhythm. No murmur, rub or gallop. 1+ left lower extremity edema, 2+ right lower extremity edema.  Skin: Right lower extremity with cellulitis from below the knee to above the ankle. No fluctuance. No evidence of complicating features. Right foot is warm and dry with normal perfusion. The pannus has confluent erythema consistent with Candida.  Respiratory: Clear to auscultation bilaterally. No wheezes, rales or rhonchi. Normal respiratory effort.  Abdomen: Soft, nontender, nondistended.  Psychiatric: Grossly normal mood and affect. Speech fluent and appropriate.  Data Reviewed:  BUN, creatinine have improved, potassium 3.4, basic metabolic panel otherwise unremarkable. Hepatic function panel appears normal.  WBC 3.7, CBC otherwise unremarkable  Grossly positive urinalysis, culture pending  Scheduled Meds: . apixaban  2.5 mg Oral BID  . aspirin EC  81 mg Oral Daily  . carvedilol  3.125 mg Oral BID  . docusate sodium  100 mg Oral BID  . folic acid  1 mg Oral Daily  . guaiFENesin  600 mg Oral BID  . ipratropium  0.5 mg Nebulization Q6H  . levalbuterol  0.63 mg Nebulization Q6H  . levETIRAcetam  250 mg Oral BID  . [START ON 08/16/2013] levofloxacin (LEVAQUIN) IV  750 mg Intravenous Q48H  . levothyroxine  100 mcg Oral QAC breakfast  . potassium chloride  10 mEq Oral Daily  . predniSONE  60 mg Oral BID WC  . simvastatin  40 mg Oral QHS  . sodium chloride  3 mL Intravenous Q12H  . torsemide  30 mg Oral Daily  . [START ON 08/15/2013] vancomycin  1,000 mg Intravenous Q48H    Continuous Infusions: . sodium chloride 75 mL/hr at 08/14/13 0030    Principal Problem:   Acute bronchitis Active Problems:   HYPOTHYROIDISM   TOBACCO ABUSE   CORONARY ARTERY DISEASE   COPD   SEIZURE DISORDER   Atrial fibrillation   Acute diarrhea   Cellulitis of lower leg   UTI (lower urinary tract infection)   Time spent 20 minutes

## 2013-08-14 NOTE — Evaluation (Signed)
Physical Therapy Evaluation Patient Details Name: [name] MRN: [id number] DOB: [birth year] Today's Date: 08/14/2013 Time: 1130-1210 PT Time Calculation (min): 40 min  PT Assessment / Plan / Recommendation History of Present Illness  Pt is a 80 year old female admited to [facility] for acute bronchitis. Reports she is feeling better today compared to yesterday.    Clinical Impression  Pt is referred to PT for mobility with impairments listed below.  At this time pt requires mod A for most mobility and is significant limited by her overall fatigue.  Unable to use RUE due to previous fracture and uses her LUE for pericare w/max A.  Would greatly benefit from SNF services to improve overall activity tolerance and strength to improve independence.     PT Assessment  Patient needs continued PT services    Follow Up Recommendations  SNF    Does the patient have the potential to tolerate intense rehabilitation      Barriers to Discharge Decreased caregiver support      Equipment Recommendations  None recommended by PT    Recommendations for Other Services     Frequency Min 3X/week    Precautions / Restrictions Precautions Precaution Comments: Contact   Pertinent Vitals/Pain Denies pain       Mobility  Bed Mobility Bed Mobility: Left Sidelying to Sit Left Sidelying to Sit: 3: Mod assist;HOB elevated;With rails Transfers Transfers: Sit to Stand;Stand to Sit Sit to Stand: 3: Mod assist;From bed;From chair/3-in-1 Stand to Sit: 3: Mod assist;To chair/3-in-1;To bed Details for Transfer Assistance: completed" bed > 3/1>chair.  trunk flexed with standing and does not use RUE (due to previous should fractures).  Used RW from bed to 3/1.  Ambulation/Gait Ambulation/Gait Assistance: Not tested (comment)    Exercises     PT Diagnosis: Difficulty walking;Abnormality of gait;Generalized weakness  PT Problem List: Decreased strength;Decreased range of motion;Decreased activity  tolerance;Decreased mobility PT Treatment Interventions: Gait training;Stair training;Functional mobility training;Therapeutic activities;Therapeutic exercise;Neuromuscular re-education     PT Goals(Current goals can be found in the care plan section) Acute Rehab PT Goals Patient Stated Goal: "I would like to get a care attendent again." She does not want to go to assisted living if it means she has to do most of her own work.  Is willing to participate in SNF to decrease burden of care and improve independence.  Time For Goal Achievement: 08/21/13 Potential to Achieve Goals: Good  Visit Information  Last PT Received On: 08/14/13 History of Present Illness: Pt is a 81 year old female admited to [facility] for acute bronchitis.        Prior Functioning  Home Living Family/patient expects to be discharged to:: Private residence Living Arrangements: Alone Available Help at Discharge: Family Type of Home: House Home Access: Ramped entrance Home Layout: One level Home Equipment: Bedside commode;Electric scooter;Tub bench;Walker - 2 wheels Additional Comments: used to have a care attendent, no longer has one.  Her daugther who used to care for her is now in chemo and requires a feeding tube.  Prior Function Level of Independence: Needs assistance Gait / Transfers Assistance Needed: Scooter for community mobility, walks with a RW at home.  Communication Communication: No difficulties Dominant Hand: Right    Cognition  Cognition Arousal/Alertness: Awake/alert Behavior During Therapy: WFL for tasks assessed/performed Overall Cognitive Status: Within Functional Limits for tasks assessed    Extremity/Trunk Assessment Lower Extremity Assessment Lower Extremity Assessment: Generalized weakness Cervical / Trunk Assessment Cervical / Trunk Assessment: Kyphotic  Balance    End of Session PT - End of Session Equipment Utilized During Treatment: Gait belt Activity Tolerance: Patient limited by  fatigue Patient left: in chair Nurse Communication: Mobility status  GP     Tyshia Fenter, MPT, ATC 08/14/2013, 12:19 PM

## 2013-08-14 NOTE — Progress Notes (Signed)
ANTIBIOTIC CONSULT NOTE-follow up  Pharmacy Consult for Levofloxacin and Vancomycin Indication: Bronchitis/cellulitis   No Known Allergies  Patient Measurements: Height: 5\' 8"  (172.7 cm) Weight: 205 lb 6.4 oz (93.169 kg) IBW/kg (Calculated) : 63.9  Vital Signs: Temp: 97.5 F (36.4 C) (01/01 0349) Temp src: Oral (01/01 0349) BP: 99/69 mmHg (01/01 0349) Pulse Rate: 57 (01/01 0349)  Labs:  Recent Labs  08/13/13 1735 08/14/13 0609  WBC 4.4 3.7*  HGB 13.8 12.6  PLT 186 162  CREATININE 1.45* 1.27*    Estimated Creatinine Clearance: 33.7 ml/min (by C-G formula based on Cr of 1.27).  No results found for this basename: VANCOTROUGH, VANCOPEAK, VANCORANDOM, GENTTROUGH, GENTPEAK, GENTRANDOM, TOBRATROUGH, TOBRAPEAK, TOBRARND, AMIKACINPEAK, AMIKACINTROU, AMIKACIN,  in the last 72 hours   Microbiology: No results found for this or any previous visit (from the past 720 hour(s)).  Medical History: Past Medical History  Diagnosis Date  . Myocardial infarction     history of  . Coronary artery disease   . Leg edema     left  . Aneurysm     hx of  . COPD (chronic obstructive pulmonary disease)   . Hypertension   . Pulmonary nodule   . Obesity   . Anxiety   . Depression   . Hemoptysis   . Pressure ulcer of back     lower  . Tobacco abuse   . DJD (degenerative joint disease)   . Panic disorder   . Urinary incontinence   . Seizure disorder   . Ganglion cyst     left wrist  . Hypothyroidism   . Leg edema   . DDD (degenerative disc disease), lumbosacral   . Seizures     Medications:  Vancomycin 1 Gm IV in the ED Ceftriaxone 1 Gm IV in the ED  Assessment: 80 yo female with productive cough, wheezing, weakness, chills, and diarrhea x 2-3 days. She also has cellulitis of right lower extremity and UTI. Empiric antibiotic coverage with Levaquin and Vancomycin.  Estimated Creatinine Clearance: 33.7 ml/min (by C-G formula based on Cr of 1.27).  Rocephin 12/31 x  1 Levaquin 1/1 >> Vancomycin 12/31 >>  Goal of Therapy:  Vancomycin troughs 10-20 mcg/ml Eradication of infection  Plan:  Preliminary review of pertinent patient information completed Levaquin 750mg  IV q48hrs Switch to PO when clinically indicated Vancomycin 1000mg  IV q48hrs Check trough at steady state Monitor labs, renal fxn, and cultures  Valrie HartHall, Ishmel Acevedo A, RPH 08/14/2013,7:52 AM

## 2013-08-14 NOTE — Progress Notes (Signed)
Utilization Review Complete  

## 2013-08-14 NOTE — Progress Notes (Signed)
ANTIBIOTIC CONSULT NOTE-Preliminary  Pharmacy Consult for Levofloxacin Indication: Bronchitis/cellulitis   No Known Allergies  Patient Measurements: Height: 5\' 8"  (172.7 cm) Weight: 205 lb 6.4 oz (93.169 kg) IBW/kg (Calculated) : 63.9  Vital Signs: Temp: 98.6 F (37 C) (01/01 0035) Temp src: Oral (01/01 0035) BP: 119/69 mmHg (01/01 0035) Pulse Rate: 78 (01/01 0035)  Labs:  Recent Labs  08/13/13 1735  WBC 4.4  HGB 13.8  PLT 186  CREATININE 1.45*    Estimated Creatinine Clearance: 29.5 ml/min (by C-G formula based on Cr of 1.45).  No results found for this basename: VANCOTROUGH, VANCOPEAK, VANCORANDOM, GENTTROUGH, GENTPEAK, GENTRANDOM, TOBRATROUGH, TOBRAPEAK, TOBRARND, AMIKACINPEAK, AMIKACINTROU, AMIKACIN,  in the last 72 hours   Microbiology: No results found for this or any previous visit (from the past 720 hour(s)).  Medical History: Past Medical History  Diagnosis Date  . Myocardial infarction     history of  . Coronary artery disease   . Leg edema     left  . Aneurysm     hx of  . COPD (chronic obstructive pulmonary disease)   . Hypertension   . Pulmonary nodule   . Obesity   . Anxiety   . Depression   . Hemoptysis   . Pressure ulcer of back     lower  . Tobacco abuse   . DJD (degenerative joint disease)   . Panic disorder   . Urinary incontinence   . Seizure disorder   . Ganglion cyst     left wrist  . Hypothyroidism   . Leg edema   . DDD (degenerative disc disease), lumbosacral   . Seizures     Medications:  Vancomycin 1 Gm IV in the ED Ceftriaxone 1 Gm IV in the ED  Assessment: 80 yo female with productive cough, wheezing, weakness, chills, and diarrhea x 2-3 days. She also has cellulitis of right lower extremity and UTI. Empiric antibiotic coverage with Levaquin and Vancomycin.   Goal of Therapy:  Vancomycin troughs 10-15 mcg/ml Eradication of infection  Plan:  Preliminary review of pertinent patient information completed.   Protocol will be initiated with a one-time dose of levofloxacin 750 mg IV.  Jeani HawkingAnnie Penn clinical pharmacist will complete review during morning rounds to assess patient and finalize treatment regimen.  Arelia SneddonMason, Everlene Cunning Anne, [facility] 08/14/2013,1:20 AM

## 2013-08-15 DIAGNOSIS — R5383 Other fatigue: Secondary | ICD-10-CM

## 2013-08-15 DIAGNOSIS — R5381 Other malaise: Secondary | ICD-10-CM

## 2013-08-15 LAB — URINE CULTURE: Colony Count: >=100000

## 2013-08-15 LAB — BASIC METABOLIC PANEL
BUN: 27 mg/dL — ABNORMAL HIGH (ref 6–23)
CALCIUM: 9.4 mg/dL (ref 8.4–10.5)
CO2: 25 meq/L (ref 19–32)
CREATININE: 1.01 mg/dL (ref 0.50–1.10)
Chloride: 100 mEq/L (ref 96–112)
GFR calc Af Amer: 54 mL/min — ABNORMAL LOW (ref >90)
GFR, EST NON AFRICAN AMERICAN: 47 mL/min — AB (ref >90)
GLUCOSE: 151 mg/dL — AB (ref 70–99)
Potassium: 4.2 mEq/L (ref 3.7–5.3)
SODIUM: 138 meq/L (ref 137–147)

## 2013-08-15 MED ORDER — LEVALBUTEROL HCL 0.63 MG/3ML IN NEBU
0.6300 mg | INHALATION_SOLUTION | Freq: Three times a day (TID) | RESPIRATORY_TRACT | Status: DC
  Administered 2013-08-16: 0.63 mg via RESPIRATORY_TRACT
  Filled 2013-08-15 (×2): qty 3

## 2013-08-15 MED ORDER — PREDNISONE 20 MG PO TABS
40.0000 mg | ORAL_TABLET | Freq: Every day | ORAL | Status: DC
  Administered 2013-08-16: 40 mg via ORAL
  Filled 2013-08-15: qty 2

## 2013-08-15 MED ORDER — VANCOMYCIN HCL IN DEXTROSE 1-5 GM/200ML-% IV SOLN
INTRAVENOUS | Status: AC
  Filled 2013-08-15: qty 200

## 2013-08-15 MED ORDER — VANCOMYCIN HCL IN DEXTROSE 1-5 GM/200ML-% IV SOLN
1000.0000 mg | INTRAVENOUS | Status: DC
  Filled 2013-08-15 (×3): qty 200

## 2013-08-15 MED ORDER — IPRATROPIUM BROMIDE 0.02 % IN SOLN
0.5000 mg | Freq: Three times a day (TID) | RESPIRATORY_TRACT | Status: DC
  Administered 2013-08-16: 0.5 mg via RESPIRATORY_TRACT
  Filled 2013-08-15 (×2): qty 2.5

## 2013-08-15 NOTE — Clinical Social Work Placement (Signed)
    Clinical Social Work Department CLINICAL SOCIAL WORK PLACEMENT NOTE 08/15/2013  Patient:  [name],[name]  Account Number:  [id number] Admit date:  08/13/2013  Clinical Social Worker:  Santa GeneraANNE Abednego Yeates, CLINICAL SOCIAL WORKER  Date/time:  08/15/2013 10:00 AM  Clinical Social Work is seeking post-discharge placement for this patient at the following level of care:   SKILLED NURSING   (*CSW will update this form in Epic as items are completed)   08/15/2013  Patient/family provided with [facility] of Clinical Social Work's list of facilities offering this level of care within the geographic area requested by the patient (or if unable, by the patient's family).  08/15/2013  Patient/family informed of their freedom to choose among providers that offer the needed level of care, that participate in Medicare, Medicaid or managed care program needed by the patient, have an available bed and are willing to accept the patient.  08/15/2013  Patient/family informed of [facility]' ownership interest in [facility], as well as of the fact that they are under no obligation to receive care at this facility.  PASARR submitted to EDS on 08/15/2013 PASARR number received from EDS on 08/15/2013  FL2 transmitted to all facilities in geographic area requested by pt/family on  08/15/2013 FL2 transmitted to all facilities within larger geographic area on   Patient informed that his/her managed care company has contracts with or will negotiate with  certain facilities, including the following:     Patient/family informed of bed offers received:  08/15/2013 [name] Physician recommends and patient chooses bed at    Patient to be transferred to  on   Patient to be transferred to facility by   The following physician request were entered in Epic:   Additional Comments: [employer] [facility] auth [name] 7 day auth  Santa GeneraAnne  Bernie Fobes, LCSW Clinical Social Worker [phone])

## 2013-08-15 NOTE — Progress Notes (Signed)
[facility] PROGRESS NOTE  NERIDA BOIVIN JXB:[id number] DOB: March 25, 1919 DOA: 08/13/2013 PCP: Milinda Antis, MD  Assessment/Plan: 1. Acute bronchitis. Continue empiric treatment with antibiotics, steroids, nebulizers. No hypoxia. Much improved. 2. Cellulitis right lower extremity. No complicating features. Continue empiric antibiotics. Continues to improve 3. UTI continue empiric treatment. Followup culture. 4. Acute renal failure, dehydration. Resolved with IV fluids. 5. Bilateral lower extremity edema: Chronic per patient. Echo 10/2012 with normal LV function, study could not comment on diastolic function. Appears to be improving. No LE DVT 6. Generalized weakness. Physical therapy consultnoted, SNF recommended. 7. Diarrhea. Stool studies if can obtain 8. Atrial fibrillation. Stable, continue Eliquis, Coreg. 9. Diarrhea  10. H/o seizure disorder. Continue Keppra.   Wean steroids  Change abx to PO 1/3. Narrow to Levaquin  Anticipate transfer to SNF 1/3.  Pending studies:   Urine culture   Code Status: full code DVT prophylaxis: Eliquis Family Communication: Discussed with family Disposition Plan: Home  Brendia Sacks, MD  [facility]  Pager [phone] If 7PM-7AM, please contact night-coverage at [email or web address], password [facility] 08/15/2013, 5:54 PM  LOS: 2 days   Summary: 80 year old woman presented with cough, wheezing, redness of the right leg and edema. She was admitted for acute bronchitis, cellulitis right lower extremity, UTI.  Consultants:    Procedures:    Antibiotics:  Vancomycin 12/31 >>  Levaquin 1/1 >>    HPI/Subjective: Feeling better today. Breathing better. Right leg feels better, much less tender. No new issues.  Objective: Filed Vitals:   08/14/13 2154 08/15/13 0517 08/15/13 1055 08/15/13 1546  BP:  125/78  120/65  Pulse:  112  78  Temp:  97.3 F (36.3 C)  97.5 F (36.4 C)  TempSrc:  Oral    Resp:  20  18  Height:       Weight:      SpO2: 95% 97% 100% 98%    Intake/Output Summary (Last 24 hours) at 08/15/13 1754 Last data filed at 08/14/13 1930  Gross per 24 hour  Intake    440 ml  Output    175 ml  Net    265 ml     Filed Weights   08/13/13 1712 08/14/13 0035  Weight: 86.183 kg (190 lb) 93.169 kg (205 lb 6.4 oz)    Exam:   Afebrile, vital signs stable no hypoxia  General: Appears comfortable, calm.  Cardiovascular: Regular rate and rhythm. No murmur, rub or gallop.  Respiratory: Clear to auscultation bilaterally. No wheezes, rales or rhonchi. Normal respiratory effort.  Abdomen: Candidate is improving.  Right lower extremity decreased edema, decreased erythema. No tenderness. No fluctuance.  Data Reviewed:  Basic metabolic panel unremarkable. Creatinine normal.  Potassium normal  No DVT bilateral lower extremities  Scheduled Meds: . apixaban  2.5 mg Oral BID  . aspirin EC  81 mg Oral Daily  . carvedilol  3.125 mg Oral BID  . docusate sodium  100 mg Oral BID  . folic acid  1 mg Oral Daily  . guaiFENesin  600 mg Oral BID  . ipratropium  0.5 mg Nebulization Q6H  . levalbuterol  0.63 mg Nebulization Q6H  . levETIRAcetam  250 mg Oral BID  . [START ON 08/16/2013] levofloxacin (LEVAQUIN) IV  750 mg Intravenous Q48H  . levothyroxine  100 mcg Oral QAC breakfast  . nystatin   Topical TID  . predniSONE  60 mg Oral BID WC  . simvastatin  40 mg Oral QHS  . sodium chloride  3 mL Intravenous  Q12H  . torsemide  30 mg Oral Daily  . vancomycin  1,000 mg Intravenous Q24H   Continuous Infusions:    Principal Problem:   Acute bronchitis Active Problems:   HYPOTHYROIDISM   TOBACCO ABUSE   CORONARY ARTERY DISEASE   COPD   SEIZURE DISORDER   Atrial fibrillation   Acute diarrhea   Cellulitis of lower leg   UTI (lower urinary tract infection)   Time spent 20 minutes

## 2013-08-15 NOTE — Evaluation (Signed)
Occupational Therapy Evaluation Patient Details Name: Sara LenzRosa B [name] MRN: [id number] DOB: 09/29/1918 Today's Date: 08/14/2013 Time: 1515-1600 OT Time Calculation (min): 45 min  OT Assessment / Plan / Recommendation History of present illness Pt is a 80 year old [facility].  Now referred for OT eval and treat   Clinical Impression   Patient presents with decreased functional independence with ADL and mobility tasks.  Recommend patient would benefit from skilled OT services to maximize functional independence/potential.  REcommend SNF place and discussed withpatient option of ALF due to decreased support at home.     OT Assessment  Patient needs continued OT Services    Follow Up Recommendations  SNF    Barriers to Discharge   patient lives alone  Equipment Recommendations       Recommendations for Other Services    Frequency       Precautions / Restrictions Precautions Precaution Comments: Contact       ADL  Eating/Feeding: Modified independent Grooming: Set up;Min guard Where Assessed - Grooming: Unsupported sitting Upper Body Dressing: Set up Where Assessed - Upper Body Dressing: Unsupported sitting Lower Body Dressing: Minimal assistance Toilet Transfer: Minimal assistance;Moderate assistance Toilet Transfer Method: Stand pivot Toilet Transfer Equipment: Materials engineerBedside commode Toileting - Clothing Manipulation and Hygiene: Min guard    OT Diagnosis: Generalized weakness  OT Problem List: Decreased strength;Decreased range of motion;Decreased activity tolerance;Impaired UE functional use OT Treatment Interventions: Self-care/ADL training;Therapeutic exercise;Therapeutic activities;Energy conservation;DME and/or AE instruction;Patient/family education;Balance training;Modalities   OT Goals(Current goals can be found in the care plan section) Acute Rehab OT Goals Patient Stated Goal: patient states she would like to be back in her home and that  she would like to have personal care aide again to be safe and complete tasks  OT Goal Formulation: With patient Time For Goal Achievement: 08/29/13 Potential to Achieve Goals: Good ADL Goals Pt Will Perform Lower Body Dressing: with min assist;with adaptive equipment;sit to/from stand Pt Will Transfer to Toilet: with min guard assist;grab bars;stand pivot transfer Pt Will Perform Toileting - Clothing Manipulation and hygiene: with supervision;sit to/from stand Pt/caregiver will Perform Home Exercise Program: Both right and left upper extremity;With Supervision  Visit Information  History of Present Illness: Pt is a 80 [facility]  Now referred for OT eval and treat       Prior Functioning     Home Living Family/patient expects to be discharged to:: Private residence Living Arrangements: Alone Available Help at Discharge: Family Type of Home: House Home Access: Ramped entrance Home Layout: One level Home Equipment: Bedside commode;Electric scooter;Tub bench;Walker - 2 wheels Additional Comments: used to have a care attendent, no longer has one.  Her daugther who used to care for her is now in chemo and requires a feeding tube.   Gets meals on wheels Prior Function Level of Independence: Needs assistance Gait / Transfers Assistance Needed: Scooter for community mobility, walks with a RW at home.  ADL's / Homemaking Assistance Needed: Patient states she struggles through her ADL tasks and now sponge bathes due to fear of getting ini shower;  daughter assists her with  IADL tasks however is struggling with medical issues herself  Communication Communication: No difficulties Dominant Hand: Right         Vision/Perception Vision - History Patient Visual Report: No change from baseline Vision - Assessment Eye Alignment: Within Functional Limits   Cognition  Cognition Arousal/Alertness: Awake/alert Behavior During Therapy:  WFL for  tasks assessed/performed Overall Cognitive Status: Within Functional Limits for tasks assessed    Extremity/Trunk Assessment Upper Extremity Assessment Upper Extremity Assessment: Generalized weakness;RUE deficits/detail;Overall [facility] for tasks assessed (patient with complaint of numbness in bilateral hands distal fingertips for some time ) RUE Deficits / Details: patient with significantly limited proximal range in RUE shoulder however patient has learned to compensate to be functionally independent with her ADL tasks with increased time.  Lower Extremity Assessment Lower Extremity Assessment: Defer to PT evaluation Cervical / Trunk Assessment Cervical / Trunk Assessment: Kyphotic     Mobility Bed Mobility Bed Mobility: Rolling Right Rolling Right: 4: Min assist;With rail (significant increased timel) Transfers Transfers: Sit to Stand;Stand to Sit Sit to Stand: 3: Mod assist;4: Min assist Stand to Sit: 4: Min assist;3: Mod assist     Exercise     Balance     End of Session OT - End of Session Activity Tolerance: Patient tolerated treatment well Patient left: in bed;with call bell/phone within reach  GO     Velora Mediate, OTR/L 08/14/2013, 4:49 PM

## 2013-08-15 NOTE — Progress Notes (Signed)
ANTIBIOTIC CONSULT NOTE-follow up  Pharmacy Consult for Levofloxacin and Vancomycin Indication: Bronchitis/cellulitis   No Known Allergies  Patient Measurements: Height: 5\' 8"  (172.7 cm) Weight: 205 lb 6.4 oz (93.169 kg) IBW/kg (Calculated) : 63.9  Vital Signs: Temp: 97.3 F (36.3 C) (01/02 0517) Temp src: Oral (01/02 0517) BP: 125/78 mmHg (01/02 0517) Pulse Rate: 112 (01/02 0517)  Labs:  Recent Labs  08/13/13 1735 08/14/13 0609 08/15/13 0511  WBC 4.4 3.7*  --   HGB 13.8 12.6  --   PLT 186 162  --   CREATININE 1.45* 1.27* 1.01   Estimated Creatinine Clearance: 42.4 ml/min (by C-G formula based on Cr of 1.01).  No results found for this basename: VANCOTROUGH, VANCOPEAK, VANCORANDOM, GENTTROUGH, GENTPEAK, GENTRANDOM, TOBRATROUGH, TOBRAPEAK, TOBRARND, AMIKACINPEAK, AMIKACINTROU, AMIKACIN,  in the last 72 hours   Microbiology: No results found for this or any previous visit (from the past 720 hour(s)).  Medical History: Past Medical History  Diagnosis Date  . Myocardial infarction     history of  . Coronary artery disease   . Leg edema     left  . Aneurysm     hx of  . COPD (chronic obstructive pulmonary disease)   . Hypertension   . Pulmonary nodule   . Obesity   . Anxiety   . Depression   . Hemoptysis   . Pressure ulcer of back     lower  . Tobacco abuse   . DJD (degenerative joint disease)   . Panic disorder   . Urinary incontinence   . Seizure disorder   . Ganglion cyst     left wrist  . Hypothyroidism   . Leg edema   . DDD (degenerative disc disease), lumbosacral   . Seizures    Assessment: 80 yo female with productive cough, wheezing, weakness, chills, and diarrhea x 2-3 days. She also has cellulitis of right lower extremity and UTI. Empiric antibiotic coverage with Levaquin and Vancomycin.  SCr has improved since admission.   Estimated Creatinine Clearance: 42.4 ml/min (by C-G formula based on Cr of 1.01).  Rocephin 12/31 x 1 Levaquin 1/1  >> Vancomycin 12/31 >>  Goal of Therapy:  Vancomycin troughs 10-20 mcg/ml Eradication of infection  Plan:  Levaquin 750mg  IV q48hrs Switch to PO when clinically indicated Empirically increase Vancomycin 1000mg  IV q24hrs Check trough at steady state Monitor labs, renal fxn, and cultures  Wayland DenisHall, Hermenegildo Clausen A, RPH 08/15/2013,11:06 AM

## 2013-08-15 NOTE — Clinical Social Work Psychosocial (Signed)
Clinical Social Work Department BRIEF PSYCHOSOCIAL ASSESSMENT 08/15/2013  Patient:  [name], [name]     Account Number:  [id number]     Admit date:  08/13/2013  Clinical Social Worker:  Edwyna Shell, CLINICAL SOCIAL WORKER  Date/Time:  08/15/2013 10:00 AM  Referred by:  Physician  Date Referred:  08/15/2013 Referred for  SNF Placement   Other Referral:   Interview type:  Patient Other interview type:    PSYCHOSOCIAL DATA Living Status:  ALONE Admitted from facility:   Level of care:   Primary support name:  [place] Primary support relationship to patient:  CHILD, ADULT Degree of support available:   Patient lives alone and makes all her own decisions, is closest to daughter in [place], however daughter has cancer and is unable to assist w patient's physical  needs    CURRENT CONCERNS Current Concerns  Post-Acute Placement   Other Concerns:    SOCIAL WORK ASSESSMENT / PLAN CSW met w patient at bedside, patient alert and oriented x4. Pleasant, independent 80 year old who lives alone.  Has children in [place] and [place] - is closest to daughter in [place].  Daughter has cancer, per patient is on "bag feeding" at night, is medically fragile and therefore not able to assist w mother's care needs.  Patient admits to feeling much weaker than she used to prior to admission, willing to consider SNF.  Expresses confidence in God's help, strong religious faith, recieve much comfort and support from her faith.    Agreeable to SNF bed search, although prefers to return home if possible.  Informed patient that her insurance may not authorize placement in enough time prior to discharge. If not authorized, patient would need to return home at discharge.  Patient aware also that copays may be required, SNF will advise on these.  Patient did not want CSW to contact any of her children, stating "I am 80 years old and I make all my own decisions.  [facility] in [place] if possible.     CSW called [facility] admissions, per Levada Dy she will start process of getting auth, however it will take 48 hours and will not likely be processed until early next week.  CSW also left message for Althea Grimmer rep, asking for assistance in expediting authorization process.   Assessment/plan status:  Psychosocial Support/Ongoing Assessment of Needs Other assessment/ plan:   Information/referral to community resources:   SNF list    PATIENT'S/FAMILY'S RESPONSE TO PLAN OF CARE: Prefers [facility] in [place], agreeable to short term rehab placement.        Edwyna Shell, LCSW Clinical Social Worker [phone])

## 2013-08-15 NOTE — Clinical Social Work Note (Signed)
Patient given bed offers - [facility] [name] 7 day stay w possible renewal.  Public relations account executiveacility administrator aware and agreeable.  Family in room informed, asked to contact facility to complete preadmission paperwork.  Family informed that they will need to provide transportation to SNF as patient may not qualify for EMS transport.  Santa GeneraAnne Rafael Salway, LCSW Clinical Social Worker [phone])

## 2013-08-15 NOTE — Progress Notes (Signed)
Physical Therapy Treatment Patient Details Name: [name] MRN: [id number] DOB: 12/24/1918 Today's Date: 08/15/2013 Time: [id number] PT Time Calculation (min): 43 min  PT Assessment / Plan / Recommendation  History of Present Illness Pt is a 80 year old female admited to [facility] for acute bronchitis.  Now referred for OT eval and treat   PT Comments   Pt cooperative and eager to complete therapy today.  Pt sitting in restroom initially at PTA entrance, pt able to complete sit to stands with min assistance and demonstated safe mechanics.  Gait training with min guard x 18 feet with rolling walker and min cueing required to increase stride length and for posture.  Pt left in chair with call bell within reach.  Follow Up Recommendations        Does the patient have the potential to tolerate intense rehabilitation     Barriers to Discharge        Equipment Recommendations       Recommendations for Other Services    Frequency     Progress towards PT Goals Progress towards PT goals: Progressing toward goals  Plan      Precautions / Restrictions Precautions Precaution Comments: Contact    Mobility  Bed Mobility Bed Mobility: Rolling Right Rolling Right: 4: Min assist;With rail (significant increased timel) Transfers Transfers: Sit to Stand;Stand to Sit Sit to Stand: 4: Min assist;With upper extremity assist;From toilet Stand to Sit: 4: Min assist;With upper extremity assist;To chair/3-in-1 Ambulation/Gait Ambulation/Gait Assistance: 4: Min guard Ambulation Distance (Feet): 18 Feet Assistive device: Rolling walker Gait Pattern: Decreased stride length Gait velocity: slow and labored General Gait Details: decreased stride length    Exercises Total Joint Exercises Ankle Circles/Pumps: AROM;Both;10 reps Hip ABduction/ADduction: AROM;Both;10 reps;Seated Long Arc Quad: AROM;Both;10 reps;Seated General Exercises - Lower Extremity Hip Flexion/Marching: AROM;Both;Seated;10 reps    PT Diagnosis:    PT Problem List:   PT Treatment Interventions:     PT Goals (current goals can now be found in the care plan section) Acute Rehab PT Goals Patient Stated Goal: patient states she would like to be back in her home and that she would like to have personal care aide again to be safe and complete tasks   Visit Information  Last PT Received On: 08/15/13 History of Present Illness: Pt is a 80 year old female [facility].  Now referred for OT eval and treat    Subjective Data  Patient Stated Goal: patient states she would like to be back in her home and that she would like to have personal care aide again to be safe and complete tasks    Cognition  Cognition Arousal/Alertness: Awake/alert Behavior During Therapy: WFL for tasks assessed/performed Overall Cognitive Status: Within Functional Limits for tasks assessed    Balance     End of Session PT - End of Session Equipment Utilized During Treatment: Gait belt Activity Tolerance: Patient limited by fatigue Patient left: in chair;with call bell/phone within reach Nurse Communication: Mobility status   GP     Juel BurrowCockerham, Simren Popson Jo 08/15/2013, 10:49 AM

## 2013-08-16 DIAGNOSIS — B372 Candidiasis of skin and nail: Secondary | ICD-10-CM

## 2013-08-16 MED ORDER — LEVOFLOXACIN 750 MG PO TABS: 750.0000 mg | ORAL_TABLET | ORAL | Status: DC

## 2013-08-16 MED ORDER — NYSTATIN 100000 UNIT/GM EX POWD
CUTANEOUS | Status: DC
Start: 2013-08-16 — End: 2013-10-31

## 2013-08-16 MED ORDER — PREDNISONE 20 MG PO TABS: ORAL_TABLET | ORAL | Status: DC

## 2013-08-16 MED ORDER — IPRATROPIUM BROMIDE 0.02 % IN SOLN: 0.5000 mg | Freq: Three times a day (TID) | RESPIRATORY_TRACT | Status: DC

## 2013-08-16 MED ORDER — LEVALBUTEROL HCL 0.63 MG/3ML IN NEBU: 0.6300 mg | INHALATION_SOLUTION | Freq: Three times a day (TID) | RESPIRATORY_TRACT | Status: DC

## 2013-08-16 NOTE — Progress Notes (Signed)
[facility] PROGRESS NOTE  Sara LenzRosa B [name] [id number] DOB: 11/25/1918 DOA: 08/13/2013 PCP: Milinda AntisURHAM, KAWANTA, MD  Assessment/Plan: 1. Acute bronchitis. Appears clinically resolved. 2. Cellulitis right lower extremity. Appears clinically resolved. Complete antibiotics. 3. UTI continue empiric treatment. Urine culture unrevealing. 4. Acute renal failure, dehydration. Resolved with IV fluids. 5. Bilateral lower extremity edema: Chronic per patient. Echo 10/2012 with normal LV function, study could not comment on diastolic function. Resolved. No LE DVT 6. Generalized weakness. Physical therapy consult noted, SNF. 7. Diarrhea. Stool studies could not be obtained 8. Atrial fibrillation. Stable, continue Eliquis, Coreg. 9. H/o seizure disorder. Continue Keppra.   Complete antibiotics. Steroid taper.   Transfer to skilled nursing facility today  Sara Sacksaniel Zamauri Nez, MD  [facility]  Pager [phone] If 7PM-7AM, please contact night-coverage at [email or web address], password [email or web address] 08/16/2013, 10:50 AM  LOS: 3 days   Summary: 80 year old woman presented with cough, wheezing, redness of the right leg and edema. She was admitted for acute bronchitis, cellulitis right lower extremity, UTI.  Consultants:    Procedures:    Antibiotics:  Vancomycin 12/31 >> 1/2  Levaquin 1/1 >> 1/8 (next dose 1/3 PM)  HPI/Subjective: Feels better. Breathing better. Right leg much improved.  Objective: Filed Vitals:   08/15/13 1957 08/15/13 2246 08/16/13 0553 08/16/13 0924  BP:  136/75 119/73   Pulse:  73 76   Temp:  97.5 F (36.4 C) 98.2 F (36.8 C)   TempSrc:      Resp:  20 20   Height:      Weight:      SpO2: 98% 100% 99% 96%    Intake/Output Summary (Last 24 hours) at 08/16/13 1050 Last data filed at 08/16/13 0100  Gross per 24 hour  Intake    480 ml  Output    350 ml  Net    130 ml     Filed Weights   08/13/13 1712 08/14/13 0035  Weight: 86.183 kg (190 lb) 93.169 kg (205 lb  6.4 oz)    Exam:   Afebrile, vital signs stable no hypoxia  General: Appears calm and comfortable.  Cardiovascular: Regular rate and rhythm. No murmur, rub or gallop.  Respiratory: Clear to auscultation bilaterally. No wheezes, rales rhonchi. Normal respiratory effort.  Bilateral lower extremities no edema  Right lower extremity edema has resolved, erythema has resolved. No fluctuance. No tenderness. Foot warm and dry.  Data Reviewed:  No new laboratory studies  Scheduled Meds: . apixaban  2.5 mg Oral BID  . aspirin EC  81 mg Oral Daily  . carvedilol  3.125 mg Oral BID  . docusate sodium  100 mg Oral BID  . folic acid  1 mg Oral Daily  . guaiFENesin  600 mg Oral BID  . ipratropium  0.5 mg Nebulization TID  . levalbuterol  0.63 mg Nebulization TID  . levETIRAcetam  250 mg Oral BID  . levofloxacin (LEVAQUIN) IV  750 mg Intravenous Q48H  . levothyroxine  100 mcg Oral QAC breakfast  . nystatin   Topical TID  . predniSONE  40 mg Oral Q breakfast  . simvastatin  40 mg Oral QHS  . sodium chloride  3 mL Intravenous Q12H  . torsemide  30 mg Oral Daily   Continuous Infusions:    Principal Problem:   Acute bronchitis Active Problems:   HYPOTHYROIDISM   TOBACCO ABUSE   CORONARY ARTERY DISEASE   COPD   SEIZURE DISORDER   Atrial fibrillation   Acute diarrhea   Cellulitis of  lower leg   UTI (lower urinary tract infection)

## 2013-08-16 NOTE — Progress Notes (Signed)
ANTIBIOTIC CONSULT NOTE-follow up  Pharmacy Consult for Levofloxacin  Indication: Bronchitis/cellulitis   No Known Allergies  Patient Measurements: Height: 5\' 8"  (172.7 cm) Weight: 205 lb 6.4 oz (93.169 kg) IBW/kg (Calculated) : 63.9  Vital Signs: Temp: 98.2 F (36.8 C) (01/03 0553) BP: 119/73 mmHg (01/03 0553) Pulse Rate: 76 (01/03 0553)  Labs:  Recent Labs  08/13/13 1735 08/14/13 0609 08/15/13 0511  WBC 4.4 3.7*  --   HGB 13.8 12.6  --   PLT 186 162  --   CREATININE 1.45* 1.27* 1.01   Estimated Creatinine Clearance: 42.4 ml/min (by C-G formula based on Cr of 1.01).  No results found for this basename: VANCOTROUGH, Leodis Binet, VANCORANDOM, GENTTROUGH, GENTPEAK, GENTRANDOM, TOBRATROUGH, TOBRAPEAK, TOBRARND, AMIKACINPEAK, AMIKACINTROU, AMIKACIN,  in the last 72 hours   Microbiology: Recent Results (from the past 720 hour(s))  URINE CULTURE     Status: None   Collection Time    08/13/13  6:50 PM      Result Value Range Status   Specimen Description URINE, CLEAN CATCH   Final   Special Requests NONE   Final   Culture  Setup Time     Final   Value: 08/14/2013 22:45     Performed at [facility] Count     Final   Value: >=100,000 COLONIES/ML     Performed at [facility]   Culture     Final   Value: Multiple bacterial morphotypes present, none predominant. Suggest appropriate recollection if clinically indicated.     Performed at [facility]   Report Status 08/15/2013 FINAL   Final   Medical History: Past Medical History  Diagnosis Date  . Myocardial infarction     history of  . Coronary artery disease   . Leg edema     left  . Aneurysm     hx of  . COPD (chronic obstructive pulmonary disease)   . Hypertension   . Pulmonary nodule   . Obesity   . Anxiety   . Depression   . Hemoptysis   . Pressure ulcer of back     lower  . Tobacco abuse   . DJD (degenerative joint disease)   . Panic disorder   . Urinary  incontinence   . Seizure disorder   . Ganglion cyst     left wrist  . Hypothyroidism   . Leg edema   . DDD (degenerative disc disease), lumbosacral   . Seizures    Assessment: 80 yo female with productive cough, wheezing, weakness, chills, and diarrhea x 2-3 days. She also has cellulitis of right lower extremity and UTI. Empiric antibiotic coverage with Levaquin and Vancomycin.  SCr has improved since admission. Estimated Creatinine Clearance: 42.4 ml/min (by C-G formula based on Cr of 1.01).  Pt is much improved.  Rocephin 12/31 x 1 Levaquin 1/1 >> Vancomycin 12/31 >> 1/3  Goal of Therapy:  Vancomycin troughs 10-20 mcg/ml Eradication of infection  Plan:  Levaquin 750mg  PO q48hrs Monitor labs, renal fxn, and cultures  CONCERNING: Antibiotic IV to Oral Route Change Policy  RECOMMENDATION: This patient is receiving Levaquin by the intravenous route.  Based on criteria approved by the Pharmacy and Therapeutics Committee, the antibiotic(s) is/are being converted to the equivalent oral dose form(s).  DESCRIPTION: These criteria include:  Patient being treated for a respiratory tract infection, urinary tract infection, cellulitis or clostridium difficile associated diarrhea if on metronidazole  The patient is not neutropenic and does not exhibit  a GI malabsorption state  The patient is eating (either orally or via tube) and/or has been taking other orally administered medications for a least 24 hours  The patient is improving clinically and has a Tmax < 100.5  If you have questions about this conversion, please contact the Pharmacy Department  [x]   [phone]( [phone] )  [facility] []   [phone]( [phone] )  [facility]  []   [phone]( [phone] )  [facility] []   [phone]( [phone] )  [facility]   Sireen Halk, [place]icoScott A, [facility] 08/16/2013,11:12 AM

## 2013-08-16 NOTE — Progress Notes (Signed)
Patient with [facility] be [facility]. Report called to Salomon MastAngie Cowen, LPN. Discharge summary and AVS fax[facility] to [facility]. Discharge packet sent with patient. Patient stable. Patient left with daughter in private vehicle.

## 2013-08-16 NOTE — Discharge Summary (Signed)
Physician Discharge Summary  [name] [id number] DOB: 12/02/1918 DOA: 08/13/2013  PCP: Sara AntisURHAM, KAWANTA, MD  Admit date: 08/13/2013 Discharge date: 08/16/2013  Recommendations for Outpatient Follow-up:  1. Followup with primary care physician after release from skilled nursing facility 2. Followup resolution of acute bronchitis, right lower extremity cellulitis  Discharge Diagnoses:  1. Acute bronchitis 2. Right lower extremity cellulitis 3. Suspected UTI 4. Acute renal failure, dehydration 5. Generalized weakness 6. Diarrhea 7. Atrial fibrillation  Discharge Condition: Improved Disposition: To skilled nursing facility for short-term rehabilitation  Diet recommendation: Heart healthy diet  Filed Weights   08/13/13 1712 08/14/13 0035  Weight: 86.183 kg (190 lb) 93.169 kg (205 lb 6.4 oz)    History of present illness:  80 year old woman presented with cough, wheezing, redness of the right leg and edema. She was admitted for acute bronchitis, cellulitis right lower extremity, UTI.  Hospital Course:  Ms. [name] rapidly improved with treatment for bronchitis with empiric antibiotics and steroids and cellulitis of the right lower extremity has nearly resolved on discharge. Her hospitalization was uncomplicated, patient was seen by physical therapy and short-term rehabilitation was recommended. She is now stable for discharge. Individual issues as below.  1. Acute bronchitis. Appears clinically resolved. 2. Cellulitis right lower extremity. Appears clinically resolved. Complete antibiotics. 3. UTI continue empiric treatment. Urine culture unrevealing. 4. Acute renal failure, dehydration. Resolved with IV fluids. 5. Bilateral lower extremity edema: Chronic per patient. Echo 10/2012 with normal LV function, study could not comment on diastolic function. Resolved. No LE DVT 6. Generalized weakness. Physical therapy consult noted, SNF. 7. Diarrhea. Stool studies could not be  obtained 8. Atrial fibrillation. Stable, continue Eliquis, Coreg. 9. H/o seizure disorder. Continue Keppra.  Consultants: none Procedures: none  Antibiotics:  Vancomycin 12/31 >> 1/2  Levaquin 1/1 >> 1/8 (next dose 1/3 PM)  Discharge Instructions     Medication List    STOP taking these medications       ALPRAZolam 0.25 MG tablet  Commonly known as:  XANAX     diclofenac sodium 1 % Gel  Commonly known as:  VOLTAREN     HYDROcodone-acetaminophen 5-325 MG per tablet  Commonly known as:  NORCO/VICODIN      TAKE these medications       apixaban 2.5 MG Tabs tablet  Commonly known as:  ELIQUIS  Take 1 tablet (2.5 mg total) by mouth 2 (two) times daily.     aspirin EC 81 MG tablet  Take 81 mg by mouth daily.     carvedilol 3.125 MG tablet  Commonly known as:  COREG  Take 3.125 mg by mouth 2 (two) times daily.     folic acid 1 MG tablet  Commonly known as:  FOLVITE  Take 1 mg by mouth daily.     ipratropium 0.02 % nebulizer solution  Commonly known as:  ATROVENT  Take 2.5 mLs (0.5 mg total) by nebulization 3 (three) times daily.     levalbuterol 0.63 MG/3ML nebulizer solution  Commonly known as:  XOPENEX  Take 3 mLs (0.63 mg total) by nebulization 3 (three) times daily.     levETIRAcetam 250 MG tablet  Commonly known as:  KEPPRA  Take 250 mg by mouth 2 (two) times daily.     levofloxacin 750 MG tablet  Commonly known as:  LEVAQUIN  Take 1 tablet (750 mg total) by mouth every other day. Next dose 1/3 in evening. Last dose 1/7.     levothyroxine 100 MCG tablet  Commonly known as:  SYNTHROID, LEVOTHROID  Take 1 tablet (100 mcg total) by mouth daily before breakfast.     nystatin 100000 UNIT/GM Powd  Apply to pannus BID     Olopatadine HCl 0.2 % Soln  Place 1 drop into both eyes daily as needed. 1 drop each eye daily as needed(dry eyes)     potassium chloride 10 MEQ tablet  Commonly known as:  K-DUR  Take 10 mEq by mouth daily. To be taken with Demadex  (Torsemide)     predniSONE 20 MG tablet  Commonly known as:  DELTASONE  1/4-1/6 take 20 mg po daily. 1/7-1/9 take 10 mg po daily then stop.     simvastatin 40 MG tablet  Commonly known as:  ZOCOR  Take 40 mg by mouth at bedtime.     torsemide 20 MG tablet  Commonly known as:  DEMADEX  Take 30 mg by mouth daily. *takes one and one-half tablet (30mg  total) daily       No Known Allergies  The results of significant diagnostics from this hospitalization (including imaging, microbiology, ancillary and laboratory) are listed below for reference.    Significant Diagnostic Studies: Dg Chest 2 View  08/13/2013   CLINICAL DATA:  Cough and weakness with lower extremity swelling several days.  EXAM: CHEST  2 VIEW  COMPARISON:  11/08/2012 and 10/31/2012  FINDINGS: The lungs are adequately inflated without focal consolidation or effusion. There is minimal stable elevation of the left hemidiaphragm. There is moderate stable cardiomegaly. There is mild calcified plaque of the thoracic aorta. There are moderate degenerative changes of the spine.  IMPRESSION: No acute cardiopulmonary disease.  Moderate stable cardiomegaly.   Electronically Signed   By: [name] M.D.   On: 08/13/2013 17:38   US Venous Img Lower Bilateral  08/14/2013   CLINICAL DATA:  Leg swelling  EXAM: Bilateral LOWER EXTREMITY VENOUS DOPPLER ULTRASOUND  TECHNIQUE: Gray-scale sonography with graded compression, as well as color Doppler and duplex ultrasound, were performed to evaluate the deep venous system from the level of the common femoral vein through the popliteal and proximal calf veins. Spectral Doppler was utilized to evaluate flow at rest and with distal augmentation maneuvers.  COMPARISON:  None.  FINDINGS: Thrombus within deep veins:  None visualized.  Compressibility of deep veins:  Normal.  Duplex waveform respiratory phasicity:  Normal.  Duplex waveform response to augmentation:  Normal.  Venous reflux:  None visualized.   Other findings:  None visualized.  IMPRESSION: No deep venous thrombosis of bilateral lower extremities.   Electronically Signed   By: [name]   On: 08/14/2013 11:06    Microbiology: Recent Results (from the past 240 hour(s))  URINE CULTURE     Status: None   Collection Time    08/13/13  6:50 PM      Result Value Range Status   Specimen Description URINE, CLEAN CATCH   Final   Special Requests NONE   Final   Culture  Setup Time     Final   Value: 08/14/2013 22:45     Performed at [facility] Count     Final   Value: >=100,000 COLONIES/ML     Performed at [facility]   Culture     Final   Value: Multiple bacterial morphotypes present, none predominant. Suggest appropriate recollection if clinically indicated.     Performed at [facility]   Report Status 08/15/2013 FINAL   Final  Labs: Basic Metabolic Panel:  Recent Labs Lab 08/13/13 1735 08/14/13 0609 08/15/13 0511  NA 139 140 138  K 4.1 3.4* 4.2  CL 99 102 100  CO2 27 25 25   GLUCOSE 78 70 151*  BUN 34* 29* 27*  CREATININE 1.45* 1.27* 1.01  CALCIUM 9.6 8.7 9.4   Liver Function Tests:  Recent Labs Lab 08/13/13 1735 08/14/13 0609  AST 26 23  ALT 9 7  ALKPHOS 92 79  BILITOT 0.5 0.4  PROT 7.3 6.4  ALBUMIN 3.7 3.2*   CBC:  Recent Labs Lab 08/13/13 1735 08/14/13 0609  WBC 4.4 3.7*  NEUTROABS 1.5*  --   HGB 13.8 12.6  HCT 42.4 39.5  MCV 91.6 92.1  PLT 186 162     Principal Problem:   Acute bronchitis Active Problems:   HYPOTHYROIDISM   TOBACCO ABUSE   CORONARY ARTERY DISEASE   COPD   SEIZURE DISORDER   Atrial fibrillation   Acute diarrhea   Cellulitis of lower leg   UTI (lower urinary tract infection)   Time coordinating discharge: 25 minutes  Signed:  Brendia Sacks, MD [facility] 08/16/2013, 11:56 AM

## 2013-08-28 ENCOUNTER — Other Ambulatory Visit: Payer: Self-pay | Admitting: Family Medicine

## 2013-08-28 NOTE — Telephone Encounter (Signed)
Ok to refill 

## 2013-09-03 ENCOUNTER — Telehealth: Payer: Self-pay | Admitting: *Deleted

## 2013-09-03 ENCOUNTER — Inpatient Hospital Stay: Payer: Medicare PPO | Admitting: Family Medicine

## 2013-09-03 NOTE — Telephone Encounter (Signed)
Pt daughter called wanting to know if pt still had to come in today for hosp followup stated that pt is in nursing facility and their doctor came in a did a work up on her, I went back to talk to Dr. Jeanice Limurham and she said pt can cancel appt, daughter was informed.

## 2013-09-04 ENCOUNTER — Telehealth: Payer: Self-pay | Admitting: *Deleted

## 2013-09-04 NOTE — Telephone Encounter (Signed)
Daughter called stating that pt will be going home from hospital and needs FL2 form filled out for pt to have an aide to come out and help take care of pt.

## 2013-09-04 NOTE — Telephone Encounter (Signed)
Please call [name], FL2 forms are for skilled nursing homes, like were she just came from Does she need the CAP form filled out- please clarify

## 2013-09-04 NOTE — Telephone Encounter (Signed)
I called [place] [facility] and it is the CAP form, she stated they had told her wrong, but its where she can have an Aide to come out and take care of her.

## 2013-09-05 NOTE — Telephone Encounter (Signed)
Okay to complete CAP form , put on my desk

## 2013-09-09 NOTE — Telephone Encounter (Signed)
Place PCS form on provider desk to be signed

## 2013-09-17 ENCOUNTER — Telehealth: Payer: Self-pay | Admitting: *Deleted

## 2013-09-17 NOTE — Telephone Encounter (Signed)
Sara NiemannJaime called from CAP services and I had asked her about the CAP form that was requested, her response was when pt signs up for CAP services the form is mailed to pt address and pt is to bring form to office at next visit and have it filled out and signed and then pt is to mail back the form and goes into process adn once the process is done they will contact pt and proceed with services.

## 2013-09-17 NOTE — Telephone Encounter (Signed)
noted 

## 2013-09-29 ENCOUNTER — Inpatient Hospital Stay: Payer: Medicare PPO | Admitting: Family Medicine

## 2013-10-21 ENCOUNTER — Other Ambulatory Visit: Payer: Self-pay | Admitting: Family Medicine

## 2013-10-23 ENCOUNTER — Telehealth: Payer: Self-pay | Admitting: *Deleted

## 2013-10-23 ENCOUNTER — Other Ambulatory Visit: Payer: Self-pay | Admitting: *Deleted

## 2013-10-23 DIAGNOSIS — I10 Essential (primary) hypertension: Secondary | ICD-10-CM

## 2013-10-23 DIAGNOSIS — R609 Edema, unspecified: Secondary | ICD-10-CM

## 2013-10-23 DIAGNOSIS — J441 Chronic obstructive pulmonary disease with (acute) exacerbation: Secondary | ICD-10-CM

## 2013-10-23 NOTE — Telephone Encounter (Signed)
Ok but NTBS for hosp fu

## 2013-10-23 NOTE — Telephone Encounter (Signed)
Patient just [facility] [place] on [facility].  Call placed to patient daughter to advise that F/U appointment is needed.

## 2013-10-23 NOTE — Telephone Encounter (Signed)
Orders placed.   Referral nurse made aware.

## 2013-10-23 NOTE — Telephone Encounter (Signed)
Received call from Amy, Care Manager with [facility].   Reported that patient was released [facility] [place].   [facility] to be faxed to [phone].   Also advised Amy that patient requires office visit for F/U.   Ok to send order?

## 2013-10-23 NOTE — Telephone Encounter (Signed)
Ok to refill??  Medication not noted on list.

## 2013-10-24 ENCOUNTER — Telehealth: Payer: Self-pay | Admitting: *Deleted

## 2013-10-24 NOTE — Telephone Encounter (Signed)
Message copied by Phillips OdorSIX, CHRISTINA H on Fri Oct 24, 2013 11:26 AM ------      Message from: Harriet MassonOBERTS, CHELSEA N      Created: Fri Oct 24, 2013 11:06 AM      Regarding: [facility]      Contact: [phone]       [facility] stated the [facility] and she is still having swelling in her legs. And would like to speak to you  ------

## 2013-10-24 NOTE — Telephone Encounter (Signed)
[facility], who states that patient has edema to BLE, but no SOB noted.   Requested to have F/U visit.   Appointment not scheduled at this time d.t MD schedule full until April.   MD please advise.

## 2013-10-27 NOTE — Telephone Encounter (Signed)
Appointment scheduled for Friday, 10/31/2013 @ 3:45pm.

## 2013-10-27 NOTE — Telephone Encounter (Signed)
Okay to fill these meds

## 2013-10-27 NOTE — Telephone Encounter (Signed)
Prescription sent to pharmacy.

## 2013-10-27 NOTE — Telephone Encounter (Signed)
Okay to work into schedule ASAP

## 2013-10-31 ENCOUNTER — Ambulatory Visit (INDEPENDENT_AMBULATORY_CARE_PROVIDER_SITE_OTHER): Payer: Medicare PPO | Admitting: Family Medicine

## 2013-10-31 ENCOUNTER — Encounter: Payer: Self-pay | Admitting: Family Medicine

## 2013-10-31 VITALS — BP 134/70 | HR 66 | Temp 98.7°F | Resp 18

## 2013-10-31 DIAGNOSIS — I252 Old myocardial infarction: Secondary | ICD-10-CM

## 2013-10-31 DIAGNOSIS — R609 Edema, unspecified: Secondary | ICD-10-CM

## 2013-10-31 DIAGNOSIS — I4891 Unspecified atrial fibrillation: Secondary | ICD-10-CM

## 2013-10-31 DIAGNOSIS — I10 Essential (primary) hypertension: Secondary | ICD-10-CM

## 2013-10-31 DIAGNOSIS — S40029A Contusion of unspecified upper arm, initial encounter: Secondary | ICD-10-CM

## 2013-10-31 NOTE — Patient Instructions (Addendum)
[facility] to call Increase demadex to 1 pill twice a day with potassium F/U 2 weeks

## 2013-11-01 LAB — BASIC METABOLIC PANEL
BUN: 17 mg/dL (ref 6–23)
CALCIUM: 9.8 mg/dL (ref 8.4–10.5)
CO2: 27 mEq/L (ref 19–32)
CREATININE: 1.07 mg/dL (ref 0.50–1.10)
Chloride: 104 mEq/L (ref 96–112)
Glucose, Bld: 83 mg/dL (ref 70–99)
Potassium: 3.5 mEq/L (ref 3.5–5.3)
Sodium: 143 mEq/L (ref 135–145)

## 2013-11-02 ENCOUNTER — Encounter: Payer: Self-pay | Admitting: Family Medicine

## 2013-11-02 DIAGNOSIS — I252 Old myocardial infarction: Secondary | ICD-10-CM | POA: Insufficient documentation

## 2013-11-02 DIAGNOSIS — S40029A Contusion of unspecified upper arm, initial encounter: Secondary | ICD-10-CM | POA: Insufficient documentation

## 2013-11-02 NOTE — Assessment & Plan Note (Signed)
She is on eliquis, rate controlled

## 2013-11-02 NOTE — Progress Notes (Signed)
Patient ID: Sara LenzRosa B [name], female   DOB: 10/01/1918, 80 y.o.   MRN: [id number]   Subjective:    Patient ID: [name], female    DOB: 01/10/1919, 80 y.o.   MRN: [id number]  Patient presents for F/U and L arm hematoma  patient here to followup recent hospitalization and skilled nursing facility placement. She had myocardial infarction and had CPR in route to hospital. She was then sent to [facility] however she did not want to stay and signed herself out. Initially thought that she was going to live in [place] with her daughter however she still present in [place]. She does not want to move in with any family members and will stay in her own home. She does have physical therapy as well as [facility] nursing set up. She has some of her medications with her today note she has been giving herself her medications and there appears to be some confusion based on the package that she has with her. She was given Lasix in the hospital however she does better with torsemide therefore she has resumed this.  She also noted some bruising on her right upper extremeity today not sure how long it has been present.    Review Of Systems:  GEN- denies fatigue, fever, weight loss,weakness, recent illness HEENT- denies eye drainage, change in vision, nasal discharge, CVS- denies chest pain, palpitations RESP- denies SOB, cough, wheeze ABD- denies N/V, change in stools, abd pain GU- denies dysuria, hematuria, dribbling, incontinence MSK- + joint pain, muscle aches, injury Neuro- denies headache, dizziness, syncope, seizure activity       Objective:    BP 134/70  Pulse 66  Temp(Src) 98.7 F (37.1 C)  Resp 18 GEN- NAD, alert and oriented x3 HEENT- PERRL, EOMI, non injected sclera, pink conjunctiva, MMM, oropharynx clear CVS- irregular rhythm, regular rate, no murmur RESP- CTAB ABD-NABS,soft,NT,ND Skin- 3cm hematoma r- below anti-cubital fossa, small indurated area?  Non tender EXT- 2+ pitting edema to shins Pulses- Radial, unable to palpate        Assessment & Plan:      Problem List Items Addressed This Visit   None    Visit Diagnoses   Essential hypertension, benign    -  Primary    Relevant Medications       pravastatin (PRAVACHOL) 20 MG tablet       metoprolol tartrate (LOPRESSOR) 25 MG tablet       torsemide (DEMADEX) 20 MG tablet    Other Relevant Orders       Basic metabolic panel (Completed)       Note: This dictation was prepared with Dragon dictation along with smaller phrase technology. Any transcriptional errors that result from this process are unintentional.

## 2013-11-02 NOTE — Assessment & Plan Note (Signed)
Ice arm, she is on blood thinner, could have come from blood draws or hitting arm unintentionally

## 2013-11-02 NOTE — Assessment & Plan Note (Signed)
Verified doses of meds, will have [facility] check her meds at home

## 2013-11-02 NOTE — Assessment & Plan Note (Signed)
Increase demadex to 1 tablt twice a day, more for comfort of pt, will expect renal function to worsen some, family to make sure she stays hydrated

## 2013-11-06 ENCOUNTER — Other Ambulatory Visit: Payer: Self-pay | Admitting: *Deleted

## 2013-11-14 ENCOUNTER — Ambulatory Visit (INDEPENDENT_AMBULATORY_CARE_PROVIDER_SITE_OTHER): Payer: Medicare PPO | Admitting: Family Medicine

## 2013-11-14 ENCOUNTER — Encounter: Payer: Self-pay | Admitting: Family Medicine

## 2013-11-14 VITALS — BP 136/80 | HR 68 | Temp 98.1°F | Resp 14 | Ht 68.5 in | Wt 215.0 lb

## 2013-11-14 DIAGNOSIS — R609 Edema, unspecified: Secondary | ICD-10-CM

## 2013-11-14 DIAGNOSIS — S40029A Contusion of unspecified upper arm, initial encounter: Secondary | ICD-10-CM

## 2013-11-14 DIAGNOSIS — E039 Hypothyroidism, unspecified: Secondary | ICD-10-CM

## 2013-11-14 DIAGNOSIS — R600 Localized edema: Secondary | ICD-10-CM

## 2013-11-14 DIAGNOSIS — I10 Essential (primary) hypertension: Secondary | ICD-10-CM

## 2013-11-14 DIAGNOSIS — Z79899 Other long term (current) drug therapy: Secondary | ICD-10-CM

## 2013-11-14 NOTE — Patient Instructions (Signed)
Take the all morning meds and evening medications F/U 3 months

## 2013-11-14 NOTE — Assessment & Plan Note (Signed)
TSH to be rechecked today as she has been taking double doses of Synthroid some mornings that we she's been taking 400 mcg a set of 200 mcg

## 2013-11-14 NOTE — Assessment & Plan Note (Signed)
Her swelling does not improve that she's not taking a water pill as prescribed. She's to take 20 mg twice a day and she also has potassium. I did recheck her renal function and her TSH today

## 2013-11-14 NOTE — Assessment & Plan Note (Signed)
The hematoma is now resolved she does have some residual superficial bruising which will improve in the next couple weeks.

## 2013-11-14 NOTE — Progress Notes (Signed)
Patient ID: Sara LenzRosa B [name], female   DOB: 02/10/1919, 80 y.o.   MRN: [id number]   Subjective:    Patient ID: [name], female    DOB: 02/03/1919, 80 y.o.   MRN: [id number]  Patient presents for 2 week F/u  patient here for interim followup. She was seen 2 weeks ago at that time increase her edema DEXA one tablet twice a day. Her legs are still swollen and her daughter thinks that she is gaining weight but we did not have a weight from the last visit. She's not had any chest pain or shortness of breath. Of note she had [facility] nurse come out and noted that her medicines were all mixed up and that she only been putting out the a.m. packet which me she was taken double doses of thyroid medication. She did not use any of her p.m. medications at all. Patient states that sometimes she puts them out and does not take all the pills and she does not think her water pill was doing anything so she did not been taken out of a regular basis. I called her pharmacy it had multiple difficulties with her trying to keep her cast with her medications. The veins homecare nurse actually brought in a grocery bag of multiple pill bottles in which she had vitamin supplements in her Keppra bottle, and other heart medications in her diuretic bottle. Said no one is sure what she's been actually taking on a regular basis  She is here with daughter [name]    Review Of Systems:  GEN- denies fatigue, fever, weight loss,weakness, recent illness HEENT- denies eye drainage, change in vision, nasal discharge, CVS- denies chest pain, palpitations RESP- denies SOB, cough, wheeze ABD- denies N/V, change in stools, abd pain MSK- + joint pain, muscle aches, injury Neuro- denies headache, dizziness, syncope, seizure activity       Objective:    BP 136/80  Pulse 68  Temp(Src) 98.1 F (36.7 C) (Oral)  Resp 14  Ht 5' 8.5" (1.74 m)  Wt 215 lb (97.523 kg)  BMI 32.21 kg/m2 GEN- NAD, alert and oriented x3, sitting  in wheelchair HEENT- PERRL, EOMI, non injected sclera, pink conjunctiva, MMM, oropharynx clear CVS- irregular rhythm, regular rate, no murmur RESP- CTAB ABD-NABS,soft,NT,ND Skin- ecchymosis at previous hematoma, no induration,NT EXT- 2+ pitting edema to shins Pulses- Radial 2+, unable to palpate DP    Assessment & Plan:      Problem List Items Addressed This Visit   HYPOTHYROIDISM - Primary   Relevant Orders      TSH    Other Visit Diagnoses   Essential hypertension, benign        Relevant Orders       Basic metabolic panel       Note: This dictation was prepared with Dragon dictation along with smaller phrase technology. Any transcriptional errors that result from this process are unintentional.

## 2013-11-14 NOTE — Assessment & Plan Note (Signed)
I spent a significant time on the phone with her pharmacist try to sort out her medications in nature they were packaged properly. Due to her taking her medications incorrectly she will actually run out of her a.m. medications the week early the pharmacy has working out so she will be to get this on Monday which is be a little bit later in the afternoon due to insurance purposes. I discussed with her daughter in office today the importance of someone coming to the home given her her the morning and evening medications that she can no longer dispense her medications. They both voiced understanding.

## 2013-11-15 LAB — TSH: TSH: 0.314 u[IU]/mL — AB (ref 0.350–4.500)

## 2013-11-15 LAB — BASIC METABOLIC PANEL
BUN: 18 mg/dL (ref 6–23)
CO2: 24 mEq/L (ref 19–32)
Calcium: 9.5 mg/dL (ref 8.4–10.5)
Chloride: 102 mEq/L (ref 96–112)
Creat: 1.15 mg/dL — ABNORMAL HIGH (ref 0.50–1.10)
Glucose, Bld: 97 mg/dL (ref 70–99)
POTASSIUM: 3.7 meq/L (ref 3.5–5.3)
SODIUM: 139 meq/L (ref 135–145)

## 2013-11-20 ENCOUNTER — Telehealth: Payer: Self-pay | Admitting: *Deleted

## 2013-11-20 NOTE — Telephone Encounter (Signed)
Received call from [name], [facility]- [phone].   Requested to have MD call to discuss family care of patient.   States that patient is not weighing daily, even though she has increased edema and is taking fluid pill. Reports that she has family present in shifts to help care for patient, and they are going to start weighing her.   Also reports that patient daughter [name] is telling patient that she does not need medication and to not take it if she feels bad.   Requested to have MD return call to care manager and discuss situation.   MD to be made aware.

## 2013-11-21 NOTE — Telephone Encounter (Signed)
LVM for Raynelle FanningJulie to return call to discuss patient

## 2013-12-09 ENCOUNTER — Telehealth: Payer: Self-pay | Admitting: *Deleted

## 2013-12-09 ENCOUNTER — Encounter: Payer: Self-pay | Admitting: Family Medicine

## 2013-12-09 ENCOUNTER — Ambulatory Visit (INDEPENDENT_AMBULATORY_CARE_PROVIDER_SITE_OTHER): Payer: Medicare PPO | Admitting: Family Medicine

## 2013-12-09 VITALS — BP 136/78 | HR 78 | Temp 97.5°F | Resp 18 | Wt 205.0 lb

## 2013-12-09 DIAGNOSIS — R5383 Other fatigue: Secondary | ICD-10-CM

## 2013-12-09 DIAGNOSIS — R300 Dysuria: Secondary | ICD-10-CM

## 2013-12-09 DIAGNOSIS — I4891 Unspecified atrial fibrillation: Secondary | ICD-10-CM

## 2013-12-09 DIAGNOSIS — R5381 Other malaise: Secondary | ICD-10-CM

## 2013-12-09 DIAGNOSIS — R600 Localized edema: Secondary | ICD-10-CM

## 2013-12-09 DIAGNOSIS — R609 Edema, unspecified: Secondary | ICD-10-CM

## 2013-12-09 DIAGNOSIS — N390 Urinary tract infection, site not specified: Secondary | ICD-10-CM

## 2013-12-09 DIAGNOSIS — R531 Weakness: Secondary | ICD-10-CM

## 2013-12-09 LAB — URINALYSIS, ROUTINE W REFLEX MICROSCOPIC
Bilirubin Urine: NEGATIVE
Glucose, UA: NEGATIVE mg/dL
Ketones, ur: NEGATIVE mg/dL
NITRITE: NEGATIVE
PROTEIN: NEGATIVE mg/dL
Specific Gravity, Urine: 1.01 (ref 1.005–1.030)
Urobilinogen, UA: 0.2 mg/dL (ref 0.0–1.0)
pH: 5.5 (ref 5.0–8.0)

## 2013-12-09 LAB — URINALYSIS, MICROSCOPIC ONLY: Crystals: NONE SEEN

## 2013-12-09 MED ORDER — FLUCONAZOLE 150 MG PO TABS: 150.0000 mg | ORAL_TABLET | Freq: Once | ORAL | Status: DC

## 2013-12-09 MED ORDER — CIPROFLOXACIN HCL 500 MG PO TABS: 500.0000 mg | ORAL_TABLET | Freq: Two times a day (BID) | ORAL | Status: DC

## 2013-12-09 MED ORDER — TORSEMIDE 20 MG PO TABS: ORAL_TABLET | ORAL | Status: DC

## 2013-12-09 NOTE — Telephone Encounter (Signed)
Spoke with pt daughter, having weakness, increased HR per report, more swelling- no CP, no falls, no dizziness, they do not want to go to ER if possible Requested a new referral to cardiology Will bring her in at 4:30pm

## 2013-12-09 NOTE — Telephone Encounter (Signed)
Received call from patient daughter, Tyrell Antonioarlene.   States that patient has increased edema and her heart beat is a bit irregular.   Requested referral to cardiologist.   Rip Harbourk to order?

## 2013-12-09 NOTE — Patient Instructions (Addendum)
Demadex will be changed to 2 tablets in morning 1 tablet in evening for the next week to pull fluid Referral to heart doctor to be done  Urine infection take antibiotics F/U as previous

## 2013-12-10 DIAGNOSIS — N390 Urinary tract infection, site not specified: Secondary | ICD-10-CM | POA: Insufficient documentation

## 2013-12-10 LAB — COMPREHENSIVE METABOLIC PANEL
AST: 15 U/L (ref 0–37)
Albumin: 4 g/dL (ref 3.5–5.2)
Alkaline Phosphatase: 108 U/L (ref 39–117)
BUN: 28 mg/dL — ABNORMAL HIGH (ref 6–23)
CALCIUM: 9.7 mg/dL (ref 8.4–10.5)
CO2: 29 meq/L (ref 19–32)
CREATININE: 1.31 mg/dL — AB (ref 0.50–1.10)
Chloride: 101 mEq/L (ref 96–112)
Glucose, Bld: 93 mg/dL (ref 70–99)
Potassium: 3.8 mEq/L (ref 3.5–5.3)
Sodium: 139 mEq/L (ref 135–145)
TOTAL PROTEIN: 6.4 g/dL (ref 6.0–8.3)
Total Bilirubin: 0.7 mg/dL (ref 0.2–1.2)

## 2013-12-10 LAB — CBC WITH DIFFERENTIAL/PLATELET
Basophils Absolute: 0.1 10*3/uL (ref 0.0–0.1)
Basophils Relative: 1 % (ref 0–1)
EOS PCT: 5 % (ref 0–5)
Eosinophils Absolute: 0.3 10*3/uL (ref 0.0–0.7)
HEMATOCRIT: 39.9 % (ref 36.0–46.0)
Hemoglobin: 13 g/dL (ref 12.0–15.0)
Lymphocytes Relative: 30 % (ref 12–46)
Lymphs Abs: 1.7 10*3/uL (ref 0.7–4.0)
MCH: 28.7 pg (ref 26.0–34.0)
MCHC: 32.6 g/dL (ref 30.0–36.0)
MCV: 88.1 fL (ref 78.0–100.0)
Monocytes Absolute: 1.2 10*3/uL — ABNORMAL HIGH (ref 0.1–1.0)
Monocytes Relative: 20 % — ABNORMAL HIGH (ref 3–12)
Neutro Abs: 2.6 10*3/uL (ref 1.7–7.7)
Neutrophils Relative %: 44 % (ref 43–77)
PLATELETS: 285 10*3/uL (ref 150–400)
RBC: 4.53 MIL/uL (ref 3.87–5.11)
RDW: 13.8 % (ref 11.5–15.5)
WBC: 5.8 10*3/uL (ref 4.0–10.5)

## 2013-12-10 NOTE — Assessment & Plan Note (Addendum)
He has chronic edema which is unchanged her legs actually look a low bit better today than typically. Since she did have some shortness of breath and her swelling has been up and down per her home health nurse causing her discomfort I will change her demadex 40 mg in the morning and 20 mg in the evening to for 1 week. Her daughter will give her the medications as prescribed as her meds are currently prepackaged due to  polypharmacy and difficulty with understanding.

## 2013-12-10 NOTE — Assessment & Plan Note (Signed)
She's currently in A. fib but she has been in the past 3 times last seen her she is on blood thinner and is rate controlled. I will not change her metoprolol for heart rate is already in the 70s. She did miss her followup with cardiology therefore I will do a new referral

## 2013-12-10 NOTE — Assessment & Plan Note (Signed)
Will start Cipro will also give her Diflucan as he's noted in her urine sample as well

## 2013-12-10 NOTE — Progress Notes (Signed)
Patient ID: Sara LenzRosa B Comrie, female   DOB: 03/24/1919, 80 y.o.   MRN: [id number]   Subjective:    Patient ID: Sara Lenzosa B Bartleson, female    DOB: 07/21/1919, 80 y.o.   MRN: [id number]  Patient presents for edema and weakness  patient here with weakness worse over the past week. Her home health nurse has been coming out to check on her and noticed that her legs will swell up significantly didn't go down. Per her daughter she has been taking her medications as prescribed in the new package directions that we spent for possibly 30 minutes on at the last visit trying to get everything set up for the family correctly and she's been taking the wrong doses of medicines. She states that she gets short of breath if she tries to do some activities but she denies any chest pain. She also noted that her heart rate was irregular but this is not new and she has atrial fibrillation. She did miss her appointment with cardiology and needs a new referral. She also complains of some burning with urination and feeling like she only troubles at times. Even though she is taking a water pill   Review Of Systems:  GEN- + fatigue, fever, weight loss,weakness, recent illness HEENT- denies eye drainage, change in vision, nasal discharge, CVS- denies chest pain, palpitations RESP- +SOB, cough, wheeze ABD- denies N/V, change in stools, abd pain GU- +dysuria, hematuria, dribbling, incontinence MSK- + joint pain, muscle aches, injury Neuro- denies headache, dizziness, syncope, seizure activity       Objective:    BP 136/78  Pulse 78  Temp(Src) 97.5 F (36.4 C) (Oral)  Resp 18  Wt 205 lb (92.987 kg) GEN- NAD, alert and oriented x3, sitting in wheelchair, non toxic appearing HEENT- PERRL, EOMI, non injected sclera, pink conjunctiva, MMM, oropharynx clear CVS- irregular rhythm, regular rate, no murmur RESP- CTAB ABD-NABS,soft,NT,ND, no CVA tenderness EXT- 1+ pitting edema to shins Pulses- Radial 2+, unable to palpate  DP       Assessment & Plan:      Problem List Items Addressed This Visit   UTI (urinary tract infection)     Will start Cipro will also give her Diflucan as he's noted in her urine sample as well    Relevant Medications      fluconazole (DIFLUCAN) tablet 150 mg   Peripheral edema     He has chronic edema which is unchanged. Since she did have some shortness of breath and her swelling has been up and down per her home health nurse causing her discomfort I will change her demadex 40 mg in the morning and 20 mg in the evening to for 1 week. Her daughter will give her the medications as prescribed as her meds are currently prepackaged due to  polypharmacy and difficulty with understanding.    Atrial fibrillation     She's currently in A. fib but she has been in the past 3 times last seen her she is on blood thinner and is rate controlled. I will not change her metoprolol for heart rate is already in the 70s. She did miss her followup with cardiology therefore I will do a new referral    Relevant Medications      torsemide (DEMADEX) tablet    Other Visit Diagnoses   Weakness    -  Primary    Relevant Orders       CBC with Differential (Completed)       Comprehensive  metabolic panel (Completed)       Urinalysis, Routine w reflex microscopic (Completed)    Dysuria        Relevant Orders       Urinalysis, Routine w reflex microscopic (Completed)       Note: This dictation was prepared with Dragon dictation along with smaller phrase technology. Any transcriptional errors that result from this process are unintentional.

## 2013-12-11 ENCOUNTER — Other Ambulatory Visit: Payer: Self-pay | Admitting: Family Medicine

## 2013-12-16 ENCOUNTER — Ambulatory Visit (INDEPENDENT_AMBULATORY_CARE_PROVIDER_SITE_OTHER): Payer: Commercial Managed Care - HMO | Admitting: Cardiology

## 2013-12-16 ENCOUNTER — Telehealth: Payer: Self-pay | Admitting: *Deleted

## 2013-12-16 ENCOUNTER — Encounter: Payer: Self-pay | Admitting: Cardiology

## 2013-12-16 VITALS — BP 109/65 | HR 68 | Ht 68.0 in | Wt 198.0 lb

## 2013-12-16 DIAGNOSIS — I4891 Unspecified atrial fibrillation: Secondary | ICD-10-CM

## 2013-12-16 DIAGNOSIS — R600 Localized edema: Secondary | ICD-10-CM

## 2013-12-16 DIAGNOSIS — R609 Edema, unspecified: Secondary | ICD-10-CM

## 2013-12-16 MED ORDER — AZITHROMYCIN 250 MG PO TABS: ORAL_TABLET | ORAL | Status: DC

## 2013-12-16 MED ORDER — OLOPATADINE HCL 0.2 % OP SOLN: OPHTHALMIC | Status: DC

## 2013-12-16 NOTE — Telephone Encounter (Signed)
Call placed to patient grand-daughter. No answer.

## 2013-12-16 NOTE — Telephone Encounter (Signed)
Prescription sent to pharmacy.  LMTRC ?

## 2013-12-16 NOTE — Telephone Encounter (Signed)
Received call from [name], [facility]   Reports that patient has productive cough with yellow mucus noted. Reports that no fever noted, and patient denies sore throat or sinus drainage.   Nurse states that she can't hear anything upon ausculation, but cough is noted and patient states she feels horrible.   Also reported that patient has not taken extra torsemide, that medication is still in bottle. Also states that patient is not weighing daily as requested.    Patient hs appointment with cardiologist this afternoon.

## 2013-12-16 NOTE — Progress Notes (Addendum)
Clinical Summary [name] is a 80 y.o.female seen today as a new patient for the following medical problems.   1. Afib - rate controlled, on anticoag - denies any palpitations - denies any bleeding on anticoag  2. LE edema - prior echo 10/2012 with LVEF 60-65%, indet diastolic function.  - compliant with diuretics, overall reports edema fairly well contorlled  3. Non-obstructive CAD - prior negative stress tests, cath 2007 with nonobstructive disease - denies any current chest pain  4. Cardiac arrest - recent admit to [facility] with resp failure and cardiac arrest, this records are currently not available.  - deneis any recent chest pain. No syncope or presyncope.  - 09/22/13 Echo from that admission: LVEF 60-65%, mod LVH Past Medical History  Diagnosis Date  . Myocardial infarction     history of  . Coronary artery disease   . Leg edema     left  . Aneurysm     hx of  . COPD (chronic obstructive pulmonary disease)   . Hypertension   . Pulmonary nodule   . Obesity   . Anxiety   . Depression   . Hemoptysis   . Pressure ulcer of back     lower  . Tobacco abuse   . DJD (degenerative joint disease)   . Panic disorder   . Urinary incontinence   . Seizure disorder   . Ganglion cyst     left wrist  . Hypothyroidism   . Leg edema   . DDD (degenerative disc disease), lumbosacral   . Seizures      No Known Allergies   Current Outpatient Prescriptions  Medication Sig Dispense Refill  . apixaban (ELIQUIS) 2.5 MG TABS tablet Take 1 tablet (2.5 mg total) by mouth 2 (two) times daily.  60 tablet  6  . aspirin EC 81 MG tablet Take 81 mg by mouth daily.      Marland Kitchen azithromycin (ZITHROMAX) 250 MG tablet Take (2) tablets by mouth on day 1, then take (1) tablet by mouth on days 2-5.  6 tablet  0  . fluconazole (DIFLUCAN) 150 MG tablet Take 1 tablet (150 mg total) by mouth once.  1 tablet  0  . folic acid (FOLVITE) 1 MG tablet Take 1 mg by mouth daily.      Marland Kitchen  HYDROcodone-acetaminophen (NORCO/VICODIN) 5-325 MG per tablet Take 1 tablet by mouth every 6 (six) hours as needed for moderate pain.      . isosorbide mononitrate (IMDUR) 30 MG 24 hr tablet TAKE 1 TABLET BY MOUTH DAILY  30 tablet  0  . levETIRAcetam (KEPPRA) 250 MG tablet Take 250 mg by mouth 2 (two) times daily.      Marland Kitchen levothyroxine (SYNTHROID, LEVOTHROID) 200 MCG tablet Take 200 mcg by mouth daily before breakfast.      . metoprolol tartrate (LOPRESSOR) 25 MG tablet Take 12.5 mg by mouth 2 (two) times daily.      Marland Kitchen PATADAY 0.2 % SOLN USE 1 DROP EACH EYE DAILY AS NEEDED  5 mL  0  . potassium chloride (K-DUR) 10 MEQ tablet Take 10 mEq by mouth daily. To be taken with Demadex (Torsemide)      . pravastatin (PRAVACHOL) 20 MG tablet Take 20 mg by mouth daily.      Marland Kitchen torsemide (DEMADEX) 20 MG tablet 1 tablet BID      . torsemide (DEMADEX) 20 MG tablet Take total of 40mg  in AM and 20mg  in PM for 7  days  7 tablet  0   No current facility-administered medications for this visit.     Past Surgical History  Procedure Laterality Date  . Appendectomy    . Cholecystectomy    . Replacement total knee  11/2000    right   . Angioplasty      times 2     No Known Allergies    Family History  Problem Relation Age of Onset  . Heart disease Father      Social History [name] reports that she has been smoking Cigarettes.  She has been smoking about 0.00 packs per day. She has never used smokeless tobacco. [name] reports that she does not drink alcohol.   Review of Systems CONSTITUTIONAL: No weight loss, fever, chills, weakness or fatigue.  HEENT: Eyes: No visual loss, blurred vision, double vision or yellow sclerae.No hearing loss, sneezing, congestion, runny nose or sore throat.  SKIN: No rash or itching.  CARDIOVASCULAR: per HPI RESPIRATORY: No shortness of breath, cough or sputum.  GASTROINTESTINAL: No anorexia, nausea, vomiting or diarrhea. No abdominal pain or blood.    GENITOURINARY: No burning on urination, no polyuria NEUROLOGICAL: No headache, dizziness, syncope, paralysis, ataxia, numbness or tingling in the extremities. No change in bowel or bladder control.  MUSCULOSKELETAL: No muscle, back pain, joint pain or stiffness.  LYMPHATICS: No enlarged nodes. No history of splenectomy.  PSYCHIATRIC: No history of depression or anxiety.  ENDOCRINOLOGIC: No reports of sweating, cold or heat intolerance. No polyuria or polydipsia.  Marland Kitchen.   Physical Examination p 68 bp 109/65 Wt 198 lbs BMI 30 Gen: resting comfortably, no acute distress HEENT: no scleral icterus, pupils equal round and reactive, no palptable cervical adenopathy,  CV: RR, no m/r/g, no JVD Resp: Clear to auscultation bilaterally GI: abdomen is soft, non-tender, non-distended, normal bowel sounds, no hepatosplenomegaly MSK: extremities are warm, no edema.  Skin: warm, no rash Neuro:  no focal deficits Psych: appropriate affect   Diagnostic Studies 10/2012 Echo Study Conclusions  - Left ventricle: The cavity size was moderately reduced. Wall thickness was normal. Systolic function was normal. The estimated ejection fraction was in the range of 60% to 65%. Cannot exclude hypokinesis of the basalinferolateral myocardium. The study is not technically sufficient to allow evaluation of LV diastolic function. Limited images overall - no marked change compared to previous study 2011. - Aortic valve: Poorly visualized. Mildly to moderately calcified annulus. Probably trileaflet; mildly calcified leaflets. Cusp separation was reduced. There was no clearly defined stenosis based on limited images. Mean gradient: 8mm Hg (S). - Mitral valve: Calcified annulus. Mildly thickened, mildly calcified leaflets . Trivial regurgitation. - Left atrium: The atrium was severely dilated. - Right ventricle: The cavity size was mildly dilated. Systolic function was mildly reduced. - Right atrium: Central  venous pressure: 10mm Hg (est). - Tricuspid valve: Mild regurgitation. - Pulmonary arteries: Systolic pressure could not be accurately estimated. - Pericardium, extracardiac: There was no pericardial effusion.   2007 Cath CONCLUSION:  1. Nonobstructive coronary artery disease with irregularities in the mid  and distal LAD, no significant obstruction of circumflex artery, tandem  30% stenosis in the mid-right coronary artery and normal LV function.  2. Ectasia of both iliac arteries.    Assessment and Plan   1. Afib - no current symptoms, continue current meds  2. LE edema - continue current diuretic dosing  3. Non-obstructive CAD - denies any current symptoms - continue risk factor modificaiton  4. Cardiac arrest - unclear  history at this point, [facility] - will request records. Echo from that admission showed overall fairly normal findings, appears inciting event may have been respiratory failure as opposed to primary cardiac event     Antoine PocheJonathan F. Ajooni Karam, M.D., F.A.C.C.   Records made available after our clinic visit. Patient admitted early Feb 2015. She reportedly initially had episode of chest pain after eating lunch on 09/21/13. EMS was called. En route to hospital initial rhythm was afib, she however reportedly went into vfib arrest (per notes the strips were not available for cardiology review) requiring CPR and defibrillation. She was quickly and succesfully resuscitated (defib x1 and CPR for 2 min) and [facility]. She was intubated and admitted to ICU. Upon arrival she was in afib with RVR but no reported acute ischemic changes. Troponin trended up to 3.8. Echo showed normal LVEF 60-65% with no WMAs. She was extubated early on during admission. Possible cath was discussed with patient and family, she however declined and was medically managed for NSTEMI.   Dina RichJonathan Darwin Rothlisberger MD

## 2013-12-16 NOTE — Patient Instructions (Signed)
Continue all current medications. Follow up in  3 months 

## 2013-12-16 NOTE — Telephone Encounter (Signed)
Call placed to patient and patient daughter [name] made aware.

## 2013-12-16 NOTE — Telephone Encounter (Signed)
Call back number to DevolaJulie- [phone]

## 2013-12-16 NOTE — Telephone Encounter (Signed)
Have her take robitussin OTC Send Zpak I will f/u with cardiology

## 2013-12-16 NOTE — Telephone Encounter (Signed)
Message copied by Phillips OdorSIX, Alithia Zavaleta H on Tue Dec 16, 2013  8:24 AM ------      Message from: Malvin JohnsBULLINS, SUSAN S      Created: Tue Dec 16, 2013  7:59 AM       Patients grand daughter is calling to say that she has a really bad cough we can reach her at [phone] or [phone] ------

## 2013-12-26 ENCOUNTER — Telehealth: Payer: Self-pay | Admitting: *Deleted

## 2013-12-26 NOTE — Telephone Encounter (Signed)
[facility] on 12/10/13 no response back, sending again today for authorizaton for [employer] Cardiologist, pending authorization

## 2013-12-30 NOTE — Telephone Encounter (Signed)
Faxed [facility] for status on referral, pending authorization.       

## 2013-12-31 NOTE — Telephone Encounter (Signed)
[facility] with authorization number [id number] to Dr. Dina RichJonathan Branch MD cardilogist , form faxed to that facility.

## 2014-01-06 ENCOUNTER — Telehealth: Payer: Self-pay | Admitting: Family Medicine

## 2014-01-06 MED ORDER — BENZONATATE 100 MG PO CAPS: 100.0000 mg | ORAL_CAPSULE | Freq: Three times a day (TID) | ORAL | Status: AC | PRN

## 2014-01-06 NOTE — Telephone Encounter (Signed)
Pt daughter here, pt still hs some dry cough, no fever, no production, has taken antibiotics from 3 weeks ago, taking robitussin but this is not helping cough as much. Has [facility] coming out this week Will start tessalon, advised if not improving CXR needed

## 2014-01-09 ENCOUNTER — Other Ambulatory Visit: Payer: Self-pay | Admitting: Family Medicine

## 2014-01-09 NOTE — Telephone Encounter (Signed)
Refill appropriate and filled per protocol. 

## 2014-01-29 ENCOUNTER — Telehealth: Payer: Self-pay | Admitting: *Deleted

## 2014-01-29 NOTE — Telephone Encounter (Signed)
Pt daughter came in today wanting to know what your feedback on what options were given to family on mother, she has been in hospital almost two weeks with a stroke and daughter wants to talk to you about the options.   Sara RoyalsEarleen Tinsley [phone]

## 2014-01-29 NOTE — Telephone Encounter (Signed)
Received call from patient daughter, [name]. Reported that patient has sufffered stroke and is currently admitted to [facility].   Requested to know if information has been sent to MD from hospital.   MD to be made aware.

## 2014-01-30 NOTE — Telephone Encounter (Signed)
I spoke with patient's daughter she's [facility] having a massive stroke resulting in paralysis from her face down on the left side she is minimally responsive she's also had a heart attack. She's not eating and drinking therefore they have improved with the subject of putting a feeding tube pain. I had multiple conversations with Ms. [name] is she's never wanted extended treatment for her life. It is noted that she did not want ventilator support as well. Half of her children do not want to put in feeding T. the other half are concerned that she needed. At this point her electrolytes seem to be in balance and therefore they cannot proceed with feeding tube at this time anyway. I discussed my concerns and told her daughter [name] I do not think that she would want the feeding tube that it would not be very beneficial to her and that comfort care was best. She agrees and we'll discuss with the family at [facility] as well as the physicians.

## 2014-02-11 DEATH — deceased

## 2014-02-12 ENCOUNTER — Telehealth: Payer: Self-pay

## 2014-02-12 NOTE — Telephone Encounter (Signed)
Patient died per [place]

## 2014-02-20 ENCOUNTER — Ambulatory Visit: Payer: Medicare PPO | Admitting: Family Medicine

## 2014-03-18 ENCOUNTER — Ambulatory Visit: Payer: Commercial Managed Care - HMO | Admitting: Cardiology

## 2014-06-03 IMAGING — CR DG CHEST 2V
3 series · 3 of 3 positions shown · non-contrast
Comparison: One-view chest x-ray 04/23/2012 and two-view chest x-
ray 09/21/2011.

CLINICAL DATA: Chest pain.  Shortness of breath.  Bilateral lower
extremity edema.  Smoker.  History of coronary artery disease with
MI.

CHEST - 2 VIEW

[view not recorded (1 of 3)]
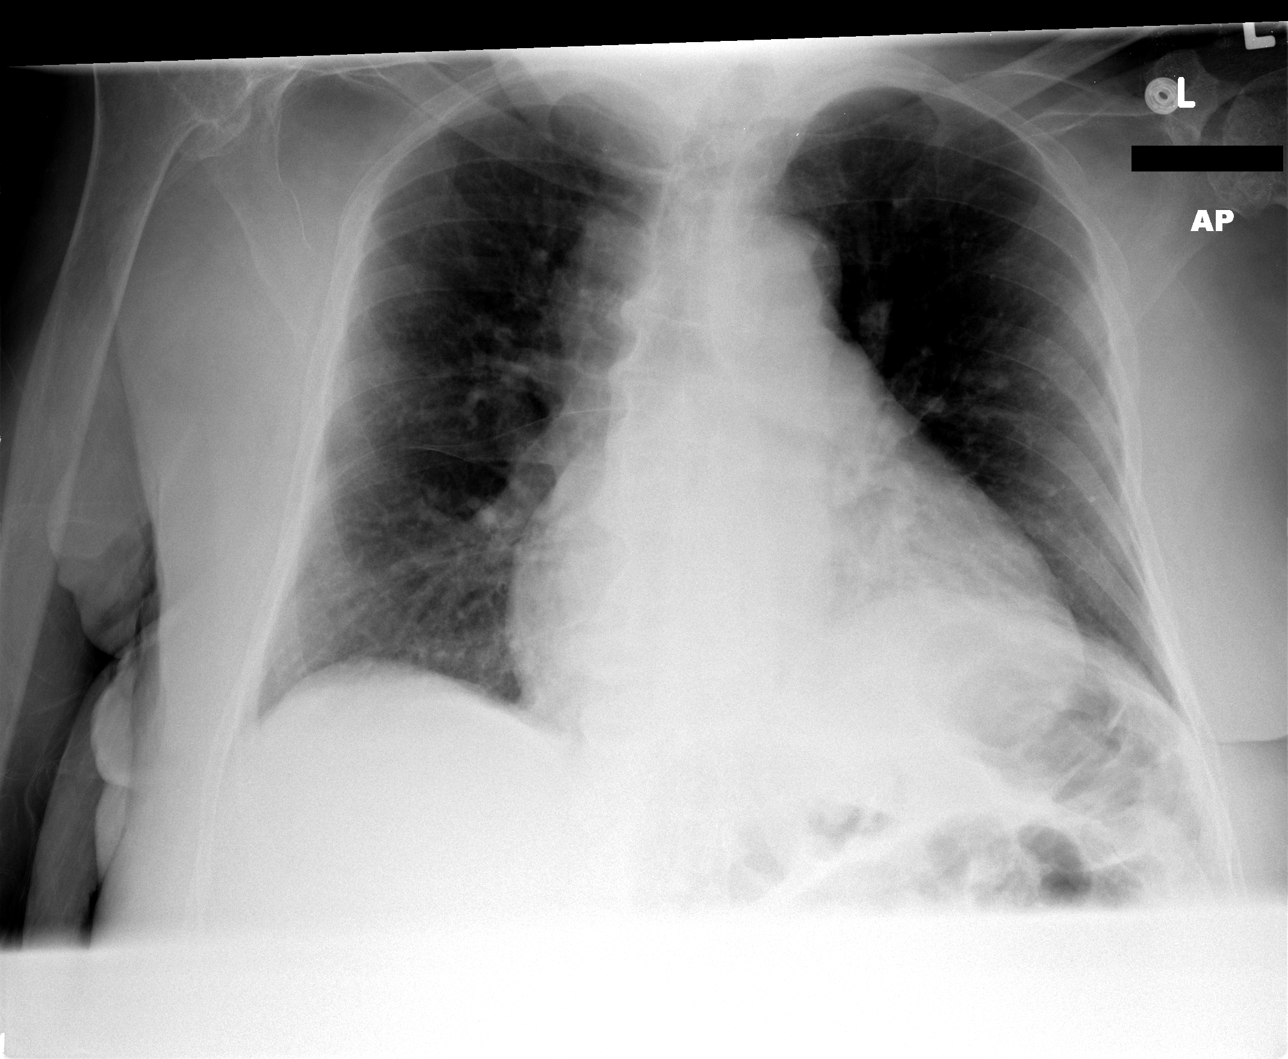

[view not recorded (2 of 3)]
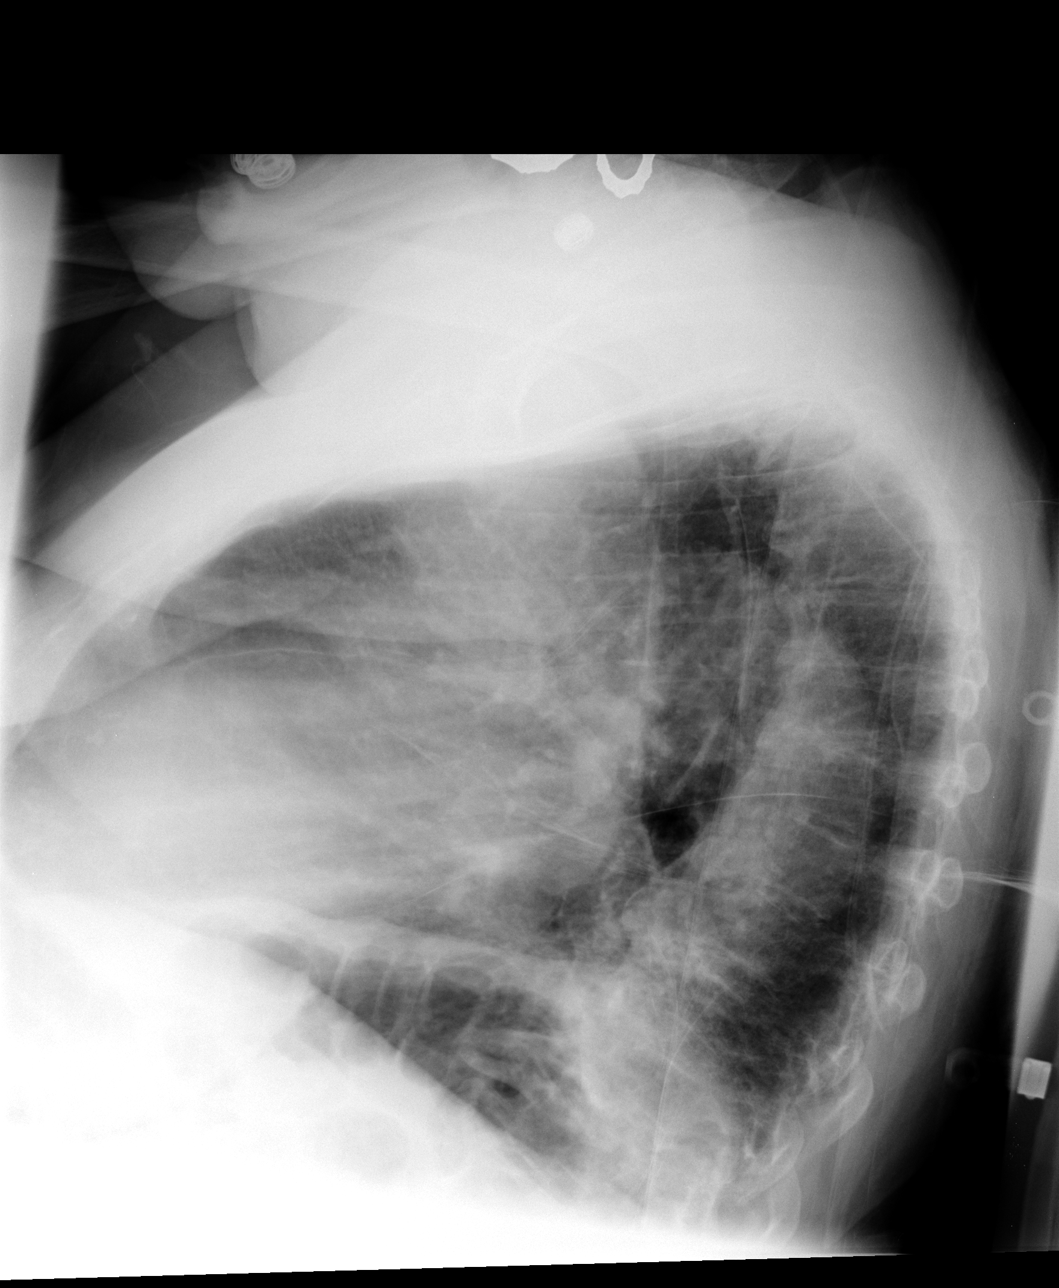

[view not recorded (3 of 3)]
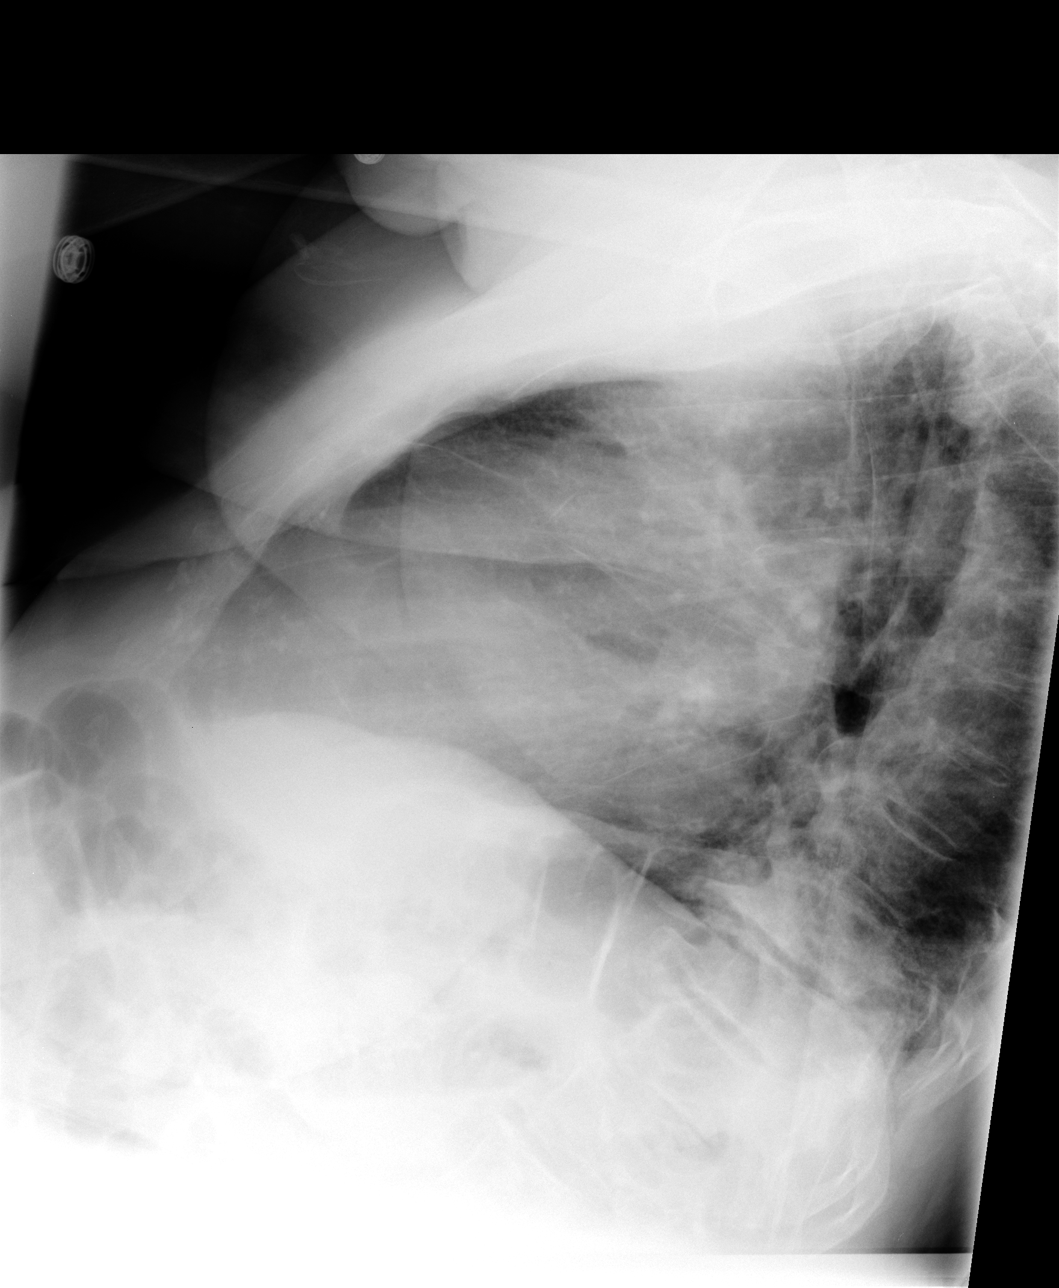

[3 of 3 positions shown; findings below may reference images not displayed]

FINDINGS: Suboptimal inspiration due to body habitus which accounts
for crowded bronchovascular markings at the bases and accentuates
the cardiac silhouette.  There is no account, cardiac silhouette
moderately enlarged but stable.  Pulmonary vascularity normal
without evidence of pulmonary edema.  Consolidation in the left
lower lobe posteriorly.  Lungs otherwise clear.  No visible pleural
effusions.  Elevation of the left hemidiaphragm as noted
previously.  Thoracic aorta atherosclerotic, unchanged.  Hilar and
mediastinal contours otherwise unremarkable. Degenerative changes
and DISH involving the thoracic spine.
IMPRESSION: Stable moderate cardiomegaly without pulmonary edema.  Left lower
lobe atelectasis and/or pneumonia.
